# Patient Record
Sex: Female | Born: 1940 | Race: White | Hispanic: No | Smoking: Never smoker
Health system: Southern US, Community
[De-identification: ages and names within clinical notes are randomized; demographics above are authoritative.]

## PROBLEM LIST (undated history)

## (undated) DIAGNOSIS — I10 Essential (primary) hypertension: Secondary | ICD-10-CM

## (undated) DIAGNOSIS — G459 Transient cerebral ischemic attack, unspecified: Secondary | ICD-10-CM

## (undated) DIAGNOSIS — E785 Hyperlipidemia, unspecified: Secondary | ICD-10-CM

## (undated) DIAGNOSIS — F0390 Unspecified dementia without behavioral disturbance: Secondary | ICD-10-CM

## (undated) DIAGNOSIS — G43909 Migraine, unspecified, not intractable, without status migrainosus: Secondary | ICD-10-CM

## (undated) DIAGNOSIS — F419 Anxiety disorder, unspecified: Secondary | ICD-10-CM

## (undated) DIAGNOSIS — I639 Cerebral infarction, unspecified: Secondary | ICD-10-CM

## (undated) DIAGNOSIS — J189 Pneumonia, unspecified organism: Secondary | ICD-10-CM

## (undated) DIAGNOSIS — K219 Gastro-esophageal reflux disease without esophagitis: Secondary | ICD-10-CM

## (undated) DIAGNOSIS — G43109 Migraine with aura, not intractable, without status migrainosus: Secondary | ICD-10-CM

## (undated) HISTORY — DX: Transient cerebral ischemic attack, unspecified: G45.9

## (undated) HISTORY — PX: OVARIAN CYST REMOVAL: SHX89

## (undated) HISTORY — DX: Cerebral infarction, unspecified: I63.9

## (undated) HISTORY — DX: Gastro-esophageal reflux disease without esophagitis: K21.9

## (undated) HISTORY — DX: Hyperlipidemia, unspecified: E78.5

## (undated) HISTORY — DX: Anxiety disorder, unspecified: F41.9

## (undated) HISTORY — DX: Migraine, unspecified, not intractable, without status migrainosus: G43.909

## (undated) HISTORY — PX: CATARACT EXTRACTION: SUR2

## (undated) HISTORY — PX: ABDOMINAL HYSTERECTOMY: SHX81

## (undated) HISTORY — DX: Migraine with aura, not intractable, without status migrainosus: G43.109

## (undated) HISTORY — PX: NASAL SINUS SURGERY: SHX719

---
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
## 1983-08-05 HISTORY — PX: PARTIAL HYSTERECTOMY: SHX80

## 1998-11-27 ENCOUNTER — Ambulatory Visit (HOSPITAL_COMMUNITY): Admission: RE | Admit: 1998-11-27 | Discharge: 1998-11-27 | Payer: Self-pay | Admitting: Obstetrics & Gynecology

## 2003-08-05 HISTORY — PX: KNEE SURGERY: SHX244

## 2005-01-30 ENCOUNTER — Ambulatory Visit: Payer: Self-pay | Admitting: Internal Medicine

## 2005-08-04 HISTORY — PX: ROTATOR CUFF REPAIR: SHX139

## 2005-09-08 ENCOUNTER — Other Ambulatory Visit: Payer: Self-pay

## 2005-09-08 ENCOUNTER — Ambulatory Visit: Payer: Self-pay | Admitting: Orthopaedic Surgery

## 2005-09-15 ENCOUNTER — Ambulatory Visit: Payer: Self-pay | Admitting: Orthopaedic Surgery

## 2006-03-31 ENCOUNTER — Ambulatory Visit: Payer: Self-pay | Admitting: Internal Medicine

## 2006-07-17 ENCOUNTER — Ambulatory Visit: Payer: Self-pay | Admitting: Orthopaedic Surgery

## 2006-09-08 ENCOUNTER — Ambulatory Visit: Payer: Self-pay | Admitting: Orthopaedic Surgery

## 2006-09-08 ENCOUNTER — Other Ambulatory Visit: Payer: Self-pay

## 2006-09-14 ENCOUNTER — Ambulatory Visit: Payer: Self-pay | Admitting: Orthopaedic Surgery

## 2006-11-25 ENCOUNTER — Ambulatory Visit: Payer: Self-pay | Admitting: Orthopaedic Surgery

## 2006-12-18 ENCOUNTER — Ambulatory Visit: Payer: Self-pay | Admitting: Unknown Physician Specialty

## 2007-04-02 ENCOUNTER — Ambulatory Visit: Payer: Self-pay | Admitting: Internal Medicine

## 2007-05-07 ENCOUNTER — Ambulatory Visit: Payer: Self-pay | Admitting: Unknown Physician Specialty

## 2007-06-15 ENCOUNTER — Ambulatory Visit: Payer: Self-pay | Admitting: Unknown Physician Specialty

## 2007-06-29 ENCOUNTER — Ambulatory Visit: Payer: Self-pay | Admitting: Unknown Physician Specialty

## 2007-07-07 ENCOUNTER — Ambulatory Visit: Payer: Self-pay | Admitting: Urology

## 2007-09-05 ENCOUNTER — Inpatient Hospital Stay: Payer: Self-pay | Admitting: Internal Medicine

## 2007-09-05 ENCOUNTER — Other Ambulatory Visit: Payer: Self-pay

## 2007-09-06 ENCOUNTER — Other Ambulatory Visit: Payer: Self-pay

## 2007-09-07 ENCOUNTER — Other Ambulatory Visit: Payer: Self-pay

## 2007-10-11 ENCOUNTER — Ambulatory Visit: Payer: Self-pay | Admitting: Urology

## 2007-10-20 ENCOUNTER — Ambulatory Visit: Payer: Self-pay | Admitting: Urology

## 2008-04-03 ENCOUNTER — Ambulatory Visit: Payer: Self-pay | Admitting: Internal Medicine

## 2008-05-05 ENCOUNTER — Ambulatory Visit (HOSPITAL_COMMUNITY): Admission: RE | Admit: 2008-05-05 | Discharge: 2008-05-05 | Payer: Self-pay | Admitting: Urology

## 2009-04-05 ENCOUNTER — Ambulatory Visit: Payer: Self-pay | Admitting: Internal Medicine

## 2009-04-25 ENCOUNTER — Ambulatory Visit: Payer: Self-pay | Admitting: Ophthalmology

## 2010-03-22 ENCOUNTER — Ambulatory Visit: Payer: Self-pay | Admitting: Otolaryngology

## 2010-04-05 ENCOUNTER — Ambulatory Visit: Payer: Self-pay | Admitting: Internal Medicine

## 2010-04-11 ENCOUNTER — Ambulatory Visit: Payer: Self-pay | Admitting: Otolaryngology

## 2010-04-22 ENCOUNTER — Ambulatory Visit: Payer: Self-pay | Admitting: Unknown Physician Specialty

## 2010-04-23 ENCOUNTER — Ambulatory Visit: Payer: Self-pay | Admitting: Unknown Physician Specialty

## 2010-04-30 ENCOUNTER — Ambulatory Visit: Payer: Self-pay | Admitting: Internal Medicine

## 2010-11-25 ENCOUNTER — Ambulatory Visit: Payer: Self-pay | Admitting: Otolaryngology

## 2011-01-10 ENCOUNTER — Ambulatory Visit: Payer: Self-pay | Admitting: Internal Medicine

## 2011-01-16 ENCOUNTER — Ambulatory Visit: Payer: Self-pay | Admitting: Internal Medicine

## 2011-01-28 DIAGNOSIS — G454 Transient global amnesia: Secondary | ICD-10-CM | POA: Insufficient documentation

## 2011-01-28 DIAGNOSIS — R42 Dizziness and giddiness: Secondary | ICD-10-CM | POA: Insufficient documentation

## 2011-01-28 DIAGNOSIS — R209 Unspecified disturbances of skin sensation: Secondary | ICD-10-CM | POA: Insufficient documentation

## 2011-01-28 DIAGNOSIS — G43409 Hemiplegic migraine, not intractable, without status migrainosus: Secondary | ICD-10-CM | POA: Insufficient documentation

## 2011-01-28 DIAGNOSIS — H9319 Tinnitus, unspecified ear: Secondary | ICD-10-CM | POA: Insufficient documentation

## 2011-02-10 ENCOUNTER — Other Ambulatory Visit: Payer: Self-pay | Admitting: Neurology

## 2011-02-10 DIAGNOSIS — R42 Dizziness and giddiness: Secondary | ICD-10-CM

## 2011-02-10 DIAGNOSIS — G454 Transient global amnesia: Secondary | ICD-10-CM

## 2011-02-10 DIAGNOSIS — H9319 Tinnitus, unspecified ear: Secondary | ICD-10-CM

## 2011-02-10 DIAGNOSIS — G43409 Hemiplegic migraine, not intractable, without status migrainosus: Secondary | ICD-10-CM

## 2011-02-10 DIAGNOSIS — R209 Unspecified disturbances of skin sensation: Secondary | ICD-10-CM

## 2011-02-12 ENCOUNTER — Ambulatory Visit
Admission: RE | Admit: 2011-02-12 | Discharge: 2011-02-12 | Disposition: A | Discharge status: Home / Self Care | Payer: Medicare Other | Source: Ambulatory Visit | Attending: Neurology | Admitting: Neurology

## 2011-02-12 DIAGNOSIS — G43409 Hemiplegic migraine, not intractable, without status migrainosus: Secondary | ICD-10-CM

## 2011-02-12 DIAGNOSIS — R42 Dizziness and giddiness: Secondary | ICD-10-CM

## 2011-02-12 DIAGNOSIS — G454 Transient global amnesia: Secondary | ICD-10-CM

## 2011-02-12 DIAGNOSIS — H9319 Tinnitus, unspecified ear: Secondary | ICD-10-CM

## 2011-02-12 DIAGNOSIS — R209 Unspecified disturbances of skin sensation: Secondary | ICD-10-CM

## 2011-02-12 MED ORDER — GADOBENATE DIMEGLUMINE 529 MG/ML IV SOLN
17.0000 mL | Freq: Once | INTRAVENOUS | Status: AC | PRN
  Administered 2011-02-12: 17 mL via INTRAVENOUS

## 2011-06-30 ENCOUNTER — Ambulatory Visit: Payer: Self-pay | Admitting: Internal Medicine

## 2011-09-08 ENCOUNTER — Ambulatory Visit: Payer: Self-pay | Admitting: Podiatry

## 2011-12-24 DIAGNOSIS — D106 Benign neoplasm of nasopharynx: Secondary | ICD-10-CM | POA: Insufficient documentation

## 2011-12-24 DIAGNOSIS — J309 Allergic rhinitis, unspecified: Secondary | ICD-10-CM | POA: Insufficient documentation

## 2012-07-06 ENCOUNTER — Ambulatory Visit: Payer: Self-pay | Admitting: Internal Medicine

## 2012-12-17 ENCOUNTER — Emergency Department: Payer: Self-pay | Admitting: Emergency Medicine

## 2012-12-17 ENCOUNTER — Encounter (HOSPITAL_COMMUNITY): Payer: Self-pay

## 2012-12-17 ENCOUNTER — Observation Stay (HOSPITAL_COMMUNITY)
Admission: AD | Admit: 2012-12-17 | Discharge: 2012-12-19 | Disposition: A | Discharge status: Home / Self Care | Payer: Medicare Other | Source: Other Acute Inpatient Hospital | Attending: Internal Medicine | Admitting: Internal Medicine

## 2012-12-17 DIAGNOSIS — E785 Hyperlipidemia, unspecified: Secondary | ICD-10-CM | POA: Insufficient documentation

## 2012-12-17 DIAGNOSIS — I10 Essential (primary) hypertension: Secondary | ICD-10-CM | POA: Diagnosis present

## 2012-12-17 DIAGNOSIS — R29810 Facial weakness: Secondary | ICD-10-CM | POA: Insufficient documentation

## 2012-12-17 DIAGNOSIS — Z79899 Other long term (current) drug therapy: Secondary | ICD-10-CM | POA: Insufficient documentation

## 2012-12-17 DIAGNOSIS — R209 Unspecified disturbances of skin sensation: Secondary | ICD-10-CM | POA: Insufficient documentation

## 2012-12-17 DIAGNOSIS — R4789 Other speech disturbances: Secondary | ICD-10-CM | POA: Insufficient documentation

## 2012-12-17 DIAGNOSIS — R51 Headache: Secondary | ICD-10-CM

## 2012-12-17 DIAGNOSIS — I6350 Cerebral infarction due to unspecified occlusion or stenosis of unspecified cerebral artery: Principal | ICD-10-CM | POA: Insufficient documentation

## 2012-12-17 DIAGNOSIS — R29898 Other symptoms and signs involving the musculoskeletal system: Secondary | ICD-10-CM | POA: Insufficient documentation

## 2012-12-17 DIAGNOSIS — G459 Transient cerebral ischemic attack, unspecified: Secondary | ICD-10-CM | POA: Diagnosis present

## 2012-12-17 DIAGNOSIS — I639 Cerebral infarction, unspecified: Secondary | ICD-10-CM

## 2012-12-17 DIAGNOSIS — R519 Headache, unspecified: Secondary | ICD-10-CM | POA: Diagnosis present

## 2012-12-17 HISTORY — DX: Essential (primary) hypertension: I10

## 2012-12-17 HISTORY — DX: Pneumonia, unspecified organism: J18.9

## 2012-12-17 LAB — URINALYSIS, COMPLETE
Bilirubin,UR: NEGATIVE
Blood: NEGATIVE
Glucose,UR: NEGATIVE mg/dL (ref 0–75)
Leukocyte Esterase: NEGATIVE
Nitrite: NEGATIVE
Protein: NEGATIVE
RBC,UR: <1 /HPF (ref 0–5)
Specific Gravity: 1.006 (ref 1.003–1.030)
WBC UR: <1 /HPF (ref 0–5)

## 2012-12-17 LAB — CBC
HCT: 40.1 % (ref 35.0–47.0)
MCH: 27.5 pg (ref 26.0–34.0)
MCV: 83 fL (ref 80–100)
Platelet: 284 10*3/uL (ref 150–440)
RBC: 4.84 10*6/uL (ref 3.80–5.20)
RDW: 14.5 % (ref 11.5–14.5)
WBC: 7.1 10*3/uL (ref 3.6–11.0)

## 2012-12-17 LAB — COMPREHENSIVE METABOLIC PANEL
Albumin: 3.6 g/dL (ref 3.4–5.0)
Alkaline Phosphatase: 95 U/L (ref 50–136)
BUN: 16 mg/dL (ref 7–18)
Co2: 28 mmol/L (ref 21–32)
Osmolality: 283 (ref 275–301)
Potassium: 3.6 mmol/L (ref 3.5–5.1)
SGPT (ALT): 25 U/L (ref 12–78)
Sodium: 141 mmol/L (ref 136–145)
Total Protein: 6.5 g/dL (ref 6.4–8.2)

## 2012-12-17 LAB — TROPONIN I: Troponin-I: <0.02 ng/mL

## 2012-12-17 NOTE — Consult Note (Signed)
Referring Physician: Dr. Suanne Marker    Chief Complaint: Acute onset of left-sided weakness and numbness on slurred speech.  HPI: Jill Parrish is an 72 y.o. female with a history of hypertension who experienced acute onset of right-sided headache with left arm and leg weakness and numbness along with slurred speech at noon today. Has no previous history of stroke nor TIA. Patient's been taking aspirin daily. Deficits subsided for the most part after 30-60 minutes following onset. She still has slight numbness involving left arm. Speech has returned to baseline. CT scan of her head showed no acute intracranial abnormality. She was seen in the emergency room at [facility] and subsequently transferred here for further evaluation and management.  LSN: 12 noon on 12/17/2012 tPA Given: No: Rapid resolution of deficits MRankin: 0  No past medical history on file.  No family history on file.   Medications:  I have reviewed the patient's current medications. Prior to Admission:  Prescriptions prior to admission  Medication Sig Dispense Refill  . AMLODIPINE BESYLATE PO Take 1 tablet by mouth daily.      . Cyanocobalamin (VITAMIN B-12 PO) Take 1 tablet by mouth daily.      Marland Kitchen Dexlansoprazole (DEXILANT PO) Take 1 tablet by mouth daily.      . Melatonin-Pyridoxine (MELATIN PO) Take 1 tablet by mouth at bedtime.      . Nutritional Supplements (JUICE PLUS FIBRE PO) Take 1 packet by mouth daily.      Marland Kitchen omega-3 acid ethyl esters (LOVAZA) 1 G capsule Take 2 g by mouth 2 (two) times daily.      Marland Kitchen OVER THE COUNTER MEDICATION Apply 1 application topically daily. Name of med (Dicl/bac;/bupi/gaba/ibu/pent) 3% cream apply 1-2 pumps (1 to 2 gms) to affected areas 3 to 4 times daily.      . sodium chloride (OCEAN) 0.65 % SOLN nasal spray Place 1 spray into the nose daily as needed for congestion.      Marland Kitchen telmisartan-hydrochlorothiazide (MICARDIS HCT) 80-12.5 MG per tablet Take 1 tablet by mouth daily.          Physical Examination: Blood pressure 185/74, pulse 66, temperature 97.9 F (36.6 C), temperature source Oral, resp. rate 18, height 5' 7.5" (1.715 m), weight 87.771 kg (193 lb 8 oz), SpO2 98.00%.  Neurologic Examination: Mental Status: Alert, oriented, thought content appropriate.  Speech fluent without evidence of aphasia. Able to follow commands without difficulty. Cranial Nerves: II-Visual fields were normal. III/IV/VI-Pupils were equal and reacted. Extraocular movements were full and conjugate.    V/VII-no facial numbness and no facial weakness. VIII-normal. X-normal speech and symmetrical palatal movement. Motor: 5/5 bilaterally with normal tone and bulk Sensory: Normal throughout. Deep Tendon Reflexes: 2+ and symmetric. Plantars: Flexor bilaterally Cerebellar: Normal finger-to-nose testing. Carotid auscultation: Normal  No results found.  Assessment: 72 y.o. female of hypertension he probably experienced right subcortical TIA. Small vessel subcortical infarction cannot be ruled out however.  Stroke Risk Factors - hypertension  Plan: 1. HgbA1c, fasting lipid panel 2. MRI, MRA  of the brain without contrast 3. PT consult, OT consult, Speech consult 4. Echocardiogram 5. Carotid dopplers 6. Prophylactic therapy-Antiplatelet med: Plavix 75 mg/day 7. Risk factor modification 8. Telemetry monitoring   C.R. Roseanne Reno, MD [facility] [phone]  12/17/2012, 10:45 PM

## 2012-12-18 ENCOUNTER — Encounter (HOSPITAL_COMMUNITY): Payer: Self-pay | Admitting: Internal Medicine

## 2012-12-18 ENCOUNTER — Observation Stay (HOSPITAL_COMMUNITY): Payer: Medicare Other

## 2012-12-18 DIAGNOSIS — G459 Transient cerebral ischemic attack, unspecified: Secondary | ICD-10-CM

## 2012-12-18 DIAGNOSIS — R519 Headache, unspecified: Secondary | ICD-10-CM | POA: Diagnosis present

## 2012-12-18 DIAGNOSIS — I10 Essential (primary) hypertension: Secondary | ICD-10-CM | POA: Diagnosis present

## 2012-12-18 DIAGNOSIS — R51 Headache: Secondary | ICD-10-CM | POA: Diagnosis present

## 2012-12-18 HISTORY — DX: Transient cerebral ischemic attack, unspecified: G45.9

## 2012-12-18 LAB — URINALYSIS, ROUTINE W REFLEX MICROSCOPIC
Glucose, UA: NEGATIVE mg/dL
Hgb urine dipstick: NEGATIVE
Leukocytes, UA: NEGATIVE
Specific Gravity, Urine: 1.019 (ref 1.005–1.030)
Urobilinogen, UA: 0.2 mg/dL (ref 0.0–1.0)

## 2012-12-18 LAB — RAPID URINE DRUG SCREEN, HOSP PERFORMED: Benzodiazepines: NOT DETECTED

## 2012-12-18 LAB — HEMOGLOBIN A1C
Hgb A1c MFr Bld: 6.2 % — ABNORMAL HIGH (ref <5.7)
Mean Plasma Glucose: 131 mg/dL — ABNORMAL HIGH (ref <117)

## 2012-12-18 LAB — CBC
HCT: 39.2 % (ref 36.0–46.0)
Hemoglobin: 13.4 g/dL (ref 12.0–15.0)
MCV: 82.9 fL (ref 78.0–100.0)
RBC: 4.73 MIL/uL (ref 3.87–5.11)
WBC: 8.5 10*3/uL (ref 4.0–10.5)

## 2012-12-18 LAB — CREATININE, SERUM
GFR calc Af Amer: >90 mL/min (ref >90)
GFR calc non Af Amer: 85 mL/min — ABNORMAL LOW (ref >90)

## 2012-12-18 LAB — LIPID PANEL
Cholesterol: 240 mg/dL — ABNORMAL HIGH (ref 0–200)
HDL: 58 mg/dL (ref >39)
Total CHOL/HDL Ratio: 4.1 RATIO
Triglycerides: 224 mg/dL — ABNORMAL HIGH (ref <150)

## 2012-12-18 MED ORDER — ACETAMINOPHEN 325 MG PO TABS
650.0000 mg | ORAL_TABLET | ORAL | Status: DC | PRN
  Administered 2012-12-18 – 2012-12-19 (×2): 650 mg via ORAL
  Filled 2012-12-18 (×2): qty 2

## 2012-12-18 MED ORDER — CLOPIDOGREL BISULFATE 75 MG PO TABS
75.0000 mg | ORAL_TABLET | Freq: Every day | ORAL | Status: DC
  Administered 2012-12-18 – 2012-12-19 (×2): 75 mg via ORAL
  Filled 2012-12-18 (×4): qty 1

## 2012-12-18 MED ORDER — TELMISARTAN-HCTZ 80-12.5 MG PO TABS: 1.0000 | ORAL_TABLET | Freq: Every day | ORAL | Status: DC

## 2012-12-18 MED ORDER — ATORVASTATIN CALCIUM 20 MG PO TABS
20.0000 mg | ORAL_TABLET | Freq: Every day | ORAL | Status: DC
  Administered 2012-12-18: 20 mg via ORAL
  Filled 2012-12-18 (×2): qty 1

## 2012-12-18 MED ORDER — OMEGA-3-ACID ETHYL ESTERS 1 G PO CAPS
2.0000 g | ORAL_CAPSULE | Freq: Two times a day (BID) | ORAL | Status: DC
  Administered 2012-12-18 – 2012-12-19 (×3): 2 g via ORAL
  Filled 2012-12-18 (×4): qty 2

## 2012-12-18 MED ORDER — SALINE SPRAY 0.65 % NA SOLN: 1.0000 | Freq: Every day | NASAL | Status: DC | PRN

## 2012-12-18 MED ORDER — PANTOPRAZOLE SODIUM 40 MG PO TBEC
40.0000 mg | DELAYED_RELEASE_TABLET | Freq: Every day | ORAL | Status: DC
  Administered 2012-12-18 – 2012-12-19 (×2): 40 mg via ORAL
  Filled 2012-12-18 (×2): qty 1

## 2012-12-18 MED ORDER — IRBESARTAN 300 MG PO TABS
300.0000 mg | ORAL_TABLET | Freq: Every day | ORAL | Status: DC
  Administered 2012-12-18 – 2012-12-19 (×2): 300 mg via ORAL
  Filled 2012-12-18 (×2): qty 1

## 2012-12-18 MED ORDER — HYDROCHLOROTHIAZIDE 12.5 MG PO CAPS
12.5000 mg | ORAL_CAPSULE | Freq: Every day | ORAL | Status: DC
  Administered 2012-12-18 – 2012-12-19 (×2): 12.5 mg via ORAL
  Filled 2012-12-18 (×2): qty 1

## 2012-12-18 MED ORDER — HEPARIN SODIUM (PORCINE) 5000 UNIT/ML IJ SOLN
5000.0000 [IU] | Freq: Three times a day (TID) | INTRAMUSCULAR | Status: DC
  Administered 2012-12-18 – 2012-12-19 (×4): 5000 [IU] via SUBCUTANEOUS
  Filled 2012-12-18 (×8): qty 1

## 2012-12-18 NOTE — Progress Notes (Addendum)
[facility]  PROGRESS NOTE   I have seen and examined pt admitted by Dr Conley Rolls this am who is a 71yo with h/o HTN and hyperlipidemia that presented to [facility] ER with slurred speech, left sided weakness, and left facial droop. Head CT at [place] was neg, and pt requested transfer to [facility]. Neuro was consulted, await MRI, echo , carotid dopplers. Continue plavix and current management plan as per Dr Conley Rolls, and follow studies and further treat accordingly.  Kela Millin  [facility]  Pager [phone]. If 7PM-7AM, please contact night-coverage at [email or web address], password [facility]  12/18/2012, 12:52 PM LOS: 1 day

## 2012-12-18 NOTE — Progress Notes (Signed)
UR Completed Chia Rock Graves-Bigelow, RN,BSN [phone]  

## 2012-12-18 NOTE — Progress Notes (Signed)
Patient arrived on the floor via EMS from [facility]. Vital signs checked and recorded. Placed patient on telemetry, pt running NSR. Pt admitting doctor paged. Will await for pt admission orders. Will continue to monitor pt.

## 2012-12-18 NOTE — Progress Notes (Signed)
Physical Therapy Evaluation Patient Details Name: Jill Parrish MRN: [id number] DOB: May 16, 1941 Today's Date: 12/18/2012 Time: 2956-2130 PT Time Calculation (min): 32 min  PT Assessment / Plan / Recommendation Clinical Impression  Pt s/p TIA with decr mobility secondary to decr endurance and slight decr balance.  Overall pt functioning close to baseline per pt.  Pt scored 22/24 on DGI suggesting at low risk of falls.  Daughter came in prior to PT leaving and we discussed risk factors for TIA at length and pt and daughter are interested in getting a new BP monitor for home as well as a Life alert.  Pt and daughter seem to understand information given.  Instructed pt and daughter that pt may want to use cane initially upon d/c in the home and communty for safety until she returns fully to baseline.  Pt and daughter agree that no f/u is needed at this time.  Will f/u Monday if pt still in hospital to ensure pt is still progressing and that she is back to baseline.     PT Assessment  Patient needs continued PT services    Follow Up Recommendations  No PT follow up                Equipment Recommendations  None recommended by PT         Frequency Min 3X/week    Precautions / Restrictions Precautions Precautions: None Restrictions Weight Bearing Restrictions: No   Pertinent Vitals/Pain VSS, No pain      Mobility  Bed Mobility Bed Mobility: Rolling Right;Right Sidelying to Sit Rolling Right: 7: Independent Right Sidelying to Sit: 7: Independent Transfers Transfers: Sit to Stand;Stand to Sit Sit to Stand: 7: Independent;From bed Stand to Sit: 7: Independent;To bed Ambulation/Gait Ambulation/Gait Assistance: 5: Supervision Ambulation Distance (Feet): 360 Feet Assistive device: None Ambulation/Gait Assistance Details: Pt with incr weight shift to right LE with larger swing phase on left LE.  No LOB with challenges with pt self correcting with all challenges given.   Gait  Pattern: Step-through pattern;Decreased stride length;Decreased weight shift to right Stairs: No Wheelchair Mobility Wheelchair Mobility: No Modified Rankin (Stroke Patients Only) Pre-Morbid Rankin Score: No symptoms Modified Rankin: No significant disability         PT Diagnosis: Generalized weakness  PT Problem List: Decreased balance PT Treatment Interventions: Gait training;Balance training   PT Goals Acute Rehab PT Goals PT Goal Formulation: With patient Time For Goal Achievement: 12/25/12 Potential to Achieve Goals: Good Pt will Ambulate: >150 feet;with modified independence;with least restrictive assistive device PT Goal: Ambulate - Progress: Goal set today Additional Goals Additional Goal #1: Pt to score >52/56 on Berg. PT Goal: Additional Goal #1 - Progress: Goal set today  Visit Information  Last PT Received On: 12/18/12 Assistance Needed: +1    Subjective Data  Subjective: "I worked out at [employer] two days a week." Patient Stated Goal: to go home   Prior Functioning  Home Living Lives With: Alone Available Help at Discharge: Family;Available PRN/intermittently (daughter close by and neighbors) Type of Home: House Home Access: Stairs to enter [employer] of Steps: 5 Entrance Stairs-Rails: Left Home Layout: Multi-level;Able to live on main level with bedroom/bathroom Alternate Level Stairs-Number of Steps: 10- to get master bedroom Alternate Level Stairs-Rails: Left Bathroom Shower/Tub: Health visitor: Handicapped height Bathroom Accessibility: Yes How Accessible: Accessible via walker Home Adaptive Equipment: Shower chair with back;Straight cane;Grab bars in shower;Hand-held shower hose Prior Function Level of Independence: Independent Able to  Take Stairs?: Yes Driving: Yes Vocation: Retired Comments: Working out with Psychologist, educational 2x week at [employer]: No difficulties Dominant Hand: Right     Cognition  Cognition Arousal/Alertness: Awake/alert Behavior During Therapy: WFL for tasks assessed/performed Overall Cognitive Status: Within Functional Limits for tasks assessed    Extremity/Trunk Assessment Right Lower Extremity Assessment RLE ROM/Strength/Tone: [facility] for tasks assessed Left Lower Extremity Assessment LLE ROM/Strength/Tone: Deficits LLE ROM/Strength/Tone Deficits: Pt reports she wears an orthotic on the left.  Pt definitely swings left LE through with more force than right.   Trunk Assessment Trunk Assessment: Normal   Balance Dynamic Standing Balance Dynamic Standing - Balance Support: No upper extremity supported;During functional activity Dynamic Standing - Level of Assistance: 5: Stand by assistance Dynamic Standing - Balance Activities: Lateral lean/weight shifting;Forward lean/weight shifting;Reaching for objects Dynamic Standing - Comments: No difficulty with dynamic tasks.  Standardized Balance Assessment Standardized Balance Assessment: Dynamic Gait Index Dynamic Gait Index Level Surface: Normal Change in Gait Speed: Normal Gait with Horizontal Head Turns: Mild Impairment Gait with Vertical Head Turns: Normal Gait and Pivot Turn: Normal Step Over Obstacle: Normal Step Around Obstacles: Normal Steps: Mild Impairment Total Score: 22  End of Session PT - End of Session Equipment Utilized During Treatment: Gait belt Activity Tolerance: Patient limited by fatigue Patient left: in bed;with call bell/phone within reach;with family/visitor present Nurse Communication: Mobility status  GP Functional Assessment Tool Used: Clinical judgment Functional Limitation: Mobility: Walking and moving around Mobility: Walking and Moving Around Current Status (Z6109): At least 1 percent but less than 20 percent impaired, limited or restricted Mobility: Walking and Moving Around Goal Status [phone]): 0 percent impaired, limited or restricted   INGOLD,Verdis Koval 12/18/2012,  4:11 PM [facility] [phone] (pager)

## 2012-12-18 NOTE — Progress Notes (Signed)
Stroke Team Progress Note  HISTORY Jill Parrish is an 72 y.o. female with a history of hypertension who experienced acute onset of right-sided headache with left arm and leg weakness and numbness along with slurred speech at noon today. Has no previous history of stroke nor TIA. Patient's been taking aspirin daily. Deficits subsided for the most part after 30-60 minutes following onset. She still has slight numbness involving left arm. Speech has returned to baseline. CT scan of her head showed no acute intracranial abnormality. She was seen in the emergency room at [facility] and subsequently transferred here for further evaluation and management.   Patient was not a TPA candidate secondary to minimal deficit.   SUBJECTIVE The patient feels better, but she has not returned to her baseline. The patient still has some posterior headache on the right. The patient feels that it is difficult getting the words out, and she still has some residual numbness of the left arm.  OBJECTIVE Most recent Vital Signs: Filed Vitals:   12/17/12 2132 12/17/12 2342 12/18/12 0149 12/18/12 0546  BP: 185/74 156/64 151/65 145/62  Pulse: 66 63 61 70  Temp:  97.5 F (36.4 C) 97.8 F (36.6 C) 97.8 F (36.6 C)  TempSrc: Oral Oral Oral Oral  Resp: 18 18 20 20   Height: 5' 7.5" (1.715 m)     Weight: 193 lb 8 oz (87.771 kg)     SpO2: 98% 97% 97% 99%   CBG (last 3)   Recent Labs  12/18/12 0645  GLUCAP 119*    IV Fluid Intake:     MEDICATIONS  . clopidogrel  75 mg Oral Q breakfast  . heparin  5,000 Units Subcutaneous Q8H  . hydrochlorothiazide  12.5 mg Oral Daily  . irbesartan  300 mg Oral Daily  . omega-3 acid ethyl esters  2 g Oral BID  . pantoprazole  40 mg Oral Daily   PRN:  acetaminophen, sodium chloride  Diet:  Cardiac thin liquids Activity:  Bathroom privileges with assistance DVT Prophylaxis:  Heparin SQ  CLINICALLY SIGNIFICANT STUDIES Basic Metabolic Panel:   Recent  Labs Lab 12/18/12 0234  CREATININE 0.69   Liver Function Tests: No results found for this basename: AST, ALT, ALKPHOS, BILITOT, PROT, ALBUMIN,  in the last 168 hours CBC:   Recent Labs Lab 12/18/12 0234  WBC 8.5  HGB 13.4  HCT 39.2  MCV 82.9  PLT 279   Coagulation: No results found for this basename: LABPROT, INR,  in the last 168 hours Cardiac Enzymes: No results found for this basename: CKTOTAL, CKMB, CKMBINDEX, TROPONINI,  in the last 168 hours Urinalysis: No results found for this basename: COLORURINE, APPERANCEUR, LABSPEC, PHURINE, GLUCOSEU, HGBUR, BILIRUBINUR, KETONESUR, PROTEINUR, UROBILINOGEN, NITRITE, LEUKOCYTESUR,  in the last 168 hours Lipid Panel    Component Value Date/Time   CHOL 240* 12/18/2012 0234   TRIG 224* 12/18/2012 0234   HDL 58 12/18/2012 0234   CHOLHDL 4.1 12/18/2012 0234   VLDL 45* 12/18/2012 0234   LDLCALC 137* 12/18/2012 0234   HgbA1C  No results found for this basename: HGBA1C    Urine Drug Screen:     Component Value Date/Time   LABOPIA NONE DETECTED 12/18/2012 0406   COCAINSCRNUR NONE DETECTED 12/18/2012 0406   LABBENZ NONE DETECTED 12/18/2012 0406   AMPHETMU NONE DETECTED 12/18/2012 0406   THCU NONE DETECTED 12/18/2012 0406   LABBARB NONE DETECTED 12/18/2012 0406    Alcohol Level: No results found for this basename: ETH,  in  the last 168 hours  No results found.  CT of the brain   No acute changes, done at [facility]  MRI of the brain    MRA of the brain    2D Echocardiogram    Carotid Doppler    CXR    EKG    Therapy Recommendations Pending   Physical Exam  General: The patient is alert and cooperative at the time of the examination.  Skin: No significant peripheral edema is noted.   Neurologic Exam  Cranial nerves: Facial symmetry is present. Speech is normal, no aphasia or dysarthria is noted.speech may be slightly slow and deliberate. Extraocular movements are full. Visual fields are full.  Motor: The patient has good  strength in all 4 extremities. No drift is seen.  Coordination: The patient has good finger-nose-finger and heel-to-shin bilaterally.  Gait and station: The gait was not tested. No drift is seen.  Reflexes: Deep tendon reflexes are symmetric.    ASSESSMENT Jill Parrish is a 72 y.o. female presenting with left-sided weakness and numbness, slurred speech. On aspirin 81 mg orally every day prior to admission. Now on clopidogrel 75 mg orally every day for secondary stroke prevention. Patient with resultant mild speech changes, left-sided numbness. Work up underway.   Hypertension  Episode of left-sided numbness, weakness, speech changes, possible right subcortical stroke event  LDL 137  Urine drug screen negative  Hospital day # 1  TREATMENT/PLAN  ContinuePlavix  MRI brain  MRA head  2-D echocardiogram  Carotid Doppler study  Physical and occupational therapy evaluation. The patient will likely not need inpatient or outpatient rehabilitation.  Cholesterol lowering agents   12/18/2012 10:12 AM  Lesly Dukes

## 2012-12-18 NOTE — H&P (Signed)
[facility] History and Physical  Jill Parrish ZOX:[id number] DOB: [birth year]    PCP:   None  Chief Complaint: facial droop, slurred speech, and left sided weakness.  HPI: Jill Parrish is an 72 y.o. female with hx HTN, dyslipidemia on [place], presents to [facility] ER with slurred speech, left sided weakness, and left facial droop.  She had a negative head CT and unremarkable CBC with normal Cr, and was accepted in transferance to [facility] for further stroke work up.  She was seen by Dr Roseanne Reno, who felt that this could represent subcortical TIA, and recommended stroke work up.  During my interview with her, she does have trouble with short term memory, but otherwise having fluent speech and was able to converse meaningfully.  She has no residual weakness.  She had HA with these events as well, as she has hx of migraine HA.  Rewiew of Systems:  Constitutional: Negative for malaise, fever and chills. No significant weight loss or weight gain Eyes: Negative for eye pain, redness and discharge, diplopia, visual changes, or flashes of light. ENMT: Negative for ear pain, hoarseness, nasal congestion, sinus pressure and sore throat. No headaches; tinnitus, drooling, or problem swallowing. Cardiovascular: Negative for chest pain, palpitations, diaphoresis, dyspnea and peripheral edema. ; No orthopnea, PND Respiratory: Negative for cough, hemoptysis, wheezing and stridor. No pleuritic chestpain. Gastrointestinal: Negative for nausea, vomiting, diarrhea, constipation, abdominal pain, melena, blood in stool, hematemesis, jaundice and rectal bleeding.    Genitourinary: Negative for frequency, dysuria, incontinence,flank pain and hematuria; Musculoskeletal: Negative for back pain and neck pain. Negative for swelling and trauma.;  Skin: . Negative for pruritus, rash, abrasions, bruising and skin lesion.; ulcerations Neuro: Negative lightheadedness and neck stiffness. Negative for weakness,  altered level of consciousness , altered mental status, burning feet, involuntary movement, seizure and syncope.  Psych: negative for anxiety, depression, insomnia, tearfulness, panic attacks, hallucinations, paranoia, suicidal or homicidal ideation    Past Medical History  Diagnosis Date  . Hypertension   . Pneumonia     Past Surgical History  Procedure Laterality Date  . Partial hysterectomy    . Ovarian cyst removal    . Tonsillectomy      Medications:  HOME MEDS: Prior to Admission medications   Medication Sig Start Date End Date Taking? Authorizing Provider  AMLODIPINE BESYLATE PO Take 1 tablet by mouth daily.   Yes Historical Provider, MD  Cyanocobalamin (VITAMIN B-12 PO) Take 1 tablet by mouth daily.   Yes Historical Provider, MD  Dexlansoprazole (DEXILANT PO) Take 1 tablet by mouth daily.   Yes Historical Provider, MD  Melatonin-Pyridoxine (MELATIN PO) Take 1 tablet by mouth at bedtime.   Yes Historical Provider, MD  Nutritional Supplements (JUICE PLUS FIBRE PO) Take 1 packet by mouth daily.   Yes Historical Provider, MD  omega-3 acid ethyl esters (LOVAZA) 1 G capsule Take 2 g by mouth 2 (two) times daily.   Yes Historical Provider, MD  OVER THE COUNTER MEDICATION Apply 1 application topically daily. Name of med (Dicl/bac;/bupi/gaba/ibu/pent) 3% cream apply 1-2 pumps (1 to 2 gms) to affected areas 3 to 4 times daily.   Yes Historical Provider, MD  sodium chloride (OCEAN) 0.65 % SOLN nasal spray Place 1 spray into the nose daily as needed for congestion.   Yes Historical Provider, MD  telmisartan-hydrochlorothiazide (MICARDIS HCT) 80-12.5 MG per tablet Take 1 tablet by mouth daily.   Yes Historical Provider, MD     Allergies:  Allergies  Allergen Reactions  .  Sulfa Antibiotics     Swelling,itching     Social History:   reports that she has never smoked. She does not have any smokeless tobacco history on file. She reports that  drinks alcohol. She reports that she does  not use illicit drugs.  Family History: History reviewed. No pertinent family history.   Physical Exam: Filed Vitals:   12/17/12 2132 12/17/12 2342 12/18/12 0149  BP: 185/74 156/64 151/65  Pulse: 66 63 61  Temp:  97.5 F (36.4 C) 97.8 F (36.6 C)  TempSrc: Oral Oral Oral  Resp: 18 18 20   Height: 5' 7.5" (1.715 m)    Weight: 87.771 kg (193 lb 8 oz)    SpO2: 98% 97% 97%   Blood pressure 151/65, pulse 61, temperature 97.8 F (36.6 C), temperature source Oral, resp. rate 20, height 5' 7.5" (1.715 m), weight 87.771 kg (193 lb 8 oz), SpO2 97.00%.  GEN:  Pleasant  patient lying in the stretcher in no acute distress; cooperative with exam. PSYCH:  alert and oriented x4; does not appear anxious or depressed; affect is appropriate. HEENT: Mucous membranes pink and anicteric; PERRLA; EOM intact; no cervical lymphadenopathy nor thyromegaly or carotid bruit; no JVD; There were no stridor. Neck is very supple.  She has a slight left facial droop. Breasts:: Not examined CHEST WALL: No tenderness CHEST: Normal respiration, clear to auscultation bilaterally.  HEART: Regular rate and rhythm.  There are no murmur, rub, or gallops.   BACK: No kyphosis or scoliosis; no CVA tenderness ABDOMEN: soft and non-tender; no masses, no organomegaly, normal abdominal bowel sounds; no pannus; no intertriginous candida. There is no rebound and no distention. Rectal Exam: Not done EXTREMITIES: No bone or joint deformity; age-appropriate arthropathy of the hands and knees; no edema; no ulcerations.  There is no calf tenderness. Genitalia: not examined PULSES: 2+ and symmetric SKIN: Normal hydration no rash or ulceration CNS: Cranial nerves 2-12 grossly intact no focal lateralizing neurologic deficit.  Speech is fluent; uvula elevated with phonation, facial symmetry and tongue midline. DTR are normal bilaterally, cerebella exam is intact, barbinski is negative and strengths are equaled bilaterally.  No sensory  loss.   Labs on Admission:  Basic Metabolic Panel:  Recent Labs Lab 12/18/12 0234  CREATININE 0.69   Liver Function Tests: No results found for this basename: AST, ALT, ALKPHOS, BILITOT, PROT, ALBUMIN,  in the last 168 hours No results found for this basename: LIPASE, AMYLASE,  in the last 168 hours No results found for this basename: AMMONIA,  in the last 168 hours CBC:  Recent Labs Lab 12/18/12 0234  WBC 8.5  HGB 13.4  HCT 39.2  MCV 82.9  PLT 279   Cardiac Enzymes: No results found for this basename: CKTOTAL, CKMB, CKMBINDEX, TROPONINI,  in the last 168 hours  CBG: No results found for this basename: GLUCAP,  in the last 168 hours   Radiological Exams on Admission: No results found.  Assessment/Plan Present on Admission:  . TIA (transient ischemic attack) . HTN (hypertension) . Headache  PLAN:  She likely has had a TIA and less likely a CVA.  Will admit her for work up to include MRI/MRA of the brain along with coratid doppler and ECHO.  She has been on ASA, therefore, will change it over to plavix.  She has HTN, and has been on meds.  These were continued.  Her HA is likely a migraine. She is stable, full code, and will be admitted to [facility] service.  [facility] will follow up as well.  Thank you for allowing me to partake in the care of your nice patient.  Other plans as per orders.  Code Status: FULL Unk Lightning, MD. [facility] Pager [phone] 7pm to 7am.  12/18/2012, 3:55 AM

## 2012-12-19 ENCOUNTER — Other Ambulatory Visit: Payer: Self-pay | Admitting: Physician Assistant

## 2012-12-19 DIAGNOSIS — I6350 Cerebral infarction due to unspecified occlusion or stenosis of unspecified cerebral artery: Secondary | ICD-10-CM

## 2012-12-19 DIAGNOSIS — G459 Transient cerebral ischemic attack, unspecified: Secondary | ICD-10-CM

## 2012-12-19 DIAGNOSIS — R51 Headache: Secondary | ICD-10-CM

## 2012-12-19 DIAGNOSIS — I10 Essential (primary) hypertension: Secondary | ICD-10-CM

## 2012-12-19 LAB — URINE CULTURE: Colony Count: 4000

## 2012-12-19 MED ORDER — CLOPIDOGREL BISULFATE 75 MG PO TABS: 75.0000 mg | ORAL_TABLET | Freq: Every day | ORAL | Status: DC

## 2012-12-19 MED ORDER — ATORVASTATIN CALCIUM 20 MG PO TABS: 20.0000 mg | ORAL_TABLET | Freq: Every day | ORAL | Status: DC

## 2012-12-19 NOTE — Evaluation (Signed)
Occupational Therapy Evaluation Patient Details Name: Jill Parrish MRN: [id number] DOB: September 08, 1940 Today's Date: 12/19/2012 Time: 7846-9629 OT Time Calculation (min): 21 min  OT Assessment / Plan / Recommendation Clinical Impression  Pt admitted with acute onset of right-sided headache with left arm and leg weakness and numbness along with slurred speech. CT scan of her head showed no acute intracranial abnormality. She still has slight numbness involving left arm. Speech has returned to baseline.  Educated pt on stroke signs and symptoms.  Pt has necessary level of assist at home and no further DME needs.  Pt does not need any further acute OT services. Will sign off.    OT Assessment  Patient does not need any further OT services    Follow Up Recommendations  No OT follow up    Barriers to Discharge      Equipment Recommendations  None recommended by OT    Recommendations for Other Services    Frequency       Precautions / Restrictions     Pertinent Vitals/Pain See vitals    ADL  Eating/Feeding: Performed;Independent Where Assessed - Eating/Feeding: Edge of bed Grooming: Performed;Brushing hair;Applying makeup;Wash/dry hands;Modified independent Where Assessed - Grooming: Unsupported standing Upper Body Bathing: Simulated;Independent Where Assessed - Upper Body Bathing: Unsupported sitting Lower Body Bathing: Simulated;Modified independent Where Assessed - Lower Body Bathing: Unsupported sit to stand Upper Body Dressing: Performed;Independent Where Assessed - Upper Body Dressing: Unsupported sitting Lower Body Dressing: Performed;Independent Where Assessed - Lower Body Dressing: Unsupported sit to stand Toilet Transfer: Simulated;Modified independent Toilet Transfer Method:  (ambulating) Acupuncturist:  (bed) Toileting - Clothing Manipulation and Hygiene: Simulated;Independent Where Assessed - Toileting Clothing Manipulation and Hygiene: Sit to stand from  3-in-1 or toilet Equipment Used:  (none) Transfers/Ambulation Related to ADLs: mod I level ambulating in room.  No LOB. ADL Comments: Pt just finishing up with sponge bath on OT arrival.   Pt able to don undergarment independently.  Mod I level for fine motor tasks with LUE due to slightly increased time.  Pt reports she can feel that her LUE is not quite to baseline, but it does not prevent her from completing tasks.  Pt does report she feels very tired since she has been in hospital. Recommended use of shower chair for increased safety when showering at home.     OT Diagnosis:    OT Problem List:   OT Treatment Interventions:     OT Goals    Visit Information  Last OT Received On: 12/19/12    Subjective Data      Prior Functioning     Home Living Lives With: Alone Available Help at Discharge: Family;Available PRN/intermittently Type of Home: House Home Access: Stairs to enter [employer] of Steps: 5 Entrance Stairs-Rails: Left Home Layout: Multi-level;Able to live on main level with bedroom/bathroom Alternate Level Stairs-Number of Steps: 10- to get master bedroom Alternate Level Stairs-Rails: Left Bathroom Shower/Tub: Health visitor: Handicapped height Bathroom Accessibility: Yes How Accessible: Accessible via walker Home Adaptive Equipment: Shower chair with back;Straight cane;Grab bars in shower;Hand-held shower hose Prior Function Level of Independence: Independent Able to Take Stairs?: Yes Driving: Yes Vocation: Retired Comments: working out with Psychologist, educational 2x week at [employer] Communication: No difficulties Dominant Hand: Right         Vision/Perception Vision - History Patient Visual Report: No change from baseline   Cognition  Cognition Arousal/Alertness: Awake/alert Behavior During Therapy: WFL for tasks assessed/performed Overall Cognitive Status: Within  Functional Limits for tasks assessed     Extremity/Trunk Assessment Right Upper Extremity Assessment RUE ROM/Strength/Tone: WFL for tasks assessed RUE Sensation: WFL - Light Touch;WFL - Proprioception RUE Coordination: WFL - gross/fine motor Left Upper Extremity Assessment LUE ROM/Strength/Tone: WFL for tasks assessed LUE Sensation: WFL - Proprioception;Deficits LUE Sensation Deficits: reports LUE is "slightly numb" LUE Coordination: Deficits LUE Coordination Deficits: slightly increased time for fine motor tasks.     Mobility Bed Mobility Bed Mobility: Sit to Supine Sit to Supine: 7: Independent Transfers Transfers: Sit to Stand;Stand to Sit Sit to Stand: 7: Independent;From bed Stand to Sit: 7: Independent;To bed     Exercise     Balance     End of Session OT - End of Session Equipment Utilized During Treatment:  (none) Activity Tolerance: Patient tolerated treatment well Patient left: in bed;with call bell/phone within reach Nurse Communication: Mobility status  GO Functional Assessment Tool Used: clinical judgement Functional Limitation: Self care Self Care Current Status (I6962): 0 percent impaired, limited or restricted Self Care Goal Status (X5284): 0 percent impaired, limited or restricted Self Care Discharge Status [phone]): 0 percent impaired, limited or restricted  12/19/2012 Cipriano Mile OTR/L Pager [phone] Office [phone]  Cipriano Mile 12/19/2012, 10:50 AM

## 2012-12-19 NOTE — Progress Notes (Signed)
  Echocardiogram 2D Echocardiogram has been performed.  Jill Parrish 12/19/2012, 9:27 AM

## 2012-12-19 NOTE — Progress Notes (Signed)
Received request through Dr. Myrtis Ser that neurologist Dr. Pearlean Brownie is requesting 21 day cardiac monitor for patient due to TIA. I have left a message on our office's scheduling voicemail requesting this, and our office will call the patient with arrangements. Per Dr. Graciela Husbands, results of monitor will go to him. Dayna Dunn PA-C

## 2012-12-19 NOTE — Discharge Summary (Signed)
Physician Discharge Summary  Jill Parrish NFA:[id number] DOB: [birth year] DOA: 12/17/2012  PCP: No primary provider on file.  Admit date: 12/17/2012 Discharge date: 12/19/2012  Time spent: >61minutes  Recommendations for Outpatient Follow-up:      Follow-up Information   Please follow up. (PCP/Dr Lamb in 1-2weeks, call for appt upon discharge)       Please follow up. 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 female with hx HTN, dyslipidemia on [place], presents to [facility] ER with slurred speech, left sided weakness, and left facial droop. She had a negative head CT and unremarkable CBC with normal Cr, and was accepted in transferance to [facility] for further stroke work up. She was seen by Dr Roseanne Reno, who felt that this could represent subcortical TIA, and recommended stroke work up. During my interview with her, she does have trouble with short term memory, but otherwise having fluent speech and was able to converse meaningfully. She has no residual weakness. She had HA with these events as well, as she has hx of migraine HA.      Hospital Course:  .  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 Neuro was consulted on admission and impression was that she had Probable Right subcortical TIA (transient ischemic attack), and she was changed to plavix from ASA. The pt's symptoms  improved. Carotid doppler did not show significant ICA stenosis, ECHO was done and results pending at this time. Neuro followed up with pt and recommend D/C  With cardionet  Monitor and to follow up with PCP for echo results. I called  Cards- Dr Myrtis Ser re: cardionet monitoring for 3wks and he states that the office will call pt to set up. She is to follow up with Dr Anne Hahn in 4-6wks.   Marland Kitchen HTN (hypertension) -she is to continue her oupt medications upon discharge . Headache  Relieved by tylenol prn, she is to follow up outpt    Procedures:  Echo pending, follow up with PCP for results  Consultations:  Neurology  Discharge Exam: Filed Vitals:   12/18/12 2213 12/19/12 0221 12/19/12 0540 12/19/12 1100  BP: 170/65 144/80 143/64 135/69  Pulse: 67 74 56 65  Temp: 97.7 F (36.5 C) 97.4 F (36.3 C) 97.7 F (36.5 C) 97.8 F (36.6 C)  TempSrc: Oral Oral Oral Oral  Resp: 18 19 20 20   Height:      Weight:      SpO2: 97% 98% 98% 98%    General: alert and oriented x3, in NAD Cardiovascular: RRRR Respiratory: CTAB Extremities: no edema, no cyanosis Neuro- alert and oriented x3, strength nl, sensory grossly intact  Discharge Instructions  Discharge Orders   Future Orders Complete By Expires     Diet - low sodium heart healthy  As directed     Increase activity slowly  As directed  Medication List    TAKE these medications       AMLODIPINE BESYLATE PO  Take 1 tablet by mouth daily.     atorvastatin 20 MG tablet  Commonly known as:  LIPITOR  Take 1 tablet (20 mg total) by mouth daily at 6 PM.     clopidogrel 75 MG tablet  Commonly known as:  PLAVIX  Take 1 tablet (75 mg total) by mouth daily with breakfast.     DEXILANT PO  Take 1 tablet by mouth daily.     JUICE PLUS FIBRE PO  Take 1 packet by mouth daily.     MELATIN PO  Take 1 tablet by mouth at bedtime.     omega-3 acid ethyl esters 1 G capsule  Commonly known as:  LOVAZA  Take 2 g by mouth 2  (two) times daily.     OVER THE COUNTER MEDICATION  Apply 1 application topically daily. Name of med (Dicl/bac;/bupi/gaba/ibu/pent) 3% cream apply 1-2 pumps (1 to 2 gms) to affected areas 3 to 4 times daily.     sodium chloride 0.65 % Soln nasal spray  Commonly known as:  OCEAN  Place 1 spray into the nose daily as needed for congestion.     telmisartan-hydrochlorothiazide 80-12.5 MG per tablet  Commonly known as:  MICARDIS HCT  Take 1 tablet by mouth daily.     VITAMIN B-12 PO  Take 1 tablet by mouth daily.       Allergies  Allergen Reactions  . Sulfa Antibiotics     Swelling,itching        Follow-up Information   Please follow up. (PCP/Dr Lamb in 1-2weeks, call for appt upon discharge)       Please follow up. ([facility]([phone]) to call to set up for Cardionet monitoring )        The results of significant diagnostics from this hospitalization (including imaging, microbiology, ancillary and laboratory) are listed below for reference.    Significant Diagnostic Studies: Mri Brain Without Contrast  12/18/2012   *RADIOLOGY REPORT*  Clinical Data:  Slurred speech with left-sided weakness and left facial droop. Last seen normal 12:00 noon 12/17/2012.  Headache.  MRI HEAD WITHOUT CONTRAST MRA HEAD WITHOUT CONTRAST  Technique:  Multiplanar, multiecho pulse sequences of the brain and surrounding structures were obtained without intravenous contrast. Angiographic images of the head were obtained using MRA technique without contrast.  Comparison:  MRA of the intracranial circulation 02/12/2011.  MRI HEAD  Findings:  There is an acute subcentimeter cortical infarct affecting the left parietal postcentral cortex (image 20 series 4). There is no associated hemorrhage.  No other acute areas of infarction are seen.  There is no acute or chronic hemorrhage, mass lesion, or hydrocephalus.  No extra-axial fluid is present.  Moderate atrophy is advanced for age 35.  There is moderately  extensive chronic microvascular ischemic change affecting the periventricular and subcortical white matter.  Prominent perivascular spaces suggest longstanding hypertension.  Unremarkable pituitary.  No tonsillar herniation.  Upper cervical region unremarkable.  Bilateral cataract extraction.  Extensive sinus exenteration reflecting  treatment for previous fungal sinus disease.  There is moderate fluid accumulation in the left frontal and ethmoid air cells of uncertain significance.  Slight fluid accumulation right division sphenoid noted as well.  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  Atrophy and small vessel disease.  Left frontoethmoid and right sphenoid sinus fluid.  History of sinus exoneration for fungal sinus disease.  MRA HEAD  Findings: Internal carotid arteries are widely patent.  Basilar artery widely patent with vertebrals codominant.  No intracranial stenosis or occlusion.  No intracranial aneurysm.  No evidence for pseudoaneurysm or vessel irregularity.  Similar appearance to priors.  IMPRESSION: Unremarkable MRA intracranial circulation.   Original Report Authenticated By: Davonna Belling, M.D.   Mr Mra Head/brain Wo Cm  12/18/2012   *RADIOLOGY REPORT*  Clinical Data:  Slurred speech with left-sided weakness and left facial droop. Last seen normal 12:00 noon 12/17/2012.  Headache.  MRI HEAD WITHOUT CONTRAST MRA HEAD WITHOUT CONTRAST  Technique:  Multiplanar, multiecho pulse sequences of the brain and surrounding structures were obtained without intravenous contrast. Angiographic images of the head were obtained using MRA technique without contrast.  Comparison:  MRA of the intracranial circulation 02/12/2011.  MRI HEAD  Findings:  There is an acute subcentimeter cortical infarct affecting the left parietal postcentral cortex (image 20 series 4). There is no associated hemorrhage.  No other acute areas of infarction are seen.  There is no acute or chronic hemorrhage, mass  lesion, or hydrocephalus.  No extra-axial fluid is present.  Moderate atrophy is advanced for age 34.  There is moderately extensive chronic microvascular ischemic change affecting the periventricular and subcortical white matter.  Prominent perivascular spaces suggest longstanding hypertension.  Unremarkable pituitary.  No tonsillar herniation.  Upper cervical region unremarkable.  Bilateral cataract extraction.  Extensive sinus exenteration reflecting  treatment for previous fungal sinus disease.  There is moderate fluid accumulation in the left frontal and ethmoid air cells of uncertain significance.  Slight fluid accumulation right division sphenoid noted as well.  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  Atrophy and small vessel disease.  Left frontoethmoid and right sphenoid sinus fluid.  History of sinus exoneration for fungal sinus disease.  MRA HEAD  Findings: Internal carotid arteries are widely patent.  Basilar artery widely patent with vertebrals codominant.  No intracranial stenosis or occlusion.  No intracranial aneurysm.  No evidence for pseudoaneurysm or vessel irregularity.  Similar appearance to priors.  IMPRESSION: Unremarkable MRA intracranial circulation.   Original Report Authenticated By: Davonna Belling, M.D.    Microbiology: No results found for this or any previous visit (from the past 240 hour(s)).   Labs: Basic Metabolic Panel:  Recent Labs Lab 12/18/12 0234  CREATININE 0.69   Liver Function Tests: No results found for this basename: AST, ALT, ALKPHOS, BILITOT, PROT, ALBUMIN,  in the last 168 hours No results found for this basename: LIPASE, AMYLASE,  in the last 168 hours No results found for this basename: AMMONIA,  in the last 168 hours CBC:  Recent Labs Lab 12/18/12 0234  WBC 8.5  HGB 13.4  HCT 39.2  MCV 82.9  PLT 279   Cardiac Enzymes: No results found for this basename: CKTOTAL, CKMB, CKMBINDEX, TROPONINI,  in the last 168  hours BNP: BNP (last 3 results) No results found for this basename: PROBNP,  in the last 8760 hours CBG:  Recent Labs Lab 12/18/12 0645  GLUCAP 119*       Signed:  Maurita Havener C  [facility] 12/19/2012, 12:36 PM

## 2012-12-19 NOTE — Progress Notes (Signed)
Stroke Team Progress Note  HISTORY Jill Parrish is an 72 y.o. female with a history of hypertension who experienced acute onset of right-sided headache with left arm and leg weakness and numbness along with slurred speech at noon today. Has no previous history of stroke nor TIA. Patient's been taking aspirin daily. Deficits subsided for the most part after 30-60 minutes following onset. She still has slight numbness involving left arm. Speech has returned to baseline. CT scan of her head showed no acute intracranial abnormality. She was seen in the emergency room at [facility] and subsequently transferred here for further evaluation and management.   Patient was not a TPA candidate secondary to minimal deficit.   SUBJECTIVE The patient feels better, she feels close to her baseline.  OBJECTIVE Most recent Vital Signs: Filed Vitals:   12/18/12 1849 12/18/12 2213 12/19/12 0221 12/19/12 0540  BP: 140/54 170/65 144/80 143/64  Pulse: 69 67 74 56  Temp: 97.6 F (36.4 C) 97.7 F (36.5 C) 97.4 F (36.3 C) 97.7 F (36.5 C)  TempSrc: Oral Oral Oral Oral  Resp: 17 18 19 20   Height:      Weight:      SpO2: 96% 97% 98% 98%   CBG (last 3)   Recent Labs  12/18/12 0645  GLUCAP 119*    IV Fluid Intake:     MEDICATIONS  . atorvastatin  20 mg Oral q1800  . clopidogrel  75 mg Oral Q breakfast  . heparin  5,000 Units Subcutaneous Q8H  . hydrochlorothiazide  12.5 mg Oral Daily  . irbesartan  300 mg Oral Daily  . omega-3 acid ethyl esters  2 g Oral BID  . pantoprazole  40 mg Oral Daily   PRN:  acetaminophen, sodium chloride  Diet:  Cardiac thin liquids Activity:  Bathroom privileges with assistance DVT Prophylaxis:  Heparin SQ  CLINICALLY SIGNIFICANT STUDIES Basic Metabolic Panel:   Recent Labs Lab 12/18/12 0234  CREATININE 0.69   Liver Function Tests: No results found for this basename: AST, ALT, ALKPHOS, BILITOT, PROT, ALBUMIN,  in the last 168 hours CBC:    Recent Labs Lab 12/18/12 0234  WBC 8.5  HGB 13.4  HCT 39.2  MCV 82.9  PLT 279   Coagulation: No results found for this basename: LABPROT, INR,  in the last 168 hours Cardiac Enzymes: No results found for this basename: CKTOTAL, CKMB, CKMBINDEX, TROPONINI,  in the last 168 hours Urinalysis:   Recent Labs Lab 12/18/12 1409  COLORURINE YELLOW  LABSPEC 1.019  PHURINE 5.0  GLUCOSEU NEGATIVE  HGBUR NEGATIVE  BILIRUBINUR NEGATIVE  KETONESUR NEGATIVE  PROTEINUR NEGATIVE  UROBILINOGEN 0.2  NITRITE NEGATIVE  LEUKOCYTESUR NEGATIVE   Lipid Panel    Component Value Date/Time   CHOL 240* 12/18/2012 0234   TRIG 224* 12/18/2012 0234   HDL 58 12/18/2012 0234   CHOLHDL 4.1 12/18/2012 0234   VLDL 45* 12/18/2012 0234   LDLCALC 137* 12/18/2012 0234   HgbA1C  Lab Results  Component Value Date   HGBA1C 6.2* 12/18/2012    Urine Drug Screen:     Component Value Date/Time   LABOPIA NONE DETECTED 12/18/2012 0406   COCAINSCRNUR NONE DETECTED 12/18/2012 0406   LABBENZ NONE DETECTED 12/18/2012 0406   AMPHETMU NONE DETECTED 12/18/2012 0406   THCU NONE DETECTED 12/18/2012 0406   LABBARB NONE DETECTED 12/18/2012 0406    Alcohol Level: No results found for this basename: ETH,  in the last 168 hours  Mri Brain Without Contrast  12/18/2012   *RADIOLOGY REPORT*  Clinical Data:  Slurred speech with left-sided weakness and left facial droop. Last seen normal 12:00 noon 12/17/2012.  Headache.  MRI HEAD WITHOUT CONTRAST MRA HEAD WITHOUT CONTRAST  Technique:  Multiplanar, multiecho pulse sequences of the brain and surrounding structures were obtained without intravenous contrast. Angiographic images of the head were obtained using MRA technique without contrast.  Comparison:  MRA of the intracranial circulation 02/12/2011.  MRI HEAD  Findings:  There is an acute subcentimeter cortical infarct affecting the left parietal postcentral cortex (image 20 series 4). There is no associated hemorrhage.  No other acute  areas of infarction are seen.  There is no acute or chronic hemorrhage, mass lesion, or hydrocephalus.  No extra-axial fluid is present.  Moderate atrophy is advanced for age 33.  There is moderately extensive chronic microvascular ischemic change affecting the periventricular and subcortical white matter.  Prominent perivascular spaces suggest longstanding hypertension.  Unremarkable pituitary.  No tonsillar herniation.  Upper cervical region unremarkable.  Bilateral cataract extraction.  Extensive sinus exenteration reflecting  treatment for previous fungal sinus disease.  There is moderate fluid accumulation in the left frontal and ethmoid air cells of uncertain significance.  Slight fluid accumulation right division sphenoid noted as well.  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  Atrophy and small vessel disease.  Left frontoethmoid and right sphenoid sinus fluid.  History of sinus exoneration for fungal sinus disease.  MRA HEAD  Findings: Internal carotid arteries are widely patent.  Basilar artery widely patent with vertebrals codominant.  No intracranial stenosis or occlusion.  No intracranial aneurysm.  No evidence for pseudoaneurysm or vessel irregularity.  Similar appearance to priors.  IMPRESSION: Unremarkable MRA intracranial circulation.   Original Report Authenticated By: Davonna Belling, M.D.   Mr Mra Head/brain Wo Cm  12/18/2012   *RADIOLOGY REPORT*  Clinical Data:  Slurred speech with left-sided weakness and left facial droop. Last seen normal 12:00 noon 12/17/2012.  Headache.  MRI HEAD WITHOUT CONTRAST MRA HEAD WITHOUT CONTRAST  Technique:  Multiplanar, multiecho pulse sequences of the brain and surrounding structures were obtained without intravenous contrast. Angiographic images of the head were obtained using MRA technique without contrast.  Comparison:  MRA of the intracranial circulation 02/12/2011.  MRI HEAD  Findings:  There is an acute subcentimeter cortical  infarct affecting the left parietal postcentral cortex (image 20 series 4). There is no associated hemorrhage.  No other acute areas of infarction are seen.  There is no acute or chronic hemorrhage, mass lesion, or hydrocephalus.  No extra-axial fluid is present.  Moderate atrophy is advanced for age 89.  There is moderately extensive chronic microvascular ischemic change affecting the periventricular and subcortical white matter.  Prominent perivascular spaces suggest longstanding hypertension.  Unremarkable pituitary.  No tonsillar herniation.  Upper cervical region unremarkable.  Bilateral cataract extraction.  Extensive sinus exenteration reflecting  treatment for previous fungal sinus disease.  There is moderate fluid accumulation in the left frontal and ethmoid air cells of uncertain significance.  Slight fluid accumulation right division sphenoid noted as well.  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  Atrophy and small vessel disease.  Left frontoethmoid and right sphenoid sinus fluid.  History of sinus exoneration for fungal sinus disease.  MRA HEAD  Findings: Internal carotid arteries are widely patent.  Basilar artery widely patent with vertebrals codominant.  No intracranial stenosis or occlusion.  No intracranial aneurysm.  No evidence for pseudoaneurysm or vessel irregularity.  Similar appearance to priors.  IMPRESSION: Unremarkable MRA intracranial circulation.   Original Report Authenticated By: Davonna Belling, M.D.    CT of the brain   No acute changes, done at [facility]  MRI of the brain   IMPRESSION: Subcentimeter acute infarct in the left parietal cortex. Atrophy and small vessel disease. Left frontoethmoid and right sphenoid sinus fluid. History of sinus exoneration for fungal sinus disease.   MRA of the brain   IMPRESSION: Unremarkable MRA intracranial circulation.   2D Echocardiogram  done, results pending  Carotid Doppler   Carotid Dopplers  completed.  Preliminary report: There is no significant right ICA stenosis. There is <40% left ICA stenosis. Vertebral artery flow is antegrade, bilaterally.   CXR    EKG    Therapy Recommendations Pending   Physical Exam  General: The patient is alert and cooperative at the time of the examination.  Skin: No significant peripheral edema is noted.   Neurologic Exam  Cranial nerves: Facial symmetry is present. Speech is normal, no aphasia or dysarthria is noted.speech may be slightly slow and deliberate. Extraocular movements are full. Visual fields are full.  Motor: The patient has good strength in all 4 extremities. No drift is seen.  Coordination: The patient has good finger-nose-finger and heel-to-shin bilaterally.  Gait and station: The gait was not tested. No drift is seen.  Reflexes: Deep tendon reflexes are symmetric.    ASSESSMENT Jill Parrish is a 72 y.o. female presenting with left-sided weakness and numbness, slurred speech. On aspirin 81 mg orally every day prior to admission. Now on clopidogrel 75 mg orally every day for secondary stroke prevention. Patient with resultant mild speech changes, left-sided numbness. Work up underway.   Hypertension  Episode of left-sided numbness, weakness, speech changes, possible right subcortical stroke event  LDL 137  Urine drug screen negative  Hospital day # 2   The patient is at or near her baseline. Her stroke workup has been completed, the 2-D echocardiogram results are still pending. The patient could be discharged at this point, but I would recommend a CardioNet monitor study to ensure that she is not in atrial fibrillation, as this would alter medical therapy. Otherwise, the patient would remain on antiplatelet agents.  TREATMENT/PLAN  Continue Plavix  2-D echocardiogram, done, results pending  Physical and occupational therapy evaluation done, no outpatient needs  Cholesterol lowering agents  Okay  discharge, would recommend CardioNet monitor as outpatient for 3 weeks.   12/19/2012 10:21 AM  Lesly Dukes

## 2012-12-19 NOTE — Progress Notes (Signed)
VASCULAR LAB PRELIMINARY  PRELIMINARY  PRELIMINARY  PRELIMINARY  Carotid Dopplers completed.    Preliminary report:  There is no significant right ICA stenosis.  There is <40% left ICA stenosis.  Vertebral artery flow is antegrade, bilaterally.  Jess Sulak, RVT 12/19/2012, 9:24 AM

## 2012-12-19 NOTE — Progress Notes (Signed)
Pt. Educated  and questions answered during d/c.  Provided Rx. IV d/c pt. Tolerated well.   Thane Edu, RN

## 2012-12-23 ENCOUNTER — Other Ambulatory Visit: Payer: Self-pay | Admitting: Nurse Practitioner

## 2012-12-23 DIAGNOSIS — R42 Dizziness and giddiness: Secondary | ICD-10-CM

## 2012-12-24 ENCOUNTER — Encounter (INDEPENDENT_AMBULATORY_CARE_PROVIDER_SITE_OTHER): Payer: Medicare Other

## 2012-12-24 ENCOUNTER — Encounter: Payer: Self-pay | Admitting: *Deleted

## 2012-12-24 DIAGNOSIS — G459 Transient cerebral ischemic attack, unspecified: Secondary | ICD-10-CM

## 2012-12-24 DIAGNOSIS — R42 Dizziness and giddiness: Secondary | ICD-10-CM

## 2012-12-24 NOTE — Progress Notes (Signed)
Patient ID: Jill Parrish, female   DOB: 1940/09/16, 72 y.o.   MRN: [id number] 21 Day e-cardio monitor applied to patient for diagnosis of dizziness, (in the setting of a TIA to rule out atrial fibrillation).  Requested by Dr. Pearlean Brownie for Dr. Graciela Husbands to review.

## 2013-02-21 ENCOUNTER — Encounter: Payer: Self-pay | Admitting: Neurology

## 2013-02-21 ENCOUNTER — Ambulatory Visit (INDEPENDENT_AMBULATORY_CARE_PROVIDER_SITE_OTHER): Payer: Medicare Other | Admitting: Neurology

## 2013-02-21 VITALS — BP 143/79 | HR 75 | Ht 75.0 in | Wt 196.0 lb

## 2013-02-21 DIAGNOSIS — H9319 Tinnitus, unspecified ear: Secondary | ICD-10-CM

## 2013-02-21 DIAGNOSIS — I6340 Cerebral infarction due to embolism of unspecified cerebral artery: Secondary | ICD-10-CM

## 2013-02-21 DIAGNOSIS — G454 Transient global amnesia: Secondary | ICD-10-CM

## 2013-02-21 DIAGNOSIS — G43409 Hemiplegic migraine, not intractable, without status migrainosus: Secondary | ICD-10-CM

## 2013-02-21 DIAGNOSIS — R42 Dizziness and giddiness: Secondary | ICD-10-CM

## 2013-02-21 DIAGNOSIS — G459 Transient cerebral ischemic attack, unspecified: Secondary | ICD-10-CM

## 2013-02-21 DIAGNOSIS — R209 Unspecified disturbances of skin sensation: Secondary | ICD-10-CM

## 2013-02-21 NOTE — Progress Notes (Signed)
Reason for visit: Stroke  Jill Parrish is an 72 y.o. female  History of present illness:  Jill Parrish is a 72 year old right-handed white female with a history of migraine headaches and dizziness. The patient was seen through the emergency room on 12/17/2012 with left-sided weakness and numbness, and slurred speech, and a right-sided headache. MRI evaluation showed a punctate left posterior infarct that was cortical in nature. MRA of the head was unremarkable. The patient underwent a carotid Doppler study showing no stenosis on the right, on the left the patient had 40-59% stenosis. A 2-D echocardiogram was unremarkable. The patient reports episodes of occasional palpitations, and she has had 4 or 5 episodes of brief syncope only while reading in bed. The patient denies any chest pain or palpitations around this time, and the syncope is only a fraction of a second. The patient has undergone a CardioNet monitor study for 3 weeks that was unremarkable, showing only occasional PVCs. The patient returns to this office for further evaluation. The patient is now on Plavix.  Past Medical History  Diagnosis Date  . Hypertension   . Pneumonia   . Migraine headache     with neurologic features  . Dyslipidemia   . Anxiety disorder   . GERD (gastroesophageal reflux disease)   . TIA (transient ischemic attack) 12/18/12  . Migraine headache with aura   . Stroke     Left brain, posterior    Past Surgical History  Procedure Laterality Date  . Partial hysterectomy  1985  . Ovarian cyst removal    . Tonsillectomy  1947  . Nasal sinus surgery    . Rotator cuff repair Right 2007    arthroscopy  . Cataract extraction Bilateral   . Knee surgery Right 2005    Family History  Problem Relation Age of Onset  . Stroke Mother   . Heart attack Father   . Breast cancer Sister   . Aneurysm Sister     Abdominal aortic aneurysm  . Stroke Maternal Aunt   . Stroke Maternal Grandfather   . Heart disease  Paternal Grandmother   . Heart disease Paternal Grandfather     Social history:  reports that she has never smoked. She does not have any smokeless tobacco history on file. She reports that  drinks alcohol. She reports that she does not use illicit drugs.  Allergies:  Allergies  Allergen Reactions  . Minocin (Minocycline Hcl)   . Sulfa Antibiotics     Swelling,itching   . Keflex (Cephalexin)     Medications:  Current Outpatient Prescriptions on File Prior to Visit  Medication Sig Dispense Refill  . amLODipine (NORVASC) 5 MG tablet Take 5 mg by mouth daily.      . clopidogrel (PLAVIX) 75 MG tablet Take 1 tablet (75 mg total) by mouth daily with breakfast.  30 tablet  0  . Cyanocobalamin (VITAMIN B-12 PO) Take 1 tablet by mouth daily.      Marland Kitchen dexlansoprazole (DEXILANT) 60 MG capsule Take 60 mg by mouth daily.      . Melatonin-Pyridoxine (MELATIN PO) Take 1 tablet by mouth at bedtime.      . Nutritional Supplements (JUICE PLUS FIBRE PO) Take 1 packet by mouth daily.      Marland Kitchen omega-3 acid ethyl esters (LOVAZA) 1 G capsule Take 2 g by mouth 2 (two) times daily.      Marland Kitchen OVER THE COUNTER MEDICATION Apply 1 application topically daily. Name of med (Dicl/bac;/bupi/gaba/ibu/pent) 3% cream  apply 1-2 pumps (1 to 2 gms) to affected areas 3 to 4 times daily.      . sodium chloride (OCEAN) 0.65 % SOLN nasal spray Place 1 spray into the nose daily as needed for congestion.      Marland Kitchen telmisartan-hydrochlorothiazide (MICARDIS HCT) 80-12.5 MG per tablet Take 1 tablet by mouth daily.       No current facility-administered medications on file prior to visit.    ROS:  Out of a complete 14 system review of symptoms, the patient complains only of the following symptoms, and all other reviewed systems are negative.  Fatigue Palpitations of the heart Ringing in the ear on the right Moles Easy bruising, easy bleeding Increased thirst Achy muscles Allergies Headache Decreased energy Insomnia  Blood  pressure 143/79, pulse 75, height 6\' 3"  (1.905 m), weight 196 lb (88.905 kg).  Physical Exam  General: The patient is alert and cooperative at the time of the examination.  Skin: No significant peripheral edema is noted.   Neurologic Exam  Cranial nerves: Facial symmetry is present. Speech is normal, no aphasia or dysarthria is noted. Extraocular movements are full. Visual fields are full.  Motor: The patient has good strength in all 4 extremities.  Coordination: The patient has good finger-nose-finger and heel-to-shin bilaterally.  Gait and station: The patient has a normal gait. Tandem gait is normal. Romberg is negative. No drift is seen.  Reflexes: Deep tendon reflexes are symmetric.   Assessment/Plan:  1. Cerebrovascular disease, recent punctate left brain stroke  2. Migraine headache  3. Brief syncope  The patient has had several brief episodes of loss of consciousness only while reading in bed. No etiology for this has been found. It is possible that the patient may be having brief rapid onset sleep episodes instead. The patient has had a cerebrovascular workup, and we will check a transcranial Doppler bubble study as well to rule out a PFO. The patient is on Plavix. The patient will followup if needed.  Jill Palau MD 02/21/2013 7:12 PM  [facility] [address] [place], [place] [address]  Phone [phone] Fax [phone]

## 2013-02-22 ENCOUNTER — Other Ambulatory Visit: Payer: Self-pay | Admitting: Neurology

## 2013-02-22 DIAGNOSIS — I6340 Cerebral infarction due to embolism of unspecified cerebral artery: Secondary | ICD-10-CM

## 2013-03-25 ENCOUNTER — Other Ambulatory Visit: Payer: Medicare Other

## 2013-03-25 ENCOUNTER — Ambulatory Visit: Payer: Medicare Other | Admitting: Neurology

## 2013-04-06 ENCOUNTER — Ambulatory Visit (INDEPENDENT_AMBULATORY_CARE_PROVIDER_SITE_OTHER): Payer: Self-pay

## 2013-04-06 ENCOUNTER — Ambulatory Visit (INDEPENDENT_AMBULATORY_CARE_PROVIDER_SITE_OTHER): Payer: Medicare Other | Admitting: Neurology

## 2013-04-06 ENCOUNTER — Encounter: Payer: Self-pay | Admitting: Neurology

## 2013-04-06 VITALS — BP 144/84 | HR 84 | Ht 75.0 in | Wt 196.0 lb

## 2013-04-06 DIAGNOSIS — I6350 Cerebral infarction due to unspecified occlusion or stenosis of unspecified cerebral artery: Secondary | ICD-10-CM

## 2013-04-06 DIAGNOSIS — Z0289 Encounter for other administrative examinations: Secondary | ICD-10-CM

## 2013-04-06 DIAGNOSIS — I6340 Cerebral infarction due to embolism of unspecified cerebral artery: Secondary | ICD-10-CM

## 2013-04-06 MED ORDER — ASPIRIN-DIPYRIDAMOLE ER 25-200 MG PO CP12: 1.0000 | ORAL_CAPSULE | Freq: Two times a day (BID) | ORAL | Status: DC

## 2013-04-06 NOTE — Progress Notes (Signed)
TCD Bubble study was negative. Kindly see separate procedure report.

## 2013-04-06 NOTE — Addendum Note (Signed)
Addended by: Salome Spotted on: 04/06/2013 04:20 PM   Modules accepted: Medications

## 2013-06-09 ENCOUNTER — Other Ambulatory Visit: Payer: Self-pay

## 2013-07-06 ENCOUNTER — Telehealth: Payer: Self-pay | Admitting: *Deleted

## 2013-07-06 NOTE — Telephone Encounter (Signed)
Jamie with [facility] calling about medication review form faxed, asking if do not have then to refax.  Pt of Dr. Pearlean Brownie.  (looking at notes, it is a pt of Dr. Anne Hahn).  I LMVM for pharmacist about this and need form and gave fax # [phone].

## 2013-07-13 NOTE — Telephone Encounter (Signed)
I called and spoke to [facility], they will fax to [place] about the reccomendations, have Dr. Pearlean Brownie to review, deny or accept then fax back.  Gave her [phone].

## 2013-07-15 ENCOUNTER — Telehealth: Payer: Self-pay | Admitting: Neurology

## 2013-07-15 NOTE — Telephone Encounter (Signed)
I called patient. I got a notification from [place] [facility] that the patient was having trouble with Aggrenox. The patient indicates that she had a fall about a month ago, and she had some bleeding complications. The patient could not take Plavix because she had side effects on her, and she could not take Aggrenox in full dose because of side effects. The patient was taking one Aggrenox daily. The patient has gone off of all antiplatelet agents after the fall as she was bleeding following a fall. At this point, the patient is healed up, and I have indicated that she could go back on a baby aspirin daily.

## 2013-07-22 ENCOUNTER — Emergency Department: Payer: Self-pay | Admitting: Emergency Medicine

## 2013-07-22 LAB — COMPREHENSIVE METABOLIC PANEL
Anion Gap: 4 — ABNORMAL LOW (ref 7–16)
Bilirubin,Total: 0.5 mg/dL (ref 0.2–1.0)
Chloride: 99 mmol/L (ref 98–107)
EGFR (Non-African Amer.): 58 — ABNORMAL LOW
Osmolality: 271 (ref 275–301)
Potassium: 4.5 mmol/L (ref 3.5–5.1)
SGOT(AST): 67 U/L — ABNORMAL HIGH (ref 15–37)
SGPT (ALT): 42 U/L (ref 12–78)
Sodium: 131 mmol/L — ABNORMAL LOW (ref 136–145)
Total Protein: 6.7 g/dL (ref 6.4–8.2)

## 2013-07-22 LAB — CBC
HCT: 43 % (ref 35.0–47.0)
HGB: 14.4 g/dL (ref 12.0–16.0)
MCH: 28.2 pg (ref 26.0–34.0)
MCHC: 33.4 g/dL (ref 32.0–36.0)
MCV: 84 fL (ref 80–100)
Platelet: 229 10*3/uL (ref 150–440)
RDW: 13.9 % (ref 11.5–14.5)
WBC: 5 10*3/uL (ref 3.6–11.0)

## 2013-07-22 LAB — URINALYSIS, COMPLETE
Bilirubin,UR: NEGATIVE
Blood: NEGATIVE
Hyaline Cast: 16
Leukocyte Esterase: NEGATIVE
Nitrite: NEGATIVE
Ph: 5 (ref 4.5–8.0)

## 2013-07-22 LAB — RAPID INFLUENZA A&B ANTIGENS

## 2013-07-22 LAB — TROPONIN I: Troponin-I: <0.02 ng/mL

## 2013-08-16 ENCOUNTER — Ambulatory Visit: Payer: Self-pay | Admitting: Family Medicine

## 2013-11-14 ENCOUNTER — Ambulatory Visit: Payer: Self-pay | Admitting: Vascular Surgery

## 2014-02-11 DIAGNOSIS — J3489 Other specified disorders of nose and nasal sinuses: Secondary | ICD-10-CM | POA: Insufficient documentation

## 2014-03-20 DIAGNOSIS — I10 Essential (primary) hypertension: Secondary | ICD-10-CM | POA: Insufficient documentation

## 2014-03-20 DIAGNOSIS — R7301 Impaired fasting glucose: Secondary | ICD-10-CM | POA: Insufficient documentation

## 2014-04-14 ENCOUNTER — Other Ambulatory Visit: Payer: Self-pay | Admitting: Otolaryngology

## 2014-04-14 DIAGNOSIS — H903 Sensorineural hearing loss, bilateral: Secondary | ICD-10-CM

## 2014-04-14 DIAGNOSIS — H905 Unspecified sensorineural hearing loss: Secondary | ICD-10-CM

## 2014-04-21 ENCOUNTER — Ambulatory Visit
Admission: RE | Admit: 2014-04-21 | Discharge: 2014-04-21 | Disposition: A | Discharge status: Home / Self Care | Payer: Medicare Other | Source: Ambulatory Visit | Attending: Otolaryngology | Admitting: Otolaryngology

## 2014-04-21 DIAGNOSIS — H905 Unspecified sensorineural hearing loss: Secondary | ICD-10-CM

## 2014-04-21 DIAGNOSIS — H903 Sensorineural hearing loss, bilateral: Secondary | ICD-10-CM

## 2014-04-21 MED ORDER — GADOBENATE DIMEGLUMINE 529 MG/ML IV SOLN: 17.0000 mL | Freq: Once | INTRAVENOUS | Status: AC | PRN

## 2014-08-17 ENCOUNTER — Ambulatory Visit: Payer: Self-pay | Admitting: Family Medicine

## 2014-09-21 DIAGNOSIS — E7800 Pure hypercholesterolemia, unspecified: Secondary | ICD-10-CM | POA: Insufficient documentation

## 2014-09-21 DIAGNOSIS — R7301 Impaired fasting glucose: Secondary | ICD-10-CM | POA: Insufficient documentation

## 2014-12-26 ENCOUNTER — Encounter: Payer: Self-pay | Admitting: Radiology

## 2014-12-26 ENCOUNTER — Emergency Department: Payer: Medicare Other

## 2014-12-26 ENCOUNTER — Inpatient Hospital Stay
Admission: EM | Admit: 2014-12-26 | Discharge: 2014-12-30 | DRG: 872 | Disposition: A | Discharge status: Home / Self Care | Payer: Medicare Other | Attending: Specialist | Admitting: Specialist

## 2014-12-26 DIAGNOSIS — K5732 Diverticulitis of large intestine without perforation or abscess without bleeding: Secondary | ICD-10-CM | POA: Diagnosis present

## 2014-12-26 DIAGNOSIS — Z881 Allergy status to other antibiotic agents status: Secondary | ICD-10-CM

## 2014-12-26 DIAGNOSIS — A419 Sepsis, unspecified organism: Secondary | ICD-10-CM | POA: Diagnosis present

## 2014-12-26 DIAGNOSIS — I10 Essential (primary) hypertension: Secondary | ICD-10-CM | POA: Diagnosis present

## 2014-12-26 DIAGNOSIS — F419 Anxiety disorder, unspecified: Secondary | ICD-10-CM | POA: Diagnosis present

## 2014-12-26 DIAGNOSIS — Z882 Allergy status to sulfonamides status: Secondary | ICD-10-CM | POA: Diagnosis not present

## 2014-12-26 DIAGNOSIS — E785 Hyperlipidemia, unspecified: Secondary | ICD-10-CM | POA: Diagnosis present

## 2014-12-26 DIAGNOSIS — Z7982 Long term (current) use of aspirin: Secondary | ICD-10-CM

## 2014-12-26 DIAGNOSIS — Z8673 Personal history of transient ischemic attack (TIA), and cerebral infarction without residual deficits: Secondary | ICD-10-CM | POA: Diagnosis not present

## 2014-12-26 DIAGNOSIS — Z79899 Other long term (current) drug therapy: Secondary | ICD-10-CM | POA: Diagnosis not present

## 2014-12-26 DIAGNOSIS — K5792 Diverticulitis of intestine, part unspecified, without perforation or abscess without bleeding: Secondary | ICD-10-CM

## 2014-12-26 DIAGNOSIS — K219 Gastro-esophageal reflux disease without esophagitis: Secondary | ICD-10-CM | POA: Diagnosis present

## 2014-12-26 LAB — CBC WITH DIFFERENTIAL/PLATELET
Basophils Absolute: 0 10*3/uL (ref 0–0.1)
Basophils Relative: 0 %
EOS PCT: 0 %
Eosinophils Absolute: 0 10*3/uL (ref 0–0.7)
HCT: 41.6 % (ref 35.0–47.0)
Hemoglobin: 14.1 g/dL (ref 12.0–16.0)
Lymphocytes Relative: 6 %
Lymphs Abs: 1 10*3/uL (ref 1.0–3.6)
MCH: 30.1 pg (ref 26.0–34.0)
MCHC: 34 g/dL (ref 32.0–36.0)
MCV: 88.7 fL (ref 80.0–100.0)
Monocytes Absolute: 1.1 10*3/uL — ABNORMAL HIGH (ref 0.2–0.9)
Monocytes Relative: 7 %
NEUTROS PCT: 87 %
Neutro Abs: 14 10*3/uL — ABNORMAL HIGH (ref 1.4–6.5)
Platelets: 260 10*3/uL (ref 150–440)
RBC: 4.69 MIL/uL (ref 3.80–5.20)
RDW: 13.3 % (ref 11.5–14.5)
WBC: 16.2 10*3/uL — AB (ref 3.6–11.0)

## 2014-12-26 LAB — URINALYSIS COMPLETE WITH MICROSCOPIC (ARMC ONLY)
Bacteria, UA: NONE SEEN
Bilirubin Urine: NEGATIVE
Glucose, UA: NEGATIVE mg/dL
Hgb urine dipstick: NEGATIVE
LEUKOCYTES UA: NEGATIVE
Nitrite: NEGATIVE
Protein, ur: 100 mg/dL — AB
RBC / HPF: NONE SEEN RBC/hpf (ref 0–5)
SPECIFIC GRAVITY, URINE: 1.024 (ref 1.005–1.030)
pH: 5 (ref 5.0–8.0)

## 2014-12-26 LAB — COMPREHENSIVE METABOLIC PANEL
ALK PHOS: 83 U/L (ref 38–126)
ALT: 21 U/L (ref 14–54)
ANION GAP: 10 (ref 5–15)
AST: 22 U/L (ref 15–41)
Albumin: 3.6 g/dL (ref 3.5–5.0)
BILIRUBIN TOTAL: 1 mg/dL (ref 0.3–1.2)
BUN: 18 mg/dL (ref 6–20)
CALCIUM: 8.7 mg/dL — AB (ref 8.9–10.3)
CHLORIDE: 99 mmol/L — AB (ref 101–111)
CO2: 28 mmol/L (ref 22–32)
Creatinine, Ser: 0.83 mg/dL (ref 0.44–1.00)
GFR calc Af Amer: >60 mL/min (ref >60)
GFR calc non Af Amer: >60 mL/min (ref >60)
Glucose, Bld: 175 mg/dL — ABNORMAL HIGH (ref 65–99)
Potassium: 3.3 mmol/L — ABNORMAL LOW (ref 3.5–5.1)
Sodium: 137 mmol/L (ref 135–145)
TOTAL PROTEIN: 6.7 g/dL (ref 6.5–8.1)

## 2014-12-26 LAB — LACTIC ACID, PLASMA
LACTIC ACID, VENOUS: 1.4 mmol/L (ref 0.5–2.0)
Lactic Acid, Venous: 1.4 mmol/L (ref 0.5–2.0)

## 2014-12-26 LAB — TROPONIN I

## 2014-12-26 LAB — LIPASE, BLOOD: Lipase: 23 U/L (ref 22–51)

## 2014-12-26 MED ORDER — ACETAMINOPHEN 325 MG PO TABS
ORAL_TABLET | ORAL | Status: AC
  Administered 2014-12-26: 1000 mg via ORAL
  Filled 2014-12-26: qty 3

## 2014-12-26 MED ORDER — ACETAMINOPHEN 500 MG PO TABS
1000.0000 mg | ORAL_TABLET | Freq: Once | ORAL | Status: AC
  Administered 2014-12-26: 1000 mg via ORAL

## 2014-12-26 MED ORDER — PIPERACILLIN-TAZOBACTAM 3.375 G IVPB: 3.3750 g | Freq: Four times a day (QID) | INTRAVENOUS | Status: DC

## 2014-12-26 MED ORDER — SODIUM CHLORIDE 0.9 % IV BOLUS (SEPSIS)
1000.0000 mL | Freq: Once | INTRAVENOUS | Status: AC
  Administered 2014-12-26: 1000 mL via INTRAVENOUS

## 2014-12-26 MED ORDER — ACETAMINOPHEN 650 MG RE SUPP: 650.0000 mg | Freq: Four times a day (QID) | RECTAL | Status: DC | PRN

## 2014-12-26 MED ORDER — VANCOMYCIN HCL IN DEXTROSE 1-5 GM/200ML-% IV SOLN
1000.0000 mg | Freq: Once | INTRAVENOUS | Status: AC
  Administered 2014-12-26: 1000 mg via INTRAVENOUS

## 2014-12-26 MED ORDER — MORPHINE SULFATE 4 MG/ML IJ SOLN
4.0000 mg | Freq: Once | INTRAMUSCULAR | Status: AC
  Administered 2014-12-26: 4 mg via INTRAVENOUS

## 2014-12-26 MED ORDER — HEPARIN SODIUM (PORCINE) 5000 UNIT/ML IJ SOLN
5000.0000 [IU] | Freq: Three times a day (TID) | INTRAMUSCULAR | Status: DC
  Administered 2014-12-26 – 2014-12-29 (×7): 5000 [IU] via SUBCUTANEOUS
  Filled 2014-12-26 (×9): qty 1

## 2014-12-26 MED ORDER — MORPHINE SULFATE 2 MG/ML IJ SOLN
2.0000 mg | INTRAMUSCULAR | Status: DC | PRN
  Administered 2014-12-26 – 2014-12-29 (×6): 2 mg via INTRAVENOUS
  Filled 2014-12-26 (×5): qty 1

## 2014-12-26 MED ORDER — MORPHINE SULFATE 4 MG/ML IJ SOLN
INTRAMUSCULAR | Status: AC
  Administered 2014-12-26: 4 mg via INTRAVENOUS
  Filled 2014-12-26: qty 1

## 2014-12-26 MED ORDER — FENTANYL CITRATE (PF) 100 MCG/2ML IJ SOLN
50.0000 ug | Freq: Once | INTRAMUSCULAR | Status: AC
  Administered 2014-12-26: 50 ug via INTRAVENOUS

## 2014-12-26 MED ORDER — PIPERACILLIN-TAZOBACTAM 3.375 G IVPB
3.3750 g | Freq: Three times a day (TID) | INTRAVENOUS | Status: DC
  Administered 2014-12-26 – 2014-12-30 (×11): 3.375 g via INTRAVENOUS
  Filled 2014-12-26 (×15): qty 50

## 2014-12-26 MED ORDER — PANTOPRAZOLE SODIUM 40 MG PO TBEC
40.0000 mg | DELAYED_RELEASE_TABLET | Freq: Every day | ORAL | Status: DC
  Administered 2014-12-27 – 2014-12-30 (×4): 40 mg via ORAL
  Filled 2014-12-26 (×4): qty 1

## 2014-12-26 MED ORDER — ONDANSETRON HCL 4 MG/2ML IJ SOLN
4.0000 mg | Freq: Once | INTRAMUSCULAR | Status: AC
  Administered 2014-12-26: 4 mg via INTRAVENOUS

## 2014-12-26 MED ORDER — IOHEXOL 300 MG/ML  SOLN
100.0000 mL | Freq: Once | INTRAMUSCULAR | Status: AC | PRN
  Administered 2014-12-26: 100 mL via INTRAVENOUS

## 2014-12-26 MED ORDER — MORPHINE SULFATE 2 MG/ML IJ SOLN
INTRAMUSCULAR | Status: AC
  Administered 2014-12-26: 2 mg via INTRAVENOUS
  Filled 2014-12-26: qty 1

## 2014-12-26 MED ORDER — VANCOMYCIN HCL IN DEXTROSE 1-5 GM/200ML-% IV SOLN
INTRAVENOUS | Status: AC
  Filled 2014-12-26: qty 200

## 2014-12-26 MED ORDER — PIPERACILLIN-TAZOBACTAM 3.375 G IVPB: 3.3750 g | Freq: Once | INTRAVENOUS | Status: DC

## 2014-12-26 MED ORDER — ROSUVASTATIN CALCIUM 5 MG PO TABS
5.0000 mg | ORAL_TABLET | ORAL | Status: DC
  Administered 2014-12-29: 10:00:00 5 mg via ORAL
  Filled 2014-12-26 (×2): qty 1

## 2014-12-26 MED ORDER — ACETAMINOPHEN 325 MG PO TABS: 650.0000 mg | ORAL_TABLET | Freq: Four times a day (QID) | ORAL | Status: DC | PRN

## 2014-12-26 MED ORDER — IOHEXOL 240 MG/ML SOLN: 25.0000 mL | Freq: Once | INTRAMUSCULAR | Status: AC | PRN

## 2014-12-26 MED ORDER — SODIUM CHLORIDE 0.9 % IV SOLN
INTRAVENOUS | Status: DC
  Administered 2014-12-26 – 2014-12-29 (×6): via INTRAVENOUS

## 2014-12-26 MED ORDER — PIPERACILLIN-TAZOBACTAM 3.375 G IVPB
3.3750 g | Freq: Once | INTRAVENOUS | Status: AC
  Administered 2014-12-26: 3.375 g via INTRAVENOUS

## 2014-12-26 MED ORDER — ONDANSETRON HCL 4 MG/2ML IJ SOLN: 4.0000 mg | Freq: Four times a day (QID) | INTRAMUSCULAR | Status: DC | PRN

## 2014-12-26 MED ORDER — MORPHINE SULFATE 4 MG/ML IJ SOLN
4.0000 mg | Freq: Once | INTRAMUSCULAR | Status: AC
Start: 2014-12-26 — End: 2014-12-26
  Administered 2014-12-26: 22:00:00 4 mg via INTRAVENOUS
  Filled 2014-12-26: qty 1

## 2014-12-26 MED ORDER — SODIUM CHLORIDE 0.9 % IV BOLUS (SEPSIS)
1559.0000 mL | Freq: Once | INTRAVENOUS | Status: AC
  Administered 2014-12-26: 1000 mL via INTRAVENOUS

## 2014-12-26 MED ORDER — AMLODIPINE BESYLATE 5 MG PO TABS
5.0000 mg | ORAL_TABLET | Freq: Every day | ORAL | Status: DC
  Administered 2014-12-27 – 2014-12-30 (×4): 5 mg via ORAL
  Filled 2014-12-26 (×4): qty 1

## 2014-12-26 MED ORDER — ASPIRIN 81 MG PO TABS
81.0000 mg | ORAL_TABLET | Freq: Every day | ORAL | Status: DC
  Administered 2014-12-27: 10:00:00 81 mg via ORAL
  Filled 2014-12-26 (×5): qty 1

## 2014-12-26 MED ORDER — ONDANSETRON HCL 4 MG PO TABS: 4.0000 mg | ORAL_TABLET | ORAL | Status: DC | PRN

## 2014-12-26 MED ORDER — MELATONIN-PYRIDOXINE 3-1 MG PO TABS: 3.0000 mg | ORAL_TABLET | Freq: Every day | ORAL | Status: DC

## 2014-12-26 MED ORDER — FENTANYL CITRATE (PF) 100 MCG/2ML IJ SOLN
INTRAMUSCULAR | Status: AC
  Administered 2014-12-26: 50 ug via INTRAVENOUS
  Filled 2014-12-26: qty 2

## 2014-12-26 MED ORDER — PIPERACILLIN-TAZOBACTAM 3.375 G IVPB
INTRAVENOUS | Status: AC
  Administered 2014-12-26: 3.375 g via INTRAVENOUS
  Filled 2014-12-26: qty 50

## 2014-12-26 MED ORDER — ONDANSETRON HCL 4 MG/2ML IJ SOLN
INTRAMUSCULAR | Status: AC
  Administered 2014-12-26: 4 mg via INTRAVENOUS
  Filled 2014-12-26: qty 2

## 2014-12-26 NOTE — ED Provider Notes (Signed)
[facility] Emergency Department Provider Note  ____________________________________________  Time seen: Approximately 4:07 PM  I have reviewed the triage vital signs and the nursing notes.   HISTORY  Chief Complaint Abdominal Pain    HPI Jill Parrish is a 74 y.o. female with history of hypertension, hyperlipidemia, migraines presents for evaluation of gradual onset constant aching diffuse abdominal pain that began approximately one week ago. It has worsened in severity. Currently the pain is severe. She has also had several episodes of nonbloody nonbilious emesis since yesterday. No diarrhea. She reports she has not a bowel movement in 2 days. No modifying factors.   Past Medical History  Diagnosis Date  . Hypertension   . Pneumonia   . Migraine headache     with neurologic features  . Dyslipidemia   . Anxiety disorder   . GERD (gastroesophageal reflux disease)   . TIA (transient ischemic attack) 12/18/12  . Migraine headache with aura   . Stroke     Left brain, posterior    Patient Active Problem List   Diagnosis Date Noted  . TIA (transient ischemic attack) 12/18/2012  . HTN (hypertension) 12/18/2012  . Headache(784.0) 12/18/2012  . Unspecified tinnitus 01/28/2011  . Disturbance of skin sensation 01/28/2011  . Dizziness and giddiness 01/28/2011  . 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 Partial hysterectomy  1985  . Ovarian cyst removal    . Tonsillectomy  1947  . Nasal sinus surgery    . Rotator cuff repair Right 2007    arthroscopy  . Cataract extraction Bilateral   . Knee surgery Right 2005    Current Outpatient Rx  Name  Route  Sig  Dispense  Refill  . Acetaminophen (TYLENOL EXTRA STRENGTH PO)   Oral   Take by mouth. As needed         . amLODipine (NORVASC) 5  MG tablet   Oral   Take 5 mg by mouth daily.         Marland Kitchen aspirin 81 MG tablet   Oral   Take 81 mg by mouth daily.         . Cyanocobalamin (VITAMIN B-12 PO)   Oral   Take 1 tablet by mouth daily.         Marland Kitchen dexlansoprazole (DEXILANT) 60 MG capsule   Oral   Take 60 mg by mouth daily.         . Diclofenac Sodium (VOLTAREN EX)   Apply externally   Apply topically. As needed         . Melatonin-Pyridoxine (MELATIN PO)   Oral   Take 1 tablet by mouth at bedtime.         . Misc Natural Products (OSTEO BI-FLEX JOINT SHIELD PO)   Oral   Take by mouth. As needed         . Nutritional Supplements (JUICE PLUS FIBRE PO)   Oral   Take 1 packet by mouth daily.         Marland Kitchen omega-3 acid ethyl esters (LOVAZA) 1 G capsule   Oral   Take 2 g by mouth 2 (two) times daily.         Marland Kitchen OVER THE COUNTER MEDICATION   Topical   Apply 1 application topically daily. Name of med (Dicl/bac;/bupi/gaba/ibu/pent) 3% cream apply 1-2 pumps (1 to 2 gms)  to affected areas 3 to 4 times daily.         . pravastatin (PRAVACHOL) 20 MG tablet               . PREVIDENT 5000 SENSITIVE 1.1-5 % PSTE               . rosuvastatin (CRESTOR) 5 MG tablet   Oral   Take 5 mg by mouth 3 (three) times a week.         . sodium chloride (OCEAN) 0.65 % SOLN nasal spray   Nasal   Place 1 spray into the nose daily as needed for congestion.         . SUMAtriptan (IMITREX) 100 MG tablet   Oral   Take 100 mg by mouth daily at 2 PM daily at 2 PM. 1-2 times daily as needed         . telmisartan-hydrochlorothiazide (MICARDIS HCT) 80-12.5 MG per tablet   Oral   Take 1 tablet by mouth daily.         . Vitamin Mixture (VITAMIN E COMPLETE PO)   Oral   Take by mouth. 1 time daily           Allergies Minocin; Sulfa antibiotics; and Keflex  Family History  Problem Relation Age of Onset  . Stroke Mother   . Heart attack Father   . Breast cancer Sister   . Aneurysm Sister     Abdominal  aortic aneurysm  . Stroke Maternal Aunt   . Stroke Maternal Grandfather   . Heart disease Paternal Grandmother   . Heart disease Paternal Grandfather     Social History History  Substance Use Topics  . Smoking status: Never Smoker   . Smokeless tobacco: Not on file  . Alcohol Use: Yes     Comment: glass of red wine ocassionally    Review of Systems Constitutional: No fever/chills Eyes: No visual changes. ENT: No sore throat. Cardiovascular: Denies chest pain. Respiratory: Denies shortness of breath. Gastrointestinal: + abdominal pain.  + nausea, + vomiting.  No diarrhea.  No constipation. Genitourinary: Negative for dysuria. Musculoskeletal: Negative for back pain. Skin: Negative for rash. Neurological: Negative for headaches, focal weakness or numbness.  10-point ROS otherwise negative.  ____________________________________________   PHYSICAL EXAM:   Filed Vitals:   12/26/14 1611 12/26/14 1730 12/26/14 2008  BP: 135/89 141/64 121/49  Pulse: 94 79 77  Temp: 102.2 F (39 C) 99.9 F (37.7 C) 98.3 F (36.8 C)  TempSrc: Oral Oral   Resp: 21 20 16   Height: 5\' 7"  (1.702 m)    Weight: 188 lb (85.276 kg)    SpO2: 96% 94% 95%      Constitutional: Alert and oriented. Well appearing and in no acute distress. Eyes: Conjunctivae are normal. PERRL. EOMI. Head: Atraumatic. Nose: No congestion/rhinnorhea. Mouth/Throat: Mucous membranes are slightly dry.  Oropharynx non-erythematous. Neck: No stridor.   Cardiovascular: Normal rate, regular rhythm. Grossly normal heart sounds.  Good peripheral circulation. Respiratory: Normal respiratory effort.  No retractions. Lungs CTAB. Gastrointestinal: + bowel sounds moderate diffuse tenderness, worse throughout the lower abdomen. No abdominal bruits. No CVA tenderness. Genitourinary: deferred Musculoskeletal: No lower extremity tenderness nor edema.  No joint effusions. Neurologic:  Normal speech and language. No gross focal  neurologic deficits are appreciated. Speech is normal. No gait instability. Skin:  Skin is warm, dry and intact. No rash noted. Psychiatric: Mood and affect are normal. Speech and behavior are normal.  ____________________________________________  LABS (all labs ordered are listed, but only abnormal results are displayed)  Labs Reviewed  CBC WITH DIFFERENTIAL/PLATELET - Abnormal; Notable for the following:    WBC 16.2 (*)    Neutro Abs 14.0 (*)    Monocytes Absolute 1.1 (*)    All other components within normal limits  COMPREHENSIVE METABOLIC PANEL - Abnormal; Notable for the following:    Potassium 3.3 (*)    Chloride 99 (*)    Glucose, Bld 175 (*)    Calcium 8.7 (*)    All other components within normal limits  URINALYSIS COMPLETEWITH MICROSCOPIC ([facility])  - Abnormal; Notable for the following:    Color, Urine AMBER (*)    APPearance HAZY (*)    Ketones, ur 1+ (*)    Protein, ur 100 (*)    Squamous Epithelial / LPF 0-5 (*)    All other components within normal limits  CULTURE, BLOOD (ROUTINE X 2)  CULTURE, BLOOD (ROUTINE X 2)  LIPASE, BLOOD  TROPONIN I  LACTIC ACID, PLASMA  LACTIC ACID, PLASMA   ____________________________________________  EKG  ED ECG REPORT I, Joanne Gavel, the attending physician, personally viewed and interpreted this ECG.   Date: 12/26/2014  EKG Time: 16:14  Rate: 90  Rhythm: normal sinus rhythm  Axis: Normal  Intervals: Normal intervals  ST&T Change: No acute ST segment elevation, left ventricular hypertrophy with repolarization abnormality  ____________________________________________  RADIOLOGY  CT Abdomen and Pelvis  IMPRESSION: 1. Findings compatible with acute severe though uncomplicated diverticulitis involving the sigmoid colon. Several prominent gas-filled diverticuli are noted arising from the sigmoid colon, though there is no definitive definable/drainable fluid collection. A loop of mid small bowel is secondarily  involved with the inflammatory process within the midline of the lower pelvis though does not definitely result in enteric obstruction. 2. Interval increase in size of indeterminate exophytic nodules arising from the caudal aspect of the right lobe of the liver with dominant nodule now measuring approximately 4.1 cm, previously poorly visualized though measuring approximately 3 cm in hind site. Further evaluation with nonemergent abdominal MRI is recommended for lesion characterization. 3. Unchanged approximately 3.6 cm left-sided complex renal cyst (Bosniak class 2), unchanged since the 04/2010 examination. 4. Unchanged approximately 2.3 cm adenoma within medial limb of the left adrenal gland, unchanged since the 04/2010 examination. ____________________________________________   PROCEDURES  Procedure(s) performed: None  Critical Care performed: Yes, see critical care note(s). Total critical care time spent 40 minutes.  ____________________________________________   INITIAL IMPRESSION / ASSESSMENT AND PLAN / ED COURSE  Pertinent labs & imaging results that were available during my care of the patient were reviewed by me and considered in my medical decision making (see chart for details).   Jill Parrish is a 74 y.o. female with history of hypertension, lipidemia, migraines presents for evaluation of gradual onset constant aching diffuse abdominal pain that began approximately one week ago. On arrival, she is febrile, maintaining adequate blood pressure with diffuse tenderness. Plan for basic labs, urinalysis, CT of the abdomen and pelvis, screening EKG. Concern for possible intra-abdominal infection, Vanc and Zosyn given.  IV fluids ordered given +2/4 SIRS criteria.  ----------------------------------------- 8:58 PM on 12/26/2014 -----------------------------------------  CT of the abdomen and pelvis shows findings consistent with diverticulitis without abscess. Blood  pressure stable. Pain improved. Discussed with hospitalist for admission.  ____________________________________________   FINAL CLINICAL IMPRESSION(S) / ED DIAGNOSES  Final diagnoses:  Sepsis, due to unspecified organism  Diverticulitis of large intestine without perforation or  abscess without bleeding      Joanne Gavel, MD 12/26/14 [phone]

## 2014-12-26 NOTE — H&P (Signed)
[facility] [place] NAME: Jill Parrish    MR#:  [id number]  DATE OF BIRTH:  06/22/1941   DATE OF ADMISSION:  12/26/2014  PRIMARY CARE PHYSICIAN: BABAOFF, Caryl Bis, MD   REQUESTING/REFERRING PHYSICIAN: Edd Fabian  CHIEF COMPLAINT:   Chief Complaint  Patient presents with  . Abdominal Pain    HISTORY OF PRESENT ILLNESS:  Jill Parrish  is a 74 y.o. female with a known history of hypertension essential presenting with abdominal pain. She describes one week total duration of abdominal pain lower abdomen in location, radiating throughout the abdomen, sharp in quality, 7-8 out of 10 intensity, no worsening or relieving factors. Her symptoms of an progressively worsening over the week thus prompted her presentation to the hospital. She describes having associated fevers, chills, nausea, vomiting.  PAST MEDICAL HISTORY:   Past Medical History  Diagnosis Date  . Hypertension   . Pneumonia   . Migraine headache     with neurologic features  . Dyslipidemia   . Anxiety disorder   . GERD (gastroesophageal reflux disease)   . TIA (transient ischemic attack) 12/18/12  . Migraine headache with aura   . Stroke     Left brain, posterior    PAST SURGICAL HISTORY:   Past Surgical History  Procedure Laterality Date  . Partial hysterectomy  1985  . Ovarian cyst removal    . Tonsillectomy  1947  . Nasal sinus surgery    . Rotator cuff repair Right 2007    arthroscopy  . Cataract extraction Bilateral   . Knee surgery Right 2005    SOCIAL HISTORY:   History  Substance Use Topics  . Smoking status: Never Smoker   . Smokeless tobacco: Not on file  . Alcohol Use: Yes     Comment: glass of red wine ocassionally    FAMILY HISTORY:   Family History  Problem Relation Age of Onset  . Stroke Mother   . Heart attack Father   . Breast cancer Sister   . Aneurysm Sister     Abdominal aortic aneurysm  . Stroke Maternal Aunt   . Stroke  Maternal Grandfather   . Heart disease Paternal Grandmother   . Heart disease Paternal Grandfather     DRUG ALLERGIES:   Allergies  Allergen Reactions  . Minocin [Minocycline Hcl] Itching and Rash  . Sulfa Antibiotics Itching and Swelling  . Keflex [Cephalexin] Itching    REVIEW OF SYSTEMS:  REVIEW OF SYSTEMS:  CONSTITUTIONAL: Positive fevers, chills, fatigue, weakness.  EYES: Denies blurred vision, double vision, or eye pain.  EARS, NOSE, THROAT: Denies tinnitus, ear pain, hearing loss.  RESPIRATORY: denies cough, shortness of breath, wheezing  CARDIOVASCULAR: Denies chest pain, palpitations, edema.  GASTROINTESTINAL: Positive nausea, vomiting, , abdominal pain.  GENITOURINARY: Denies dysuria, hematuria.  ENDOCRINE: Denies nocturia or thyroid problems. HEMATOLOGIC AND LYMPHATIC: Denies easy bruising or bleeding.  SKIN: Denies rash or lesions.  MUSCULOSKELETAL: Denies pain in neck, back, shoulder, knees, hips, or further arthritic symptoms.  NEUROLOGIC: Denies paralysis, paresthesias.  PSYCHIATRIC: Denies anxiety or depressive symptoms. Otherwise full review of systems performed by me is negative.   MEDICATIONS AT HOME:   Prior to Admission medications   Medication Sig Start Date End Date Taking? Authorizing Provider  acetaminophen (TYLENOL) 500 MG tablet Take 500 mg by mouth every 6 (six) hours as needed for mild pain or headache.   Yes Historical Provider, MD  amLODipine (NORVASC) 5 MG tablet Take 5  mg by mouth daily.   Yes Historical Provider, MD  aspirin EC 81 MG tablet Take 81 mg by mouth daily.   Yes Historical Provider, MD  dexlansoprazole (DEXILANT) 60 MG capsule Take 60 mg by mouth daily.   Yes Historical Provider, MD  diclofenac sodium (VOLTAREN) 1 % GEL Apply 2 g topically as needed (for pain).   Yes Historical Provider, MD  MELATONIN PO Take 1 tablet by mouth at bedtime as needed (for sleep).   Yes Historical Provider, MD  Nutritional Supplements (JUICE PLUS  FIBRE PO) Take 1 capsule by mouth daily.    Yes Historical Provider, MD  OVER THE COUNTER MEDICATION Apply 1-2 application topically as needed (for pain). Pt uses a 3% cream that contains diclofenac, baclofen, bupivacaine, gabapentin, ibuprofen, and pentazocine.   Yes Historical Provider, MD  PREVIDENT 5000 SENSITIVE 1.1-5 % PSTE Take 1 application by mouth 2 (two) times daily.  03/30/13  Yes Historical Provider, MD  ranitidine (ZANTAC) 150 MG tablet Take 150 mg by mouth 2 (two) times daily as needed for heartburn.   Yes Historical Provider, MD  sodium chloride (OCEAN) 0.65 % SOLN nasal spray Place 1 spray into the nose daily as needed for congestion.   Yes Historical Provider, MD  telmisartan-hydrochlorothiazide (MICARDIS HCT) 80-12.5 MG per tablet Take 1 tablet by mouth daily.   Yes Historical Provider, MD      VITAL SIGNS:  Blood pressure 122/50, pulse 70, temperature 98.3 F (36.8 C), temperature source Oral, resp. rate 11, height _0  (1.702 m), weight 188 lb (85.276 kg), SpO2 95 %.  PHYSICAL EXAMINATION:  VITAL SIGNS: Filed Vitals:   12/26/14 2045  BP:   Pulse: 70  Temp:   Resp: 11   GENERAL:73 y.o.female currently in no acute distress.  HEAD: Normocephalic, atraumatic.  EYES: Pupils equal, round, reactive to light. Extraocular muscles intact. No scleral icterus.  MOUTH: Moist mucosal membrane. Dentition intact. No abscess noted.  EAR, NOSE, THROAT: Clear without exudates. No external lesions.  NECK: Supple. No thyromegaly. No nodules. No JVD.  PULMONARY: Clear to ascultation, without wheeze rails or rhonci. No use of accessory muscles, Good respiratory effort. good air entry bilaterally CHEST: Nontender to palpation.  CARDIOVASCULAR: S1 and S2. Regular rate and rhythm. No murmurs, rubs, or gallops. No edema. Pedal pulses 2+ bilaterally.  GASTROINTESTINAL: Soft, minimal tenderness to palpation left lower quadrant without rebound or guarding, nondistended. No masses. Positive  bowel sounds. No hepatosplenomegaly.  MUSCULOSKELETAL: No swelling, clubbing, or edema. Range of motion full in all extremities.  NEUROLOGIC: Cranial nerves II through XII are intact. No gross focal neurological deficits. Sensation intact. Reflexes intact.  SKIN: No ulceration, lesions, rashes, or cyanosis. Skin warm and dry. Turgor intact.  PSYCHIATRIC: Mood, affect within normal limits. The patient is awake, alert and oriented x 3. Insight, judgment intact.    LABORATORY PANEL:   CBC  Recent Labs Lab 12/26/14 1630  WBC 16.2*  HGB 14.1  HCT 41.6  PLT 260   ------------------------------------------------------------------------------------------------------------------  Chemistries   Recent Labs Lab 12/26/14 1630  NA 137  K 3.3*  CL 99*  CO2 28  GLUCOSE 175*  BUN 18  CREATININE 0.83  CALCIUM 8.7*  AST 22  ALT 21  ALKPHOS 83  BILITOT 1.0   ------------------------------------------------------------------------------------------------------------------  Cardiac Enzymes  Recent Labs Lab 12/26/14 1630  TROPONINI <0.03   ------------------------------------------------------------------------------------------------------------------  RADIOLOGY:  Ct Abdomen Pelvis W Contrast  12/26/2014   CLINICAL DATA:  Lower abdominal pain for 1 week, now  with bilateral lower quadrant abdominal pain.  EXAM: CT ABDOMEN AND PELVIS WITH CONTRAST  TECHNIQUE: Multidetector CT imaging of the abdomen and pelvis was performed using the standard protocol following bolus administration of intravenous contrast.  CONTRAST:  166mL OMNIPAQUE IOHEXOL 300 MG/ML  SOLN  COMPARISON:  07/22/2013  FINDINGS: Colonic diverticulosis with extensive ill-defined mesenteric stranding about the distal sigmoid colon worrisome for acute diverticulitis. There is a large (at least 2.2 cm) fluid containing diverticulum arising from the anterior cranial aspect of the sigmoid colon (representative images 72 through  75, series 2). There is no definitive definable/drainable fluid collection. A loop of mid small bowel appears to be secondarily involved within the lower pelvis (representative images 69 and 70, series 2). No evidence of enteric obstruction.  The cecum is noted to be ectopically located within the right mid hemi abdomen. The bowel is otherwise normal in course and caliber without wall thickening. Normal appearance of the appendix and terminal ileum. No pneumoperitoneum, pneumatosis or portal venous gas.  Normal hepatic contour. Interval increase in size of exophytic nodules arising from the caudal aspect of the right lobe of the liver with dominant nodule now measuring approximately 4.1 x 2.8 cm, previously, 3.0 x 1.8 cm an additional satellite nodule measuring approximately 2.4 cm in diameter, previously 1 cm. Otherwise, normal hepatic contour. No additional hepatic lesions identified. Normal appearance of the gallbladder. No radiopaque gallstones. No intra extrahepatic biliary duct dilatation. No ascites.  There is symmetric enhancement and excretion of the bilateral kidneys. Unchanged approximately 3.6 cm partially exophytic cyst arising from the anterior interpolar aspect of the left kidney with associated mural calcifications (image 36, series 2), grossly unchanged since the 04/2010 examination. No discrete right-sided renal lesions. No renal stones urinary obstruction. No perinephric stranding. Unchanged approximately 2.3 x 1.6 cm adenoma within in the medial limb of the left adrenal gland (image 21, series 2) unchanged since the 04/2010 examination. Normal appearance of the right adrenal gland, pancreas and spleen.  Extensive calcified atherosclerotic plaque within a normal caliber abdominal aorta. The major branch vessels of the abdominal aorta appear widely patent on this non CTA examination. Scattered shotty retroperitoneal lymph nodes individually not enlarged by size criteria. No retroperitoneal,  mesenteric, pelvic or inguinal lymphadenopathy.  Post hysterectomy. No discrete adnexal lesion. Normal appearance of the urinary bladder given degree distention.  Limited visualization of lower thorax demonstrates minimal dependent subpleural ground-glass atelectasis, right greater than left. No focal airspace opacities in.  Normal heart size.  No pericardial effusion.  No acute or aggressive osseous abnormalities. Moderate severe multilevel lumbar spine DDD, worse at L3-L4 and L5-S1 with disc space height loss, endplate irregularity and sclerosis. Mild presumably degenerative rotatory scoliotic curvature of the lumbar spine.  Small mesenteric fat containing periumbilical hernia.  IMPRESSION: 1. Findings compatible with acute severe though uncomplicated diverticulitis involving the sigmoid colon. Several prominent gas-filled diverticuli are noted arising from the sigmoid colon, though there is no definitive definable/drainable fluid collection. A loop of mid small bowel is secondarily involved with the inflammatory process within the midline of the lower pelvis though does not definitely result in enteric obstruction. 2. Interval increase in size of indeterminate exophytic nodules arising from the caudal aspect of the right lobe of the liver with dominant nodule now measuring approximately 4.1 cm, previously poorly visualized though measuring approximately 3 cm in hind site. Further evaluation with nonemergent abdominal MRI is recommended for lesion characterization. 3. Unchanged approximately 3.6 cm left-sided complex renal cyst (Bosniak class 2),  unchanged since the 04/2010 examination. 4. Unchanged approximately 2.3 cm adenoma within medial limb of the left adrenal gland, unchanged since the 04/2010 examination.   Electronically Signed   By: Sandi Mariscal M.D.   On: 12/26/2014 20:08    EKG:   Orders placed or performed during the hospital encounter of 12/26/14  . ED EKG  . ED EKG    IMPRESSION AND PLAN:    74 year old Caucasian female history of essential hypertension presenting with abdominal pain  1. Sepsis, meeting septic criteria by heart rate, leukocytosis present on arrival. Source acute diverticulitis Code sepsis not met. Panculture. Broad-spectrum antibiotics including Zosyn and taper antibiotics when culture data returns. HContinue IV fluid hydration to keep mean arterial pressure greater than 65.We will repeat lactic acid given the initial is greater than 2.2.  2. Acute diverticulitis, uncomplicated: IV fluid hydration, minimize by mouth intake, antibiotics as above, provide pain medication 3. Essential hypertension: Hold diuretics, continue Norvasc 4. Gastroesophageal reflux disease without esophagitis: PPI therapy 5.Venous thromboembolism prophylactic: Heparin subcutaneous     All the records are reviewed and case discussed with ED provider. Management plans discussed with the patient, family and they are in agreement.  CODE STATUS: Full  TOTAL TIME TAKING CARE OF THIS PATIENT: 35 minutes.    Lovinia Snare,  Karenann Cai.D on 12/26/2014 at 9:38 PM  Between 7am to 6pm - Pager - [phone]  After 6pm: House Pager: - [phone]  [facility]  Office  [phone]  CC: Primary care physician; Marcello Fennel, MD

## 2014-12-26 NOTE — Progress Notes (Signed)
ANTIBIOTIC CONSULT NOTE - INITIAL 3 Pharmacy Consult for Zosyn Indication: Diverticulitis  Allergies  Allergen Reactions  . Minocin [Minocycline Hcl] Itching and Rash  . Sulfa Antibiotics Itching and Swelling  . Keflex [Cephalexin] Itching    Patient Measurements: Height: 5\' 7"  (170.2 cm) Weight: 188 lb (85.276 kg) IBW/kg (Calculated) : 61.6   Vital Signs: Temp: 98.3 F (36.8 C) (05/24 2008) Temp Source: Oral (05/24 1730) BP: 122/50 mmHg (05/24 2030) Pulse Rate: 70 (05/24 2045) Intake/Output from previous day:   Intake/Output from this shift:    Labs:  Recent Labs  12/26/14 1630  WBC 16.2*  HGB 14.1  PLT 260  CREATININE 0.83   Estimated Creatinine Clearance: 66.7 mL/min (by C-G formula based on Cr of 0.83). No results for input(s): VANCOTROUGH, VANCOPEAK, VANCORANDOM, GENTTROUGH, GENTPEAK, GENTRANDOM, TOBRATROUGH, TOBRAPEAK, TOBRARND, AMIKACINPEAK, AMIKACINTROU, AMIKACIN in the last 72 hours.   Microbiology: No results found for this or any previous visit (from the past 720 hour(s)).  Medical History: Past Medical History  Diagnosis Date  . Hypertension   . Pneumonia   . Migraine headache     with neurologic features  . Dyslipidemia   . Anxiety disorder   . GERD (gastroesophageal reflux disease)   . TIA (transient ischemic attack) 12/18/12  . Migraine headache with aura   . Stroke     Left brain, posterior    Medications:  Anti-infectives    Start     Dose/Rate Route Frequency Ordered Stop   12/27/14 0000  piperacillin-tazobactam (ZOSYN) IVPB 3.375 g  Status:  Discontinued     3.375 g 12.5 mL/hr over 240 Minutes Intravenous 4 times per day 12/26/14 2059 12/26/14 2112   12/26/14 2330  piperacillin-tazobactam (ZOSYN) IVPB 3.375 g     3.375 g 12.5 mL/hr over 240 Minutes Intravenous 3 times per day 12/26/14 2114     12/26/14 1730  piperacillin-tazobactam (ZOSYN) IVPB 3.375 g     3.375 g 12.5 mL/hr over 240 Minutes Intravenous  Once 12/26/14 1726  12/26/14 1750   12/26/14 1700  piperacillin-tazobactam (ZOSYN) IVPB 3.375 g  Status:  Discontinued     3.375 g 12.5 mL/hr over 240 Minutes Intravenous  Once 12/26/14 1649 12/26/14 1726   12/26/14 1700  vancomycin (VANCOCIN) IVPB 1000 mg/200 mL premix     1,000 mg 200 mL/hr over 60 Minutes Intravenous  Once 12/26/14 1650 12/26/14 1850     Assessment: Empiric abx for diverticulitis.   Plan:  Zosyn 3.375 g iv once then 3.375 g EI q 8 h.   Ulice Dash D 12/26/2014,9:15 PM

## 2014-12-26 NOTE — ED Notes (Signed)
Pt via ems from home with c/o lower abdominal pain x 1 week but worse in last 2 days; pt states no BM x 2 days and that she has had some nausea and 1 episode of vomiting. Pt has LLQ and RLQ tenderness. Pt alert & oriented

## 2014-12-26 NOTE — Progress Notes (Signed)
Dr. Lavetta Nielsen notified patient still in pain after ED dose of 2 mg Morphine IV push. MD to put in orders

## 2014-12-27 LAB — BASIC METABOLIC PANEL
Anion gap: 7 (ref 5–15)
BUN: 13 mg/dL (ref 6–20)
CHLORIDE: 104 mmol/L (ref 101–111)
CO2: 25 mmol/L (ref 22–32)
CREATININE: 0.81 mg/dL (ref 0.44–1.00)
Calcium: 7.7 mg/dL — ABNORMAL LOW (ref 8.9–10.3)
GFR calc non Af Amer: >60 mL/min (ref >60)
GLUCOSE: 102 mg/dL — AB (ref 65–99)
POTASSIUM: 3.1 mmol/L — AB (ref 3.5–5.1)
SODIUM: 136 mmol/L (ref 135–145)

## 2014-12-27 LAB — CBC
HCT: 38.1 % (ref 35.0–47.0)
Hemoglobin: 12.5 g/dL (ref 12.0–16.0)
MCH: 29.6 pg (ref 26.0–34.0)
MCHC: 32.7 g/dL (ref 32.0–36.0)
MCV: 90.7 fL (ref 80.0–100.0)
Platelets: 246 10*3/uL (ref 150–440)
RBC: 4.21 MIL/uL (ref 3.80–5.20)
RDW: 13.2 % (ref 11.5–14.5)
WBC: 9.4 10*3/uL (ref 3.6–11.0)

## 2014-12-27 LAB — MAGNESIUM: Magnesium: 1.8 mg/dL (ref 1.7–2.4)

## 2014-12-27 MED ORDER — POLYETHYLENE GLYCOL 3350 17 G PO PACK
17.0000 g | PACK | Freq: Two times a day (BID) | ORAL | Status: DC
  Administered 2014-12-27 – 2014-12-28 (×4): 17 g via ORAL
  Filled 2014-12-27 (×4): qty 1

## 2014-12-27 MED ORDER — POTASSIUM CHLORIDE CRYS ER 20 MEQ PO TBCR
40.0000 meq | EXTENDED_RELEASE_TABLET | Freq: Once | ORAL | Status: AC
Start: 2014-12-27 — End: 2014-12-27
  Administered 2014-12-27: 16:00:00 40 meq via ORAL
  Filled 2014-12-27: qty 2

## 2014-12-27 MED ORDER — ALUM & MAG HYDROXIDE-SIMETH 200-200-20 MG/5ML PO SUSP
15.0000 mL | ORAL | Status: DC | PRN
  Administered 2014-12-27: 15 mL via ORAL
  Filled 2014-12-27: qty 30

## 2014-12-27 NOTE — Plan of Care (Signed)
Problem: Discharge Progression Outcomes Goal: Discharge plan in place and appropriate Patient is from home. High Fall Risk, fell at home morning of 5/24 because she passed out d/t the pain. Hx of HTN, dyslipidemia, anxiety, GERD, TIA, continue with home medication    Goal: Other Discharge Outcomes/Goals Outcome: Progressing Patient is alert and oriented, c/o abdominal pain. Morphine given with relief. Standby assist to the bathroom. NS at 100 ml/hr. PO intake encouraged. No nausea, vomitting at this time.

## 2014-12-27 NOTE — Progress Notes (Signed)
ANTIBIOTIC CONSULT NOTE - INITIAL 3 Pharmacy Consult for Zosyn Indication: Diverticulitis  Allergies  Allergen Reactions  . Minocin [Minocycline Hcl] Itching and Rash  . Sulfa Antibiotics Itching and Swelling  . Keflex [Cephalexin] Itching    Patient Measurements: Height: 5\' 7"  (170.2 cm) Weight: 189 lb 1.6 oz (85.775 kg) IBW/kg (Calculated) : 61.6   Vital Signs: Temp: 98.6 F (37 C) (05/25 0455) Temp Source: Oral (05/25 0455) BP: 138/54 mmHg (05/25 0455) Pulse Rate: 94 (05/25 0455)  Labs:  Recent Labs  12/26/14 1630  WBC 16.2*  HGB 14.1  PLT 260  CREATININE 0.83   Estimated Creatinine Clearance: 66.9 mL/min (by C-G formula based on Cr of 0.83).  Microbiology: No results found for this or any previous visit (from the past 720 hour(s)).   Medications:  Anti-infectives    Start     Dose/Rate Route Frequency Ordered Stop   12/27/14 0000  piperacillin-tazobactam (ZOSYN) IVPB 3.375 g  Status:  Discontinued     3.375 g 12.5 mL/hr over 240 Minutes Intravenous 4 times per day 12/26/14 2059 12/26/14 2112   12/26/14 2330  piperacillin-tazobactam (ZOSYN) IVPB 3.375 g     3.375 g 12.5 mL/hr over 240 Minutes Intravenous 3 times per day 12/26/14 2114     12/26/14 1730  piperacillin-tazobactam (ZOSYN) IVPB 3.375 g     3.375 g 12.5 mL/hr over 240 Minutes Intravenous  Once 12/26/14 1726 12/26/14 1750   12/26/14 1700  piperacillin-tazobactam (ZOSYN) IVPB 3.375 g  Status:  Discontinued     3.375 g 12.5 mL/hr over 240 Minutes Intravenous  Once 12/26/14 1649 12/26/14 1726   12/26/14 1700  vancomycin (VANCOCIN) IVPB 1000 mg/200 mL premix     1,000 mg 200 mL/hr over 60 Minutes Intravenous  Once 12/26/14 1650 12/26/14 1850     Assessment: Empiric abx for diverticulitis.   Plan:  Continue zosyn 3.375gm IV Q8H EI  Rexene Edison, PharmD Clinical Pharmacist 12/27/2014,10:37 AM

## 2014-12-27 NOTE — Progress Notes (Signed)
ANTIBIOTIC CONSULT NOTE - INITIAL 3 Pharmacy Consult for Zosyn Indication: Diverticulitis  Allergies  Allergen Reactions  . Minocin [Minocycline Hcl] Itching and Rash  . Sulfa Antibiotics Itching and Swelling  . Keflex [Cephalexin] Itching    Patient Measurements: Height: 5\' 7"  (170.2 cm) Weight: 189 lb 1.6 oz (85.775 kg) IBW/kg (Calculated) : 61.6   Vital Signs: Temp: 98.6 F (37 C) (05/25 0455) Temp Source: Oral (05/25 0455) BP: 138/54 mmHg (05/25 0455) Pulse Rate: 94 (05/25 0455)  Labs:  Recent Labs  12/26/14 1630  WBC 16.2*  HGB 14.1  PLT 260  CREATININE 0.83   Estimated Creatinine Clearance: 66.9 mL/min (by C-G formula based on Cr of 0.83).  Microbiology: No results found for this or any previous visit (from the past 720 hour(s)).  Medical History: Past Medical History  Diagnosis Date  . Hypertension   . Pneumonia   . Migraine headache     with neurologic features  . Dyslipidemia   . Anxiety disorder   . GERD (gastroesophageal reflux disease)   . TIA (transient ischemic attack) 12/18/12  . Migraine headache with aura   . Stroke     Left brain, posterior    Medications:  Anti-infectives    Start     Dose/Rate Route Frequency Ordered Stop   12/27/14 0000  piperacillin-tazobactam (ZOSYN) IVPB 3.375 g  Status:  Discontinued     3.375 g 12.5 mL/hr over 240 Minutes Intravenous 4 times per day 12/26/14 2059 12/26/14 2112   12/26/14 2330  piperacillin-tazobactam (ZOSYN) IVPB 3.375 g     3.375 g 12.5 mL/hr over 240 Minutes Intravenous 3 times per day 12/26/14 2114     12/26/14 1730  piperacillin-tazobactam (ZOSYN) IVPB 3.375 g     3.375 g 12.5 mL/hr over 240 Minutes Intravenous  Once 12/26/14 1726 12/26/14 1750   12/26/14 1700  piperacillin-tazobactam (ZOSYN) IVPB 3.375 g  Status:  Discontinued     3.375 g 12.5 mL/hr over 240 Minutes Intravenous  Once 12/26/14 1649 12/26/14 1726   12/26/14 1700  vancomycin (VANCOCIN) IVPB 1000 mg/200 mL premix     1,000 mg 200 mL/hr over 60 Minutes Intravenous  Once 12/26/14 1650 12/26/14 1850     Assessment: Empiric abx for diverticulitis.   Plan:  Continue zosyn 3.375gm IV Q8H EI  Rexene Edison, PharmD Clinical Pharmacist 12/27/2014,9:06 AM

## 2014-12-27 NOTE — Plan of Care (Signed)
Problem: Discharge Progression Outcomes Goal: Other Discharge Outcomes/Goals Outcome: Progressing Pt cont to have  abd pain. Med given for this with relief for a while. asssit to go to br. tol small amt clear liquids

## 2014-12-27 NOTE — Progress Notes (Signed)
[facility] NAME: Jill Parrish    MR#:  [id number]  DATE OF BIRTH:  [birth year]  SUBJECTIVE:   Admitted May 24 with diverticulitis. He continues to have significant abdominal pain. Has not had a bowel movement yet. No nausea or vomiting is taking by mouth. States it has been 2 weeks since her last bowel movement  REVIEW OF SYSTEMS:   Review of Systems  Constitutional: Negative for fever.  Respiratory: Negative for shortness of breath.   Cardiovascular: Negative for chest pain and palpitations.  Gastrointestinal: Positive for abdominal pain and constipation. Negative for nausea, vomiting and diarrhea.  Genitourinary: Negative for dysuria.    DRUG ALLERGIES:   Allergies  Allergen Reactions  . Minocin [Minocycline Hcl] Itching and Rash  . Sulfa Antibiotics Itching and Swelling  . Keflex [Cephalexin] Itching    VITALS:  Blood pressure 130/45, pulse 81, temperature 98.4 F (36.9 C), temperature source Oral, resp. rate 20, height 5\' 7"  (1.702 m), weight 85.775 kg (189 lb 1.6 oz), SpO2 95 %.  PHYSICAL EXAMINATION:  GENERAL:  74 y.o.-year-old patient lying in the bed with some discomfort EYES: Pupils equal, round, reactive to light and accommodation. No scleral icterus. Extraocular muscles intact.  HEENT: Head atraumatic, normocephalic. Oropharynx and nasopharynx clear.  NECK:  Supple, no jugular venous distention. No thyroid enlargement, no tenderness.  LUNGS: Normal breath sounds bilaterally, no wheezing, rales,rhonchi or crepitation. No use of accessory muscles of respiration.  CARDIOVASCULAR: S1, S2 normal. No murmurs, rubs, or gallops.  ABDOMEN: Soft, tender to palpation throughout worse in the lower abdominal quadrants, nondistended. Bowel sounds diminished. No organomegaly or mass. No guarding or rebound EXTREMITIES: No pedal edema, cyanosis, or clubbing.  NEUROLOGIC: Cranial nerves II through XII are intact. Muscle  strength 5/5 in all extremities. Sensation intact. Gait not checked.  PSYCHIATRIC: The patient is alert and oriented x 3.  SKIN: No obvious rash, lesion, or ulcer.    LABORATORY PANEL:   CBC  Recent Labs Lab 12/26/14 1630  WBC 16.2*  HGB 14.1  HCT 41.6  PLT 260   ------------------------------------------------------------------------------------------------------------------  Chemistries   Recent Labs Lab 12/26/14 1630  NA 137  K 3.3*  CL 99*  CO2 28  GLUCOSE 175*  BUN 18  CREATININE 0.83  CALCIUM 8.7*  AST 22  ALT 21  ALKPHOS 83  BILITOT 1.0   ------------------------------------------------------------------------------------------------------------------  Cardiac Enzymes  Recent Labs Lab 12/26/14 1630  TROPONINI <0.03   ------------------------------------------------------------------------------------------------------------------  RADIOLOGY:  Ct Abdomen Pelvis W Contrast  12/26/2014   CLINICAL DATA:  Lower abdominal pain for 1 week, now with bilateral lower quadrant abdominal pain.  EXAM: CT ABDOMEN AND PELVIS WITH CONTRAST  TECHNIQUE: Multidetector CT imaging of the abdomen and pelvis was performed using the standard protocol following bolus administration of intravenous contrast.  CONTRAST:  138mL OMNIPAQUE IOHEXOL 300 MG/ML  SOLN  COMPARISON:  07/22/2013  FINDINGS: Colonic diverticulosis with extensive ill-defined mesenteric stranding about the distal sigmoid colon worrisome for acute diverticulitis. There is a large (at least 2.2 cm) fluid containing diverticulum arising from the anterior cranial aspect of the sigmoid colon (representative images 72 through 75, series 2). There is no definitive definable/drainable fluid collection. A loop of mid small bowel appears to be secondarily involved within the lower pelvis (representative images 69 and 70, series 2). No evidence of enteric obstruction.  The cecum is noted to be ectopically located within the right  mid hemi abdomen. The bowel is  otherwise normal in course and caliber without wall thickening. Normal appearance of the appendix and terminal ileum. No pneumoperitoneum, pneumatosis or portal venous gas.  Normal hepatic contour. Interval increase in size of exophytic nodules arising from the caudal aspect of the right lobe of the liver with dominant nodule now measuring approximately 4.1 x 2.8 cm, previously, 3.0 x 1.8 cm an additional satellite nodule measuring approximately 2.4 cm in diameter, previously 1 cm. Otherwise, normal hepatic contour. No additional hepatic lesions identified. Normal appearance of the gallbladder. No radiopaque gallstones. No intra extrahepatic biliary duct dilatation. No ascites.  There is symmetric enhancement and excretion of the bilateral kidneys. Unchanged approximately 3.6 cm partially exophytic cyst arising from the anterior interpolar aspect of the left kidney with associated mural calcifications (image 36, series 2), grossly unchanged since the 04/2010 examination. No discrete right-sided renal lesions. No renal stones urinary obstruction. No perinephric stranding. Unchanged approximately 2.3 x 1.6 cm adenoma within in the medial limb of the left adrenal gland (image 21, series 2) unchanged since the 04/2010 examination. Normal appearance of the right adrenal gland, pancreas and spleen.  Extensive calcified atherosclerotic plaque within a normal caliber abdominal aorta. The major branch vessels of the abdominal aorta appear widely patent on this non CTA examination. Scattered shotty retroperitoneal lymph nodes individually not enlarged by size criteria. No retroperitoneal, mesenteric, pelvic or inguinal lymphadenopathy.  Post hysterectomy. No discrete adnexal lesion. Normal appearance of the urinary bladder given degree distention.  Limited visualization of lower thorax demonstrates minimal dependent subpleural ground-glass atelectasis, right greater than left. No focal airspace  opacities in.  Normal heart size.  No pericardial effusion.  No acute or aggressive osseous abnormalities. Moderate severe multilevel lumbar spine DDD, worse at L3-L4 and L5-S1 with disc space height loss, endplate irregularity and sclerosis. Mild presumably degenerative rotatory scoliotic curvature of the lumbar spine.  Small mesenteric fat containing periumbilical hernia.  IMPRESSION: 1. Findings compatible with acute severe though uncomplicated diverticulitis involving the sigmoid colon. Several prominent gas-filled diverticuli are noted arising from the sigmoid colon, though there is no definitive definable/drainable fluid collection. A loop of mid small bowel is secondarily involved with the inflammatory process within the midline of the lower pelvis though does not definitely result in enteric obstruction. 2. Interval increase in size of indeterminate exophytic nodules arising from the caudal aspect of the right lobe of the liver with dominant nodule now measuring approximately 4.1 cm, previously poorly visualized though measuring approximately 3 cm in hind site. Further evaluation with nonemergent abdominal MRI is recommended for lesion characterization. 3. Unchanged approximately 3.6 cm left-sided complex renal cyst (Bosniak class 2), unchanged since the 04/2010 examination. 4. Unchanged approximately 2.3 cm adenoma within medial limb of the left adrenal gland, unchanged since the 04/2010 examination.   Electronically Signed   By: Sandi Mariscal M.D.   On: 12/26/2014 20:08    EKG:   Orders placed or performed during the hospital encounter of 12/26/14  . ED EKG  . ED EKG  . EKG 12-Lead  . EKG 12-Lead    ASSESSMENT AND PLAN:   Principal Problem:   Sepsis Active Problems:   Acute diverticulitis  74 year old Caucasian female history of essential hypertension presenting with abdominal pain  1. Sepsis: - Initially meeting septic criteria by heart rate, leukocytosis present on arrival. Source  acute diverticulitis  - Blood cultures negative so far, UA negative, - Lactic acid is 1.4  #2 acute diverticulitis - CT with significant inflammation and inflamed diverticula, no  obstruction, no signs of perforation, continue Zosyn - Continue pain control  3. Essential hypertension: Hold diuretics, continue Norvasc  4. Gastroesophageal reflux disease without esophagitis: PPI therapy  5.Venous thromboembolism prophylactic: Heparin subcutaneous  6 constipation: Likely contributing to her diverticulitis. We'll start MiraLAX. continue IV fluids   All the records are reviewed and case discussed with Care Management/Social Workerr. Management plans discussed with the patient, family and they are in agreement.  CODE STATUS: Full  TOTAL TIME TAKING CARE OF THIS PATIENT: 35 minutes.   POSSIBLE D/C IN 1-2 DAYS, DEPENDING ON CLINICAL CONDITION.   Myrtis Ser M.D on 12/27/2014 at 3:08 PM  Between 7am to 6pm - Pager - [phone]  After 6pm go to [email or web address] - password [email or web address] [facility]  Office  [phone]  CC: Primary care physician; BABAOFF, Caryl Bis, MD

## 2014-12-27 NOTE — Care Management (Signed)
Met with patient who still complains of abdominal pain. She is from home alone; a supportive neighbor was leaving patient's room when this RNCM walked in. Patient lives alone and complains of hip pain after fall. 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 She agrees to HHPT if needed. She has no DME at home. Her PCP is Dr. Loney Hering. RNCM will continue to follow.

## 2014-12-28 LAB — CBC
HCT: 34.6 % — ABNORMAL LOW (ref 35.0–47.0)
Hemoglobin: 11.9 g/dL — ABNORMAL LOW (ref 12.0–16.0)
MCH: 31 pg (ref 26.0–34.0)
MCHC: 34.4 g/dL (ref 32.0–36.0)
MCV: 89.9 fL (ref 80.0–100.0)
Platelets: 235 10*3/uL (ref 150–440)
RBC: 3.85 MIL/uL (ref 3.80–5.20)
RDW: 13.1 % (ref 11.5–14.5)
WBC: 9.9 10*3/uL (ref 3.6–11.0)

## 2014-12-28 LAB — BASIC METABOLIC PANEL
Anion gap: 10 (ref 5–15)
BUN: 10 mg/dL (ref 6–20)
CALCIUM: 7.6 mg/dL — AB (ref 8.9–10.3)
CO2: 22 mmol/L (ref 22–32)
Chloride: 107 mmol/L (ref 101–111)
Creatinine, Ser: 0.66 mg/dL (ref 0.44–1.00)
GFR calc non Af Amer: >60 mL/min (ref >60)
GLUCOSE: 110 mg/dL — AB (ref 65–99)
POTASSIUM: 3.5 mmol/L (ref 3.5–5.1)
Sodium: 139 mmol/L (ref 135–145)

## 2014-12-28 MED ORDER — ASPIRIN EC 81 MG PO TBEC
81.0000 mg | DELAYED_RELEASE_TABLET | Freq: Every day | ORAL | Status: DC
  Administered 2014-12-28 – 2014-12-30 (×3): 81 mg via ORAL
  Filled 2014-12-28 (×2): qty 1

## 2014-12-28 MED ORDER — DIPHENHYDRAMINE HCL 25 MG PO CAPS
25.0000 mg | ORAL_CAPSULE | Freq: Four times a day (QID) | ORAL | Status: DC | PRN
  Administered 2014-12-28 – 2014-12-30 (×2): 25 mg via ORAL
  Filled 2014-12-28 (×2): qty 1

## 2014-12-28 NOTE — Progress Notes (Signed)
Pt c/o itching to upper back, noted with small red rash across shoulders, Dr. Volanda Napoleon notified, Benadryl ordered, will give and continue to assess. Donnita Falls, RN 12/28/2014 9:39 PM

## 2014-12-28 NOTE — Plan of Care (Signed)
Problem: Discharge Progression Outcomes Goal: Other Discharge Outcomes/Goals Plan of Care Progress To Goal: Pt noted with abdominal pain, improved with use of morphine.  Pt given miralax per orders with positive results of BM, oob with 1 assist to the bathroom, tolerating well

## 2014-12-28 NOTE — Plan of Care (Signed)
Problem: Discharge Progression Outcomes Goal: Other Discharge Outcomes/Goals Plan of Care Progress to Goal:  Pt noted with abdominal pain, improved with use of morphine.  Pt given miralax per orders with positive results of BM tonight, oob with 1 assist to the bathroom, tolerating well.

## 2014-12-28 NOTE — Progress Notes (Signed)
[facility] [place] NAME: Jill Parrish    MR#:  [id number]  DATE OF BIRTH:  November 04, 1940  SUBJECTIVE:   Still having some abdominal pain, but no N/V.  Afebrile, tolerating clear liquids well.   REVIEW OF SYSTEMS:   Review of Systems  Constitutional: Negative for fever and chills.  HENT: Negative for congestion and tinnitus.   Eyes: Negative for blurred vision and double vision.  Respiratory: Negative for cough, shortness of breath and wheezing.   Cardiovascular: Negative for chest pain, orthopnea and PND.  Gastrointestinal: Positive for abdominal pain. Negative for nausea, vomiting and diarrhea.  Genitourinary: Negative for dysuria and hematuria.  Neurological: Negative for dizziness, sensory change, focal weakness and headaches.  All other systems reviewed and are negative.   DRUG ALLERGIES:   Allergies  Allergen Reactions  . Minocin [Minocycline Hcl] Itching and Rash  . Sulfa Antibiotics Itching and Swelling  . Keflex [Cephalexin] Itching    VITALS:  Blood pressure 145/55, pulse 91, temperature 99.4 F (37.4 C), temperature source Oral, resp. rate 18, height 5\' 7"  (1.702 m), weight 85.775 kg (189 lb 1.6 oz), SpO2 94 %.  PHYSICAL EXAMINATION:  GENERAL:  74 y.o.-year-old patient lying in the bed in mild abdominal pain.  EYES: Pupils equal, round, reactive to light and accommodation. No scleral icterus. Extraocular muscles intact.  HEENT: Head atraumatic, normocephalic. Oropharynx and nasopharynx clear.  Moist oral mucosa  NECK:  Supple, no jugular venous distention. No thyroid enlargement, no tenderness.  LUNGS: Normal breath sounds bilaterally, no wheezing, rales,rhonchi or crepitation. No use of accessory muscles of respiration.  CARDIOVASCULAR: S1, S2 normal. No murmurs, rubs, or gallops.  ABDOMEN: Soft, tender diffusely.  No rebound, rigidity.  + BS, No organomegaly.  EXTREMITIES: No pedal edema, cyanosis, or clubbing.   NEUROLOGIC: Cranial nerves II through XII are intact. Muscle strength 5/5 in all extremities. Sensation intact. Gait not checked.  PSYCHIATRIC: The patient is alert and oriented x 3.  SKIN: No obvious rash, lesion, or ulcer.    LABORATORY PANEL:   CBC  Recent Labs Lab 12/28/14 0425  WBC 9.9  HGB 11.9*  HCT 34.6*  PLT 235   ------------------------------------------------------------------------------------------------------------------  Chemistries   Recent Labs Lab 12/26/14 1630 12/27/14 1511 12/28/14 0425  NA 137 136 139  K 3.3* 3.1* 3.5  CL 99* 104 107  CO2 28 25 22   GLUCOSE 175* 102* 110*  BUN 18 13 10   CREATININE 0.83 0.81 0.66  CALCIUM 8.7* 7.7* 7.6*  MG  --  1.8  --   AST 22  --   --   ALT 21  --   --   ALKPHOS 83  --   --   BILITOT 1.0  --   --    ------------------------------------------------------------------------------------------------------------------  Cardiac Enzymes  Recent Labs Lab 12/26/14 1630  TROPONINI <0.03   ------------------------------------------------------------------------------------------------------------------  RADIOLOGY:  Ct Abdomen Pelvis W Contrast  12/26/2014   CLINICAL DATA:  Lower abdominal pain for 1 week, now with bilateral lower quadrant abdominal pain.  EXAM: CT ABDOMEN AND PELVIS WITH CONTRAST  TECHNIQUE: Multidetector CT imaging of the abdomen and pelvis was performed using the standard protocol following bolus administration of intravenous contrast.  CONTRAST:  161mL OMNIPAQUE IOHEXOL 300 MG/ML  SOLN  COMPARISON:  07/22/2013  FINDINGS: Colonic diverticulosis with extensive ill-defined mesenteric stranding about the distal sigmoid colon worrisome for acute diverticulitis. There is a large (at least 2.2 cm) fluid containing diverticulum arising from the  anterior cranial aspect of the sigmoid colon (representative images 72 through 75, series 2). There is no definitive definable/drainable fluid collection. A loop of  mid small bowel appears to be secondarily involved within the lower pelvis (representative images 69 and 70, series 2). No evidence of enteric obstruction.  The cecum is noted to be ectopically located within the right mid hemi abdomen. The bowel is otherwise normal in course and caliber without wall thickening. Normal appearance of the appendix and terminal ileum. No pneumoperitoneum, pneumatosis or portal venous gas.  Normal hepatic contour. Interval increase in size of exophytic nodules arising from the caudal aspect of the right lobe of the liver with dominant nodule now measuring approximately 4.1 x 2.8 cm, previously, 3.0 x 1.8 cm an additional satellite nodule measuring approximately 2.4 cm in diameter, previously 1 cm. Otherwise, normal hepatic contour. No additional hepatic lesions identified. Normal appearance of the gallbladder. No radiopaque gallstones. No intra extrahepatic biliary duct dilatation. No ascites.  There is symmetric enhancement and excretion of the bilateral kidneys. Unchanged approximately 3.6 cm partially exophytic cyst arising from the anterior interpolar aspect of the left kidney with associated mural calcifications (image 36, series 2), grossly unchanged since the 04/2010 examination. No discrete right-sided renal lesions. No renal stones urinary obstruction. No perinephric stranding. Unchanged approximately 2.3 x 1.6 cm adenoma within in the medial limb of the left adrenal gland (image 21, series 2) unchanged since the 04/2010 examination. Normal appearance of the right adrenal gland, pancreas and spleen.  Extensive calcified atherosclerotic plaque within a normal caliber abdominal aorta. The major branch vessels of the abdominal aorta appear widely patent on this non CTA examination. Scattered shotty retroperitoneal lymph nodes individually not enlarged by size criteria. No retroperitoneal, mesenteric, pelvic or inguinal lymphadenopathy.  Post hysterectomy. No discrete adnexal  lesion. Normal appearance of the urinary bladder given degree distention.  Limited visualization of lower thorax demonstrates minimal dependent subpleural ground-glass atelectasis, right greater than left. No focal airspace opacities in.  Normal heart size.  No pericardial effusion.  No acute or aggressive osseous abnormalities. Moderate severe multilevel lumbar spine DDD, worse at L3-L4 and L5-S1 with disc space height loss, endplate irregularity and sclerosis. Mild presumably degenerative rotatory scoliotic curvature of the lumbar spine.  Small mesenteric fat containing periumbilical hernia.  IMPRESSION: 1. Findings compatible with acute severe though uncomplicated diverticulitis involving the sigmoid colon. Several prominent gas-filled diverticuli are noted arising from the sigmoid colon, though there is no definitive definable/drainable fluid collection. A loop of mid small bowel is secondarily involved with the inflammatory process within the midline of the lower pelvis though does not definitely result in enteric obstruction. 2. Interval increase in size of indeterminate exophytic nodules arising from the caudal aspect of the right lobe of the liver with dominant nodule now measuring approximately 4.1 cm, previously poorly visualized though measuring approximately 3 cm in hind site. Further evaluation with nonemergent abdominal MRI is recommended for lesion characterization. 3. Unchanged approximately 3.6 cm left-sided complex renal cyst (Bosniak class 2), unchanged since the 04/2010 examination. 4. Unchanged approximately 2.3 cm adenoma within medial limb of the left adrenal gland, unchanged since the 04/2010 examination.   Electronically Signed   By: Sandi Mariscal M.D.   On: 12/26/2014 20:08    EKG:   Orders placed or performed during the hospital encounter of 12/26/14  . ED EKG  . ED EKG  . EKG 12-Lead  . EKG 12-Lead    ASSESSMENT AND PLAN:   Principal Problem:  Sepsis Active Problems:    Acute diverticulitis  74 year old Caucasian female history of essential hypertension presenting with abdominal pain  1. Sepsis: initially meeting criteria but has improved now.  - afebrile, hemodynamically stable.  Likely source is Acute diverticulitis as seen on CT.   2. acute diverticulitis - likely cause of pt's abdominal pain.  - CT with significant inflammation and inflamed diverticula, no obstruction, no signs of perforation or abscess. Slow to improved.  - cont. Supportive care w/ IV fluid, anti-emetics, pain control.  - continue Zosyn - cont. Clear liquid diet for now as still having some abdominal pain.   3. Essential hypertension: Hold diuretics, continue Norvasc  4. Gastroesophageal reflux disease without esophagitis: cont. Protonix.   5. constipation: Likely contributing to her diverticulitis.  - cont. Miralax and monitor.     All the records are reviewed and case discussed with Care Management/Social Workerr. Management plans discussed with the patient, family and they are in agreement.  CODE STATUS: Full  TOTAL TIME TAKING CARE OF THIS PATIENT: 30 minutes.   POSSIBLE D/C IN 1-2 DAYS, DEPENDING ON CLINICAL CONDITION.   Henreitta Leber M.D on 12/28/2014 at 9:28 AM  Between 7am to 6pm - Pager - [phone]  After 6pm go to [email or web address] - password [email or web address] [facility]  Office  [phone]  CC: Primary care physician; BABAOFF, Caryl Bis, MD

## 2014-12-29 NOTE — Progress Notes (Signed)
ANTIBIOTIC CONSULT NOTE  3 Pharmacy Consult for Zosyn Indication: Diverticulitis  Allergies  Allergen Reactions  . Minocin [Minocycline Hcl] Itching and Rash  . Sulfa Antibiotics Itching and Swelling  . Keflex [Cephalexin] Itching    Patient Measurements: Height: 5\' 7"  (170.2 cm) Weight: 189 lb 1.6 oz (85.775 kg) IBW/kg (Calculated) : 61.6   Vital Signs: Temp: 99.6 F (37.6 C) (05/27 0522) Temp Source: Oral (05/27 0522) BP: 134/60 mmHg (05/27 0522) Pulse Rate: 83 (05/27 0522)  Labs:  Recent Labs  12/26/14 1630 12/27/14 1511 12/28/14 0425  WBC 16.2* 9.4 9.9  HGB 14.1 12.5 11.9*  PLT 260 246 235  CREATININE 0.83 0.81 0.66   Estimated Creatinine Clearance: 69.4 mL/min (by C-G formula based on Cr of 0.66).  Microbiology: Recent Results (from the past 720 hour(s))  Blood culture (routine x 2)     Status: None (Preliminary result)   Collection Time: 12/26/14  4:30 PM  Result Value Ref Range Status   Specimen Description BLOOD  Final   Special Requests Normal  Final   Culture NO GROWTH 3 DAYS  Final   Report Status PENDING  Incomplete  Blood culture (routine x 2)     Status: None (Preliminary result)   Collection Time: 12/26/14  4:31 PM  Result Value Ref Range Status   Specimen Description BLOOD  Final   Special Requests Normal  Final   Culture NO GROWTH 3 DAYS  Final   Report Status PENDING  Incomplete    Medical History: Past Medical History  Diagnosis Date  . Hypertension   . Pneumonia   . Migraine headache     with neurologic features  . Dyslipidemia   . Anxiety disorder   . GERD (gastroesophageal reflux disease)   . TIA (transient ischemic attack) 12/18/12  . Migraine headache with aura   . Stroke     Left brain, posterior    Medications:  Anti-infectives    Start     Dose/Rate Route Frequency Ordered Stop   12/27/14 0000  piperacillin-tazobactam (ZOSYN) IVPB 3.375 g  Status:  Discontinued     3.375 g 12.5 mL/hr over 240 Minutes Intravenous 4  times per day 12/26/14 2059 12/26/14 2112   12/26/14 2330  piperacillin-tazobactam (ZOSYN) IVPB 3.375 g     3.375 g 12.5 mL/hr over 240 Minutes Intravenous 3 times per day 12/26/14 2114     12/26/14 1730  piperacillin-tazobactam (ZOSYN) IVPB 3.375 g     3.375 g 12.5 mL/hr over 240 Minutes Intravenous  Once 12/26/14 1726 12/26/14 1750   12/26/14 1700  piperacillin-tazobactam (ZOSYN) IVPB 3.375 g  Status:  Discontinued     3.375 g 12.5 mL/hr over 240 Minutes Intravenous  Once 12/26/14 1649 12/26/14 1726   12/26/14 1700  vancomycin (VANCOCIN) IVPB 1000 mg/200 mL premix     1,000 mg 200 mL/hr over 60 Minutes Intravenous  Once 12/26/14 1650 12/26/14 1850     Assessment: Empiric abx for diverticulitis.   Plan:  Continue zosyn 3.375gm IV Q8H EI  Larene Beach, PharmD Clinical Pharmacist 12/29/2014,10:02 AM

## 2014-12-29 NOTE — Plan of Care (Signed)
Problem: Discharge Progression Outcomes Goal: Other Discharge Outcomes/Goals Plan of Care Progress to Goal:  Pt with pain tonight x 1, morphine given per orders will monitor for relief.  Pt given benadryl x 1 for itching on rash at top of shoulders and back, itch relieved, rash did not spread or worsen, pt confused at intervals tonight not understanding why she is here, but oriented to self, place and time.  Pt on HIGH fall precautions, bed alarm in use for safety, no injury noted.

## 2014-12-29 NOTE — Plan of Care (Signed)
Problem: Discharge Progression Outcomes Goal: Other Discharge Outcomes/Goals Plan of Care Progress To Goal: No c/o pain.  rash at top of shoulders and back,  rash did not spread or worsen,   Pt on HIGH fall precautions, bed alarm in use for safety, no injury noted

## 2014-12-29 NOTE — Progress Notes (Signed)
[facility] [place] NAME: Jill Parrish    MR#:  [id number]  DATE OF BIRTH:  23-May-1941  SUBJECTIVE:   Abdominal pain improved.  No N/V and afebrile and hemodynamically stable.  Daughter at bedside.   REVIEW OF SYSTEMS:   Review of Systems  Constitutional: Negative for fever and chills.  HENT: Negative for congestion and tinnitus.   Eyes: Negative for blurred vision and double vision.  Respiratory: Negative for cough, shortness of breath and wheezing.   Cardiovascular: Negative for chest pain, orthopnea and PND.  Gastrointestinal: Positive for abdominal pain (improved). Negative for nausea, vomiting, diarrhea and melena.  Genitourinary: Negative for dysuria and hematuria.  Neurological: Negative for dizziness, sensory change and focal weakness.  All other systems reviewed and are negative.   DRUG ALLERGIES:   Allergies  Allergen Reactions  . Minocin [Minocycline Hcl] Itching and Rash  . Sulfa Antibiotics Itching and Swelling  . Keflex [Cephalexin] Itching    VITALS:  Blood pressure 134/60, pulse 83, temperature 99.6 F (37.6 C), temperature source Oral, resp. rate 18, height 5\' 7"  (1.702 m), weight 85.775 kg (189 lb 1.6 oz), SpO2 95 %.  PHYSICAL EXAMINATION:  GENERAL:  74 y.o.-year-old patient lying in the bed in NAD.  EYES: Pupils equal, round, reactive to light and accommodation. No scleral icterus. Extraocular muscles intact.  HEENT: Head atraumatic, normocephalic. Oropharynx and nasopharynx clear.  Moist oral mucosa  NECK:  Supple, no jugular venous distention. No thyroid enlargement, no tenderness.  LUNGS: Normal breath sounds bilaterally, no wheezing, rales,rhonchi or crepitation. No use of accessory muscles of respiration.  CARDIOVASCULAR: S1, S2 normal. No murmurs, rubs, or gallops.  ABDOMEN: Soft, LUQ/LLQ tenderness.  No rebound, rigidity.  + BS, No organomegaly.  EXTREMITIES: No pedal edema, cyanosis, or  clubbing.  NEUROLOGIC: Cranial nerves II through XII are intact. No focal motor or sensory defecits b/l.   PSYCHIATRIC: The patient is alert and oriented x 3. Good affect.  SKIN: No obvious rash, lesion, or ulcer.    LABORATORY PANEL:   CBC  Recent Labs Lab 12/28/14 0425  WBC 9.9  HGB 11.9*  HCT 34.6*  PLT 235   ------------------------------------------------------------------------------------------------------------------  Chemistries   Recent Labs Lab 12/26/14 1630 12/27/14 1511 12/28/14 0425  NA 137 136 139  K 3.3* 3.1* 3.5  CL 99* 104 107  CO2 28 25 22   GLUCOSE 175* 102* 110*  BUN 18 13 10   CREATININE 0.83 0.81 0.66  CALCIUM 8.7* 7.7* 7.6*  MG  --  1.8  --   AST 22  --   --   ALT 21  --   --   ALKPHOS 83  --   --   BILITOT 1.0  --   --    ------------------------------------------------------------------------------------------------------------------  Cardiac Enzymes  Recent Labs Lab 12/26/14 1630  TROPONINI <0.03   ------------------------------------------------------------------------------------------------------------------  RADIOLOGY:  No results found.   ASSESSMENT AND PLAN:   Principal Problem:   Sepsis Active Problems:   Acute diverticulitis  74 year old Caucasian female history of essential hypertension presenting with abdominal pain  1. Sepsis: initially meeting criteria but has improved now.  - afebrile, hemodynamically stable.  Likely source is Acute diverticulitis as seen on CT. Clinically doing well.    2. acute diverticulitis - likely cause of pt's abdominal pain.  - CT with significant inflammation and inflamed diverticula, no obstruction, no signs of perforation or abscess.  Feels better today.  - cont. Supportive care w/ IV  fluid, anti-emetics, pain control.  - continue Zosyn - will advanced to full liquid diet today and if doing well tomorrow a.m. Then d/c home on oral abx in a.m. Tomorrow.   3. Essential  hypertension: Hold diuretics, continue Norvasc  4. Gastroesophageal reflux disease without esophagitis: cont. Protonix.   5. constipation: Likely contributing to her diverticulitis.  - improved w/ miralax and now resolved.     All the records are reviewed and case discussed with Care Management/Social Workerr. Management plans discussed with the patient, family and they are in agreement.  CODE STATUS: Full  TOTAL TIME TAKING CARE OF THIS PATIENT: 30 minutes.   POSSIBLE D/C tomorrow a.m. Discussed plan w/ daughter.     Henreitta Leber M.D on 12/29/2014 at 1:14 PM  Between 7am to 6pm - Pager - [phone]  After 6pm go to [email or web address] - password [email or web address] [facility]  Office  [phone]  CC: Primary care physician; BABAOFF, Caryl Bis, MD

## 2014-12-30 MED ORDER — CIPROFLOXACIN HCL 500 MG PO TABS: 500.0000 mg | ORAL_TABLET | Freq: Two times a day (BID) | ORAL | Status: DC

## 2014-12-30 MED ORDER — METRONIDAZOLE 500 MG PO TABS: 500.0000 mg | ORAL_TABLET | Freq: Three times a day (TID) | ORAL | Status: DC

## 2014-12-30 NOTE — Discharge Instructions (Signed)

## 2014-12-30 NOTE — Plan of Care (Signed)
Problem: Discharge Progression Outcomes Goal: Other Discharge Outcomes/Goals Plan of Care Progress to Goal:  Pt without c/o pain tonight, tolerating full liquid diet, benadryl x 1 for c/o itching to back.

## 2014-12-30 NOTE — Discharge Summary (Signed)
[facility] NAME: Jill Parrish    MR#:  [id number]  DATE OF BIRTH:  Jul 04, 1941  DATE OF ADMISSION:  12/26/2014 ADMITTING PHYSICIAN: Lytle Butte, MD  DATE OF DISCHARGE: 12/30/2014 11:46 AM  PRIMARY CARE PHYSICIAN: BABAOFF, MARC E, MD    ADMISSION DIAGNOSIS:  Diverticulitis of large intestine without perforation or abscess without bleeding [K57.32] Sepsis, due to unspecified organism [A41.9]  DISCHARGE DIAGNOSIS:  Principal Problem:   Sepsis Active Problems:   Acute diverticulitis   SECONDARY DIAGNOSIS:   Past Medical History  Diagnosis Date  . Hypertension   . Pneumonia   . Migraine headache     with neurologic features  . Dyslipidemia   . Anxiety disorder   . GERD (gastroesophageal reflux disease)   . TIA (transient ischemic attack) 12/18/12  . Migraine headache with aura   . Stroke     Left brain, posterior    HOSPITAL COURSE:   74 year old female with past medical history HYPERTENSION, migraines, anxiety, GERD, history of previous TIAs, history of previous CVA, who presented to the hospital due to abdominal pain and noted to have CT scan findings suggestive of acute diverticulitis.  #1 sepsis-this was likely secondary to acute diverticulitis. Patient met criteria given her tachycardia and leukocytosis on admission. Patient was started on IV antibiotics with the Zosyn and given IV fluids. -Patient's blood cultures have remained negative and after treatment with IV antibiotics and IV fluids  Sepsis has now resolved  #2 acute diverticulitis-this is likely the cause of patient's abdominal pain. Patient was treated with broad-spectrum IV antibiotics with IV Zosyn. Initially patient was kept nothing by mouth and then her diet was slowly advanced as her pain and clinical symptoms improved. She is currently pain-free and tolerating a regular diet and therefore being discharged on oral ciprofloxacin and Flagyl for  an additional 10 days.  #3 hypertension-patient remained hemodynamically stable - Continue Norvasc, telmisartan/HCTZ  #4 GERD patient was maintained on Protonix but she will resume her omeprazole.  #5 constipation-this was likely secondary to diverticulitis. Patient was given some MiraLAX and this has now resolved    DISCHARGE CONDITIONS:   Stable  CONSULTS OBTAINED:  Treatment Team:  Lytle Butte, MD Aldean Jewett, MD  DRUG ALLERGIES:   Allergies  Allergen Reactions  . Minocin [Minocycline Hcl] Itching and Rash  . Sulfa Antibiotics Itching and Swelling  . Keflex [Cephalexin] Itching    DISCHARGE MEDICATIONS:   Discharge Medication List as of 12/30/2014 10:01 AM    START taking these medications   Details  ciprofloxacin (CIPRO) 500 MG tablet Take 1 tablet (500 mg total) by mouth 2 (two) times daily., Starting 12/30/2014, Until Discontinued, Print    metroNIDAZOLE (FLAGYL) 500 MG tablet Take 1 tablet (500 mg total) by mouth 3 (three) times daily., Starting 12/30/2014, Until Discontinued, Print      CONTINUE these medications which have NOT CHANGED   Details  acetaminophen (TYLENOL) 500 MG tablet Take 500 mg by mouth every 6 (six) hours as needed for mild pain or headache., Until Discontinued, Historical Med    amLODipine (NORVASC) 5 MG tablet Take 5 mg by mouth daily., Until Discontinued, Historical Med    aspirin EC 81 MG tablet Take 81 mg by mouth daily., Until Discontinued, Historical Med    dexlansoprazole (DEXILANT) 60 MG capsule Take 60 mg by mouth daily., Until Discontinued, Historical Med    diclofenac sodium (VOLTAREN) 1 % GEL Apply 2  g topically as needed (for pain)., Until Discontinued, Historical Med    MELATONIN PO Take 1 tablet by mouth at bedtime as needed (for sleep)., Until Discontinued, Historical Med    Nutritional Supplements (JUICE PLUS FIBRE PO) Take 1 capsule by mouth daily. , Until Discontinued, Historical Med    OVER THE COUNTER  MEDICATION Apply 1-2 application topically as needed (for pain). Pt uses a 3% cream that contains diclofenac, baclofen, bupivacaine, gabapentin, ibuprofen, and pentazocine., Until Discontinued, Historical Med    PREVIDENT 5000 SENSITIVE 1.1-5 % PSTE Take 1 application by mouth 2 (two) times daily. , Starting 03/30/2013, Until Discontinued, Historical Med    ranitidine (ZANTAC) 150 MG tablet Take 150 mg by mouth 2 (two) times daily as needed for heartburn., Until Discontinued, Historical Med    sodium chloride (OCEAN) 0.65 % SOLN nasal spray Place 1 spray into the nose daily as needed for congestion., Until Discontinued, Historical Med    telmisartan-hydrochlorothiazide (MICARDIS HCT) 80-12.5 MG per tablet Take 1 tablet by mouth daily., Until Discontinued, Historical Med      STOP taking these medications     rosuvastatin (CRESTOR) 5 MG tablet          DISCHARGE INSTRUCTIONS:   DIET:  Cardiac diet  DISCHARGE CONDITION:  Stable  ACTIVITY:  Activity as tolerated  OXYGEN:  Home Oxygen: No.   Oxygen Delivery: room air  DISCHARGE LOCATION:  home   If you experience worsening of your admission symptoms, develop shortness of breath, life threatening emergency, suicidal or homicidal thoughts you must seek medical attention immediately by calling 911 or calling your MD immediately  if symptoms less severe.  You Must read complete instructions/literature along with all the possible adverse reactions/side effects for all the Medicines you take and that have been prescribed to you. Take any new Medicines after you have completely understood and accpet all the possible adverse reactions/side effects.   Please note  You were cared for by a hospitalist during your hospital stay. If you have any questions about your discharge medications or the care you received while you were in the hospital after you are discharged, you can call the unit and asked to speak with the hospitalist on call if  the hospitalist that took care of you is not available. Once you are discharged, your primary care physician will handle any further medical issues. Please note that NO REFILLS for any discharge medications will be authorized once you are discharged, as it is imperative that you return to your primary care physician (or establish a relationship with a primary care physician if you do not have one) for your aftercare needs so that they can reassess your need for medications and monitor your lab values.     Today    VITAL SIGNS:  Blood pressure 147/63, pulse 76, temperature 98.1 F (36.7 C), temperature source Oral, resp. rate 18, height 5' 7"  (1.702 m), weight 85.775 kg (189 lb 1.6 oz), SpO2 97 %.  I/O:   Intake/Output Summary (Last 24 hours) at 12/30/14 1311 Last data filed at 12/30/14 0800  Gross per 24 hour  Intake    320 ml  Output    700 ml  Net   -380 ml    PHYSICAL EXAMINATION:  GENERAL:  74 y.o.-year-old patient lying in the bed with no acute distress.  EYES: Pupils equal, round, reactive to light and accommodation. No scleral icterus. Extraocular muscles intact.  HEENT: Head atraumatic, normocephalic. Oropharynx and nasopharynx clear.  NECK:  Supple, no jugular venous distention. No thyroid enlargement, no tenderness.  LUNGS: Normal breath sounds bilaterally, no wheezing, rales,rhonchi or crepitation. No use of accessory muscles of respiration.  CARDIOVASCULAR: S1, S2 normal. No murmurs, rubs, or gallops.  ABDOMEN: Soft, non-tender, non-distended. Bowel sounds present. No organomegaly or mass.  EXTREMITIES: No pedal edema, cyanosis, or clubbing.  NEUROLOGIC: Cranial nerves II through XII are intact. No focal motor or sensory defecits b/l.  PSYCHIATRIC: The patient is alert and oriented x 3.  SKIN: No obvious rash, lesion, or ulcer.   DATA REVIEW:   CBC  Recent Labs Lab 12/28/14 0425  WBC 9.9  HGB 11.9*  HCT 34.6*  PLT 235    Chemistries   Recent Labs Lab  12/26/14 1630 12/27/14 1511 12/28/14 0425  NA 137 136 139  K 3.3* 3.1* 3.5  CL 99* 104 107  CO2 28 25 22   GLUCOSE 175* 102* 110*  BUN 18 13 10   CREATININE 0.83 0.81 0.66  CALCIUM 8.7* 7.7* 7.6*  MG  --  1.8  --   AST 22  --   --   ALT 21  --   --   ALKPHOS 83  --   --   BILITOT 1.0  --   --     Cardiac Enzymes  Recent Labs Lab 12/26/14 1630  TROPONINI <0.03    Microbiology Results  Results for orders placed or performed during the hospital encounter of 12/26/14  Blood culture (routine x 2)     Status: None (Preliminary result)   Collection Time: 12/26/14  4:30 PM  Result Value Ref Range Status   Specimen Description BLOOD  Final   Special Requests Normal  Final   Culture NO GROWTH 3 DAYS  Final   Report Status PENDING  Incomplete  Blood culture (routine x 2)     Status: None (Preliminary result)   Collection Time: 12/26/14  4:31 PM  Result Value Ref Range Status   Specimen Description BLOOD  Final   Special Requests Normal  Final   Culture NO GROWTH 3 DAYS  Final   Report Status PENDING  Incomplete    RADIOLOGY:  No results found.    Management plans discussed with the patient, family and they are in agreement.  CODE STATUS:     Code Status Orders        Start     Ordered   12/26/14 2058  Full code   Continuous     12/26/14 2059    Advance Directive Documentation        Most Recent Value   Type of Advance Directive  Healthcare Power of Attorney   Pre-existing out of facility DNR order (yellow form or pink MOST form)     "MOST" Form in Place?        TOTAL TIME TAKING CARE OF THIS PATIENT: 35 minutes.    Henreitta Leber M.D on 12/30/2014 at 1:11 PM  Between 7am to 6pm - Pager - [phone]  After 6pm go to [email or web address] - password [email or web address] [facility]  Office  [phone]  CC: Primary care physician; BABAOFF, Caryl Bis, MD

## 2014-12-31 LAB — CULTURE, BLOOD (ROUTINE X 2)
CULTURE: NO GROWTH
Culture: NO GROWTH
SPECIAL REQUESTS: NORMAL
Special Requests: NORMAL

## 2015-01-18 ENCOUNTER — Other Ambulatory Visit: Payer: Self-pay | Admitting: Family Medicine

## 2015-01-18 DIAGNOSIS — K7689 Other specified diseases of liver: Secondary | ICD-10-CM

## 2015-01-25 ENCOUNTER — Ambulatory Visit
Admission: RE | Admit: 2015-01-25 | Discharge: 2015-01-25 | Disposition: A | Discharge status: Home / Self Care | Payer: Medicare Other | Source: Ambulatory Visit | Attending: Family Medicine | Admitting: Family Medicine

## 2015-01-25 DIAGNOSIS — Z09 Encounter for follow-up examination after completed treatment for conditions other than malignant neoplasm: Secondary | ICD-10-CM | POA: Diagnosis not present

## 2015-01-25 DIAGNOSIS — K7689 Other specified diseases of liver: Secondary | ICD-10-CM | POA: Diagnosis present

## 2015-01-25 DIAGNOSIS — R160 Hepatomegaly, not elsewhere classified: Secondary | ICD-10-CM | POA: Insufficient documentation

## 2015-01-25 MED ORDER — GADOBENATE DIMEGLUMINE 529 MG/ML IV SOLN
15.0000 mL | Freq: Once | INTRAVENOUS | Status: AC | PRN
  Administered 2015-01-25: 15 mL via INTRAVENOUS

## 2015-09-25 ENCOUNTER — Other Ambulatory Visit: Payer: Self-pay | Admitting: Family Medicine

## 2015-09-25 DIAGNOSIS — Z1231 Encounter for screening mammogram for malignant neoplasm of breast: Secondary | ICD-10-CM

## 2015-10-02 ENCOUNTER — Other Ambulatory Visit: Payer: Self-pay | Admitting: Family Medicine

## 2015-10-02 ENCOUNTER — Ambulatory Visit
Admission: RE | Admit: 2015-10-02 | Discharge: 2015-10-02 | Disposition: A | Discharge status: Home / Self Care | Payer: Medicare Other | Source: Ambulatory Visit | Attending: Family Medicine | Admitting: Family Medicine

## 2015-10-02 DIAGNOSIS — Z1231 Encounter for screening mammogram for malignant neoplasm of breast: Secondary | ICD-10-CM | POA: Diagnosis not present

## 2016-04-16 IMAGING — CT CT ABD-PELV W/ CM
2 of 5 series · 13 of 46 positions shown, 15 images · IV contrast (omnipaque)
Comparison: 07/22/2013

CLINICAL DATA: Lower abdominal pain for 1 week, now with bilateral
lower quadrant abdominal pain.

EXAM:
CT ABDOMEN AND PELVIS WITH CONTRAST
TECHNIQUE: Multidetector CT imaging of the abdomen and pelvis was performed
using the standard protocol following bolus administration of
intravenous contrast.
CONTRAST:  100mL OMNIPAQUE IOHEXOL 300 MG/ML  SOLN

[Series 2: routine abd pel with · axial · 0.72mm/px · z∈[-370,+50]mm · 10 of 94 slices shown, 12 images]
[im 5/94  soft-tissue]
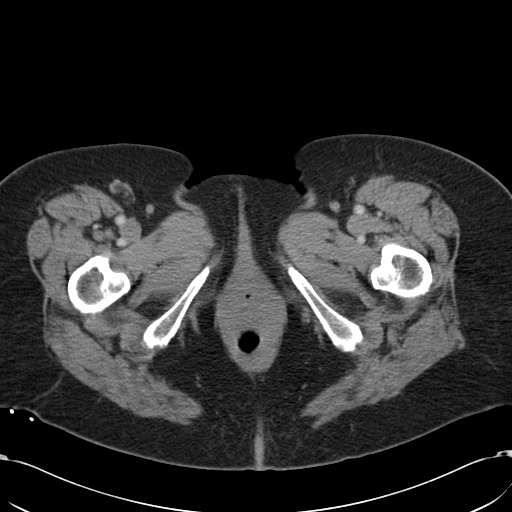
[im 5/94  bone]
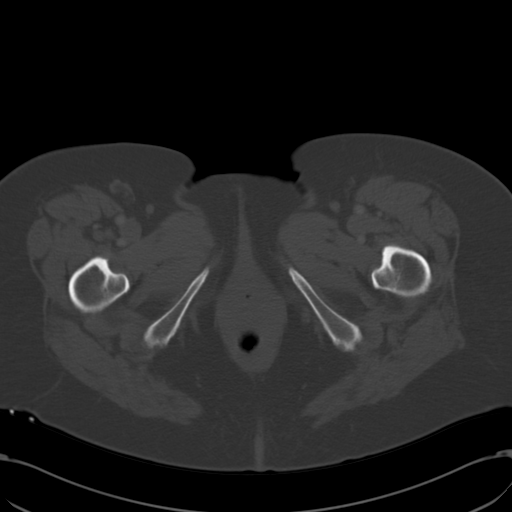
[im 15/94  soft-tissue]
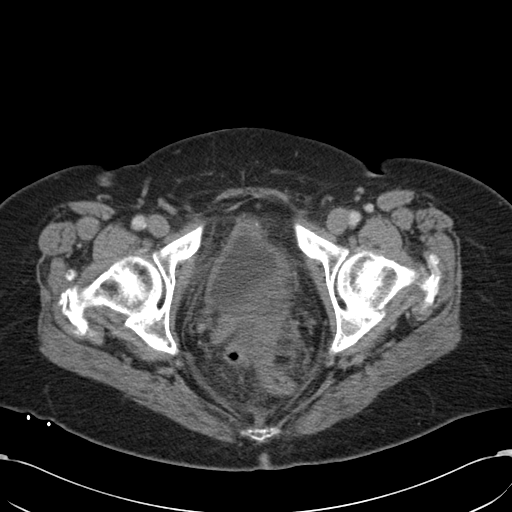
[im 25/94  soft-tissue]
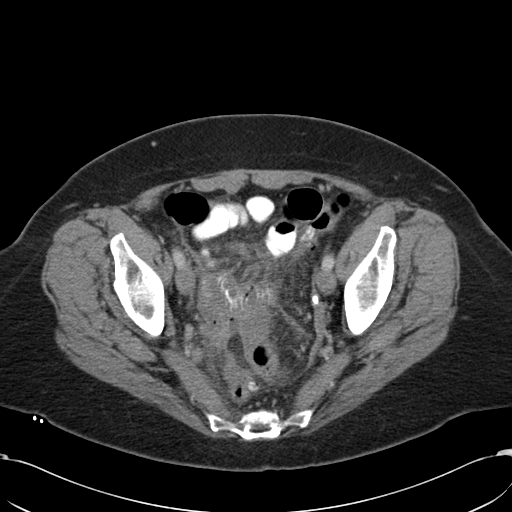
[im 35/94  soft-tissue]
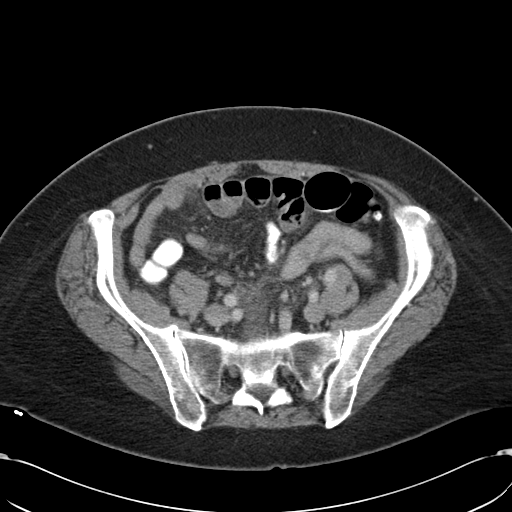
[im 45/94  soft-tissue]
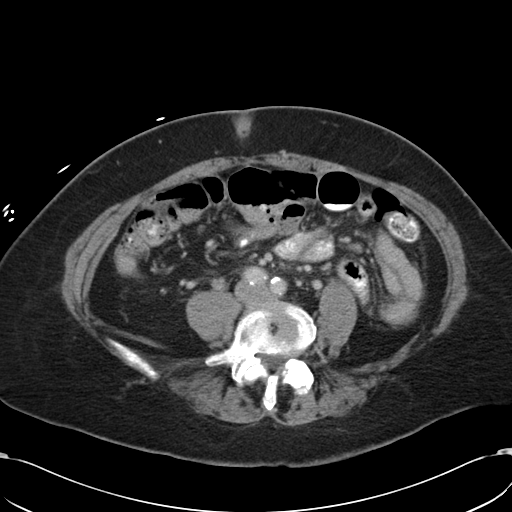
[im 49/94  soft-tissue]
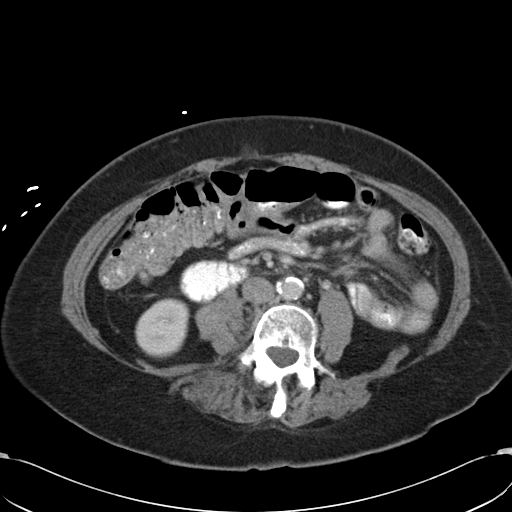
[im 59/94  soft-tissue]
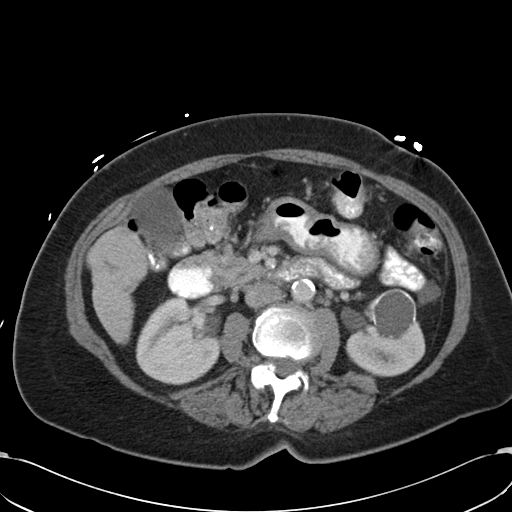
[im 69/94  soft-tissue]
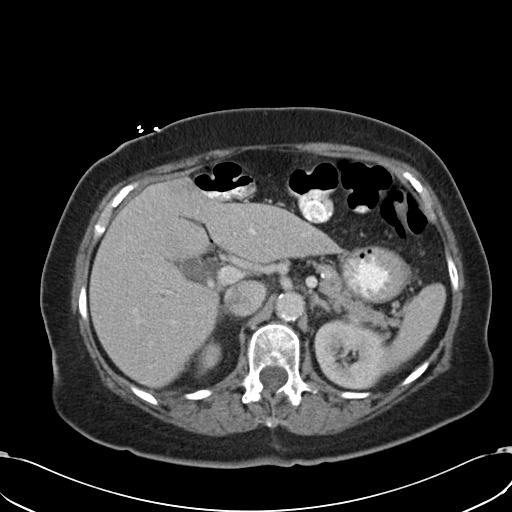
[im 79/94  soft-tissue]
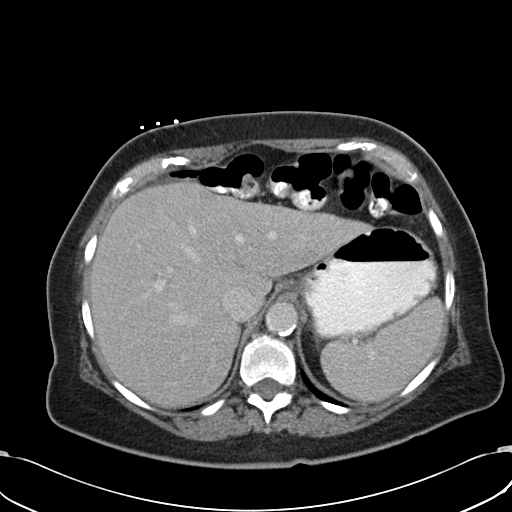
[im 79/94  bone]
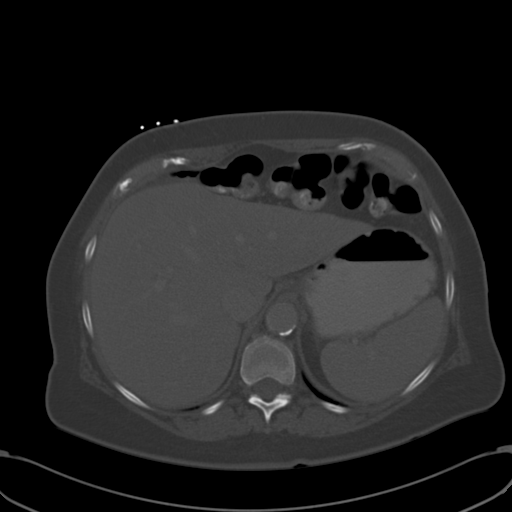
[im 89/94  soft-tissue]
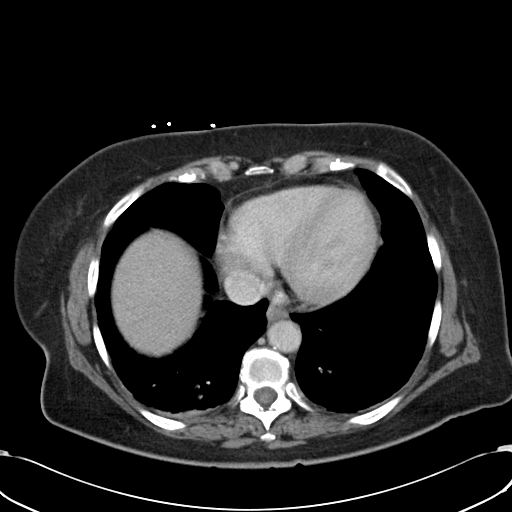

[Series 5: cor routine abd pel with · coronal · 0.67mm/px · 3 of 125 slices shown]
[im 42/125  soft-tissue]
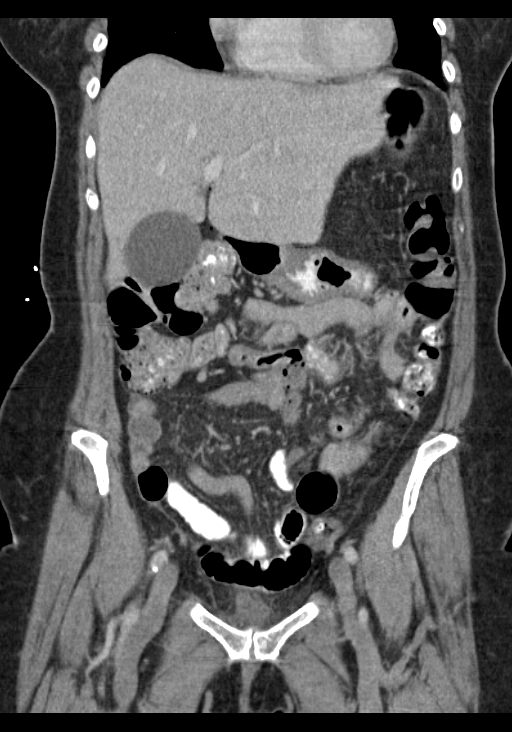
[im 56/125  soft-tissue]
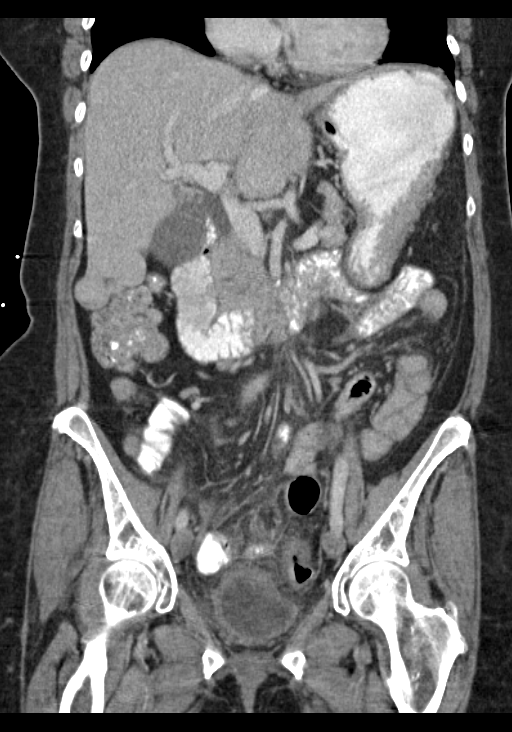
[im 69/125  soft-tissue]
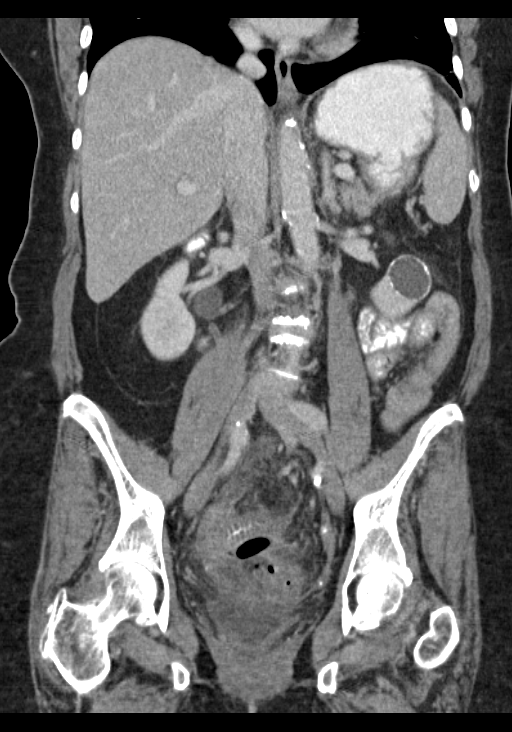

[13 of 46 positions shown; findings below may reference images not displayed]

FINDINGS: Colonic diverticulosis with extensive ill-defined mesenteric
stranding about the distal sigmoid colon worrisome for acute
diverticulitis. There is a large (at least 2.2 cm) fluid containing
diverticulum arising from the anterior cranial aspect of the sigmoid
colon (representative images 72 through 75, series 2). There is no
definitive definable/drainable fluid collection. A loop of mid small
bowel appears to be secondarily involved within the lower pelvis
(representative images 69 and 70, series 2). No evidence of enteric
obstruction.

The cecum is noted to be ectopically located within the right mid
hemi abdomen. The bowel is otherwise normal in course and caliber
without wall thickening. Normal appearance of the appendix and
terminal ileum. No pneumoperitoneum, pneumatosis or portal venous
gas.

Normal hepatic contour. Interval increase in size of exophytic
nodules arising from the caudal aspect of the right lobe of the
liver with dominant nodule now measuring approximately 4.1 x 2.8 cm,
previously, 3.0 x 1.8 cm an additional satellite nodule measuring
approximately 2.4 cm in diameter, previously 1 cm. Otherwise, normal
hepatic contour. No additional hepatic lesions identified. Normal
appearance of the gallbladder. No radiopaque gallstones. No intra
extrahepatic biliary duct dilatation. No ascites.

There is symmetric enhancement and excretion of the bilateral
kidneys. Unchanged approximately 3.6 cm partially exophytic cyst
arising from the anterior interpolar aspect of the left kidney with
associated mural calcifications (image 36, series 2), grossly
unchanged since the [DATE] examination. No discrete right-sided
renal lesions. No renal stones urinary obstruction. No perinephric
stranding. Unchanged approximately 2.3 x 1.6 cm adenoma within in
the medial limb of the left adrenal gland (image 21, series 2)
unchanged since the [DATE] examination. Normal appearance of the
right adrenal gland, pancreas and spleen.

Extensive calcified atherosclerotic plaque within a normal caliber
abdominal aorta. The major branch vessels of the abdominal aorta
appear widely patent on this non CTA examination. Scattered shotty
retroperitoneal lymph nodes individually not enlarged by size
criteria. No retroperitoneal, mesenteric, pelvic or inguinal
lymphadenopathy.

Post hysterectomy. No discrete adnexal lesion. Normal appearance of
the urinary bladder given degree distention.

Limited visualization of lower thorax demonstrates minimal dependent
subpleural ground-glass atelectasis, right greater than left. No
focal airspace opacities in.

Normal heart size.  No pericardial effusion.

No acute or aggressive osseous abnormalities. Moderate severe
multilevel lumbar spine DDD, worse at L3-L4 and L5-S1 with disc
space height loss, endplate irregularity and sclerosis. Mild
presumably degenerative rotatory scoliotic curvature of the lumbar
spine.

Small mesenteric fat containing periumbilical hernia.
IMPRESSION: 1. Findings compatible with acute severe though uncomplicated
diverticulitis involving the sigmoid colon. Several prominent
gas-filled diverticuli are noted arising from the sigmoid colon,
though there is no definitive definable/drainable fluid collection.
A loop of mid small bowel is secondarily involved with the
inflammatory process within the midline of the lower pelvis though
does not definitely result in enteric obstruction.
2. Interval increase in size of indeterminate exophytic nodules
arising from the caudal aspect of the right lobe of the liver with
dominant nodule now measuring approximately 4.1 cm, previously
poorly visualized though measuring approximately 3 cm in hind site.
Further evaluation with nonemergent abdominal MRI is recommended for
lesion characterization.
3. Unchanged approximately 3.6 cm left-sided complex renal cyst
(Bosniak class 2), unchanged since the [DATE] examination.
4. Unchanged approximately 2.3 cm adenoma within medial limb of the
left adrenal gland, unchanged since the [DATE] examination.

## 2016-09-04 ENCOUNTER — Ambulatory Visit (INDEPENDENT_AMBULATORY_CARE_PROVIDER_SITE_OTHER): Payer: Self-pay | Admitting: Vascular Surgery

## 2016-09-26 ENCOUNTER — Other Ambulatory Visit: Payer: Self-pay | Admitting: Family Medicine

## 2016-09-26 DIAGNOSIS — Z1231 Encounter for screening mammogram for malignant neoplasm of breast: Secondary | ICD-10-CM

## 2016-10-21 ENCOUNTER — Ambulatory Visit
Admission: RE | Admit: 2016-10-21 | Discharge: 2016-10-21 | Disposition: A | Discharge status: Home / Self Care | Payer: Medicare Other | Source: Ambulatory Visit | Attending: Family Medicine | Admitting: Family Medicine

## 2016-10-21 DIAGNOSIS — Z1231 Encounter for screening mammogram for malignant neoplasm of breast: Secondary | ICD-10-CM

## 2016-11-13 ENCOUNTER — Other Ambulatory Visit: Payer: Self-pay | Admitting: Nurse Practitioner

## 2016-11-13 DIAGNOSIS — Z86010 Personal history of colonic polyps: Secondary | ICD-10-CM | POA: Insufficient documentation

## 2016-11-13 DIAGNOSIS — Z860101 Personal history of adenomatous and serrated colon polyps: Secondary | ICD-10-CM | POA: Insufficient documentation

## 2016-11-13 DIAGNOSIS — R1084 Generalized abdominal pain: Secondary | ICD-10-CM | POA: Insufficient documentation

## 2016-11-13 DIAGNOSIS — R55 Syncope and collapse: Secondary | ICD-10-CM

## 2016-11-14 ENCOUNTER — Ambulatory Visit
Admission: RE | Admit: 2016-11-14 | Discharge: 2016-11-14 | Disposition: A | Discharge status: Home / Self Care | Payer: Medicare Other | Source: Ambulatory Visit | Attending: Nurse Practitioner | Admitting: Nurse Practitioner

## 2016-11-14 ENCOUNTER — Encounter: Payer: Self-pay | Admitting: Radiology

## 2016-11-14 DIAGNOSIS — R55 Syncope and collapse: Secondary | ICD-10-CM

## 2016-11-14 DIAGNOSIS — R160 Hepatomegaly, not elsewhere classified: Secondary | ICD-10-CM | POA: Diagnosis not present

## 2016-11-14 DIAGNOSIS — N281 Cyst of kidney, acquired: Secondary | ICD-10-CM | POA: Diagnosis not present

## 2016-11-14 DIAGNOSIS — D3502 Benign neoplasm of left adrenal gland: Secondary | ICD-10-CM | POA: Insufficient documentation

## 2016-11-14 DIAGNOSIS — R1084 Generalized abdominal pain: Secondary | ICD-10-CM

## 2016-11-14 MED ORDER — IOPAMIDOL (ISOVUE-300) INJECTION 61%
100.0000 mL | Freq: Once | INTRAVENOUS | Status: AC | PRN
  Administered 2016-11-14: 100 mL via INTRAVENOUS

## 2016-11-23 NOTE — Progress Notes (Signed)
Cardiology Office Note  Date:  11/24/2016   ID:  Jill Parrish, DOB 22-Feb-1941, MRN [id number]  PCP:  Marcello Fennel, MD   Chief Complaint  Patient presents with  . OTHER    Flutter c/o chest discomfort feels like acid reflux and sob. Meds reviewed verbally with pt.    HPI:  60 six-year-old woman with history of  Chest pain syndrome, unspecified March 2004  Felt secondary to GERD, with normal Myoview treadmill stress test and echocardiogram.  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 Jacqlyn Larsen.  Stroke (CMS-HCC) May 2014  Right TIA May 2014 manifested by left-sided paresthesias.  MRI brain and ultrasound carotids, echocardiogram   who presents by referral from Dr. Otto Herb for chest pain symptoms  Previous records reviewed in detail with her Normal echo 2014 Carotid: There is 40-59% ICA stenosis. On the left  She reports having mediastinal chest pain sometimes with exertion sometimes at rest Pain will radiate up into her neck with exertion Sometimes associated with shortness of breath, has to sit to recover Symptoms have been going on for several years Some escalation over the years   Lives in house with 3 floors, She has noticed symptoms come on with climbing stairs  When she walks long distances, she has to stop, catch her breath  Recently Out of dexilant, on H2 blocker Has not noticed much of a difference  Nonsmoker  EKG personally reviewed by myself on todays visit Shows normal sinus rhythm with rate 60 bpm no significant ST or T-wave changes   PMH:   has a past medical history of Anxiety disorder; Dyslipidemia; GERD (gastroesophageal reflux disease); Hyperlipidemia; Hypertension; Migraine headache; Migraine headache with aura; Pneumonia; Stroke [facility]); and TIA  (transient ischemic attack) (12/18/12).  PSH:    Past Surgical History:  Procedure Laterality Date  . ABDOMINAL HYSTERECTOMY    . CATARACT EXTRACTION Bilateral   . KNEE SURGERY Right 2005  . NASAL SINUS SURGERY    . OVARIAN CYST REMOVAL    . PARTIAL HYSTERECTOMY  1985  . ROTATOR CUFF REPAIR Right 2007   arthroscopy  . TONSILLECTOMY  1947    Current Outpatient Prescriptions  Medication Sig Dispense Refill  . acetaminophen (TYLENOL) 500 MG tablet Take 500 mg by mouth every 6 (six) hours as needed for mild pain or headache.    Marland Kitchen amLODipine (NORVASC) 5 MG tablet Take 5 mg by mouth daily.    Marland Kitchen aspirin EC 81 MG tablet Take 81 mg by mouth daily.    Marland Kitchen dexlansoprazole (DEXILANT) 60 MG capsule Take 60 mg by mouth daily.    . diclofenac sodium (VOLTAREN) 1 % GEL Apply 2 g topically as needed (for pain).    . MELATONIN PO Take 1 tablet by mouth at bedtime as needed (for sleep).    . Nutritional Supplements (JUICE PLUS FIBRE PO) Take 1 capsule by mouth daily.     Marland Kitchen OVER THE COUNTER MEDICATION Apply 1-2 application topically as needed (for pain). Pt uses a 3% cream that contains diclofenac, baclofen, bupivacaine, gabapentin, ibuprofen, and pentazocine.    Marland Kitchen PREVIDENT 5000 SENSITIVE 1.1-5 % PSTE Take 1 application by mouth 2 (two) times daily.     . ranitidine (ZANTAC) 150 MG tablet Take 150 mg by mouth 2 (two) times daily as needed for heartburn.    Marland Kitchen  sodium chloride (OCEAN) 0.65 % SOLN nasal spray Place 1 spray into the nose daily as needed for congestion.    Marland Kitchen telmisartan-hydrochlorothiazide (MICARDIS HCT) 80-12.5 MG per tablet Take 1 tablet by mouth daily.    . rosuvastatin (CRESTOR) 5 MG tablet Take 1 tablet (5 mg total) by mouth daily. 90 tablet 3   No current facility-administered medications for this visit.      Allergies:   Erythromycin base; Minocin [minocycline hcl]; Aspirin; Atorvastatin; Cefprozil; Fexofenadine; Loratadine; Sulfa antibiotics; Ciprofloxacin; and Keflex [cephalexin]    Social History:  The patient  reports that she has never smoked. She has never used smokeless tobacco. She reports that she drinks alcohol. She reports that she does not use drugs.   Family History:   family history includes Aneurysm in her sister; Breast cancer (age of onset: 51) in her sister; Breast cancer (age of onset: 4) in her maternal aunt and paternal grandmother; Heart attack in her father; Heart disease in her paternal grandfather and paternal grandmother; Stroke in her maternal aunt, maternal grandfather, and mother.    Review of Systems: Review of Systems  Constitutional: Negative.   Respiratory: Positive for shortness of breath.   Cardiovascular: Positive for chest pain.  Gastrointestinal: Negative.   Musculoskeletal: Negative.   Neurological: Negative.   Psychiatric/Behavioral: Negative.   All other systems reviewed and are negative.    PHYSICAL EXAM: VS:  BP 126/68 (BP Location: Right Arm, Patient Position: Sitting, Cuff Size: Normal)   Pulse 60   Ht 5' 7.5" (1.715 m)   Wt 184 lb 4 oz (83.6 kg)   BMI 28.43 kg/m  , BMI Body mass index is 28.43 kg/m. GEN: Well nourished, well developed, in no acute distress  HEENT: normal  Neck: no JVD, carotid bruits, or masses Cardiac: RRR; no murmurs, rubs, or gallops,no edema  Respiratory:  clear to auscultation bilaterally, normal work of breathing GI: soft, nontender, nondistended, + BS MS: no deformity or atrophy  Skin: warm and dry, no rash Neuro:  Strength and sensation are intact Psych: euthymic mood, full affect    Recent Labs: No results found for requested labs within last 8760 hours.    Lipid Panel Lab Results  Component Value Date   CHOL 240 (H) 12/18/2012   HDL 58 12/18/2012   LDLCALC 137 (H) 12/18/2012   TRIG 224 (H) 12/18/2012      Wt Readings from Last 3 Encounters:  11/24/16 184 lb 4 oz (83.6 kg)  12/26/14 189 lb 1.6 oz (85.8 kg)  04/06/13 196 lb (88.9 kg)       ASSESSMENT AND  PLAN:  Chest pain, unspecified type - Plan: EKG 12-Lead, NM Myocar Multi W/Spect W/Wall Motion / EF Etiology unclear though given underlying peripheral arterial disease/carotid stenosis, unable to exclude underlying coronary disease, and symptoms of angina. Stress test has been ordered, she will try treadmill Myoview. If unable to ambulate will change to pharmacologic Myoview  Essential hypertension - Plan: EKG 12-Lead, NM Myocar Multi W/Spect W/Wall Motion / EF Blood pressure is well controlled on today's visit. No changes made to the medications.  Cerebrovascular accident (CVA), unspecified mechanism ([place]) Recommended she start aspirin 81 mg daily Stressed importance of aggressive cholesterol management given her moderate carotid disease on the left  Stenosis of left carotid artery - Plan: EKG 12-Lead, NM Myocar Multi W/Spect W/Wall Motion / EF Previous carotid reviewed showing 40-59% disease on the left Last study was done in 2014 In follow-up, will recommend repeat study  Mixed hyperlipidemia Suggested she stay on her Zetia We will add Crestor 5 mg every other day with slow titration upwards as tolerated  Shortness of breath Etiology unclear, stress test ordered as above If no ischemia, would recommend regular exercise program  Disposition:   F/U  6 months   Total encounter time more than 45 minutes  Greater than 50% was spent in counseling and coordination of care with the patient    Orders Placed This Encounter  Procedures  . NM Myocar Multi W/Spect W/Wall Motion / EF  . EKG 12-Lead     Signed, Esmond Plants, M.D., Ph.D. 11/24/2016  [place], [employer]

## 2016-11-24 ENCOUNTER — Ambulatory Visit (INDEPENDENT_AMBULATORY_CARE_PROVIDER_SITE_OTHER): Payer: Medicare Other | Admitting: Cardiovascular Disease

## 2016-11-24 ENCOUNTER — Encounter: Payer: Self-pay | Admitting: Cardiovascular Disease

## 2016-11-24 VITALS — BP 126/68 | HR 60 | Ht 67.5 in | Wt 184.2 lb

## 2016-11-24 DIAGNOSIS — E782 Mixed hyperlipidemia: Secondary | ICD-10-CM

## 2016-11-24 DIAGNOSIS — I6522 Occlusion and stenosis of left carotid artery: Secondary | ICD-10-CM | POA: Insufficient documentation

## 2016-11-24 DIAGNOSIS — R0602 Shortness of breath: Secondary | ICD-10-CM

## 2016-11-24 DIAGNOSIS — I10 Essential (primary) hypertension: Secondary | ICD-10-CM | POA: Diagnosis not present

## 2016-11-24 DIAGNOSIS — R079 Chest pain, unspecified: Secondary | ICD-10-CM | POA: Diagnosis not present

## 2016-11-24 DIAGNOSIS — I639 Cerebral infarction, unspecified: Secondary | ICD-10-CM | POA: Diagnosis not present

## 2016-11-24 MED ORDER — ROSUVASTATIN CALCIUM 5 MG PO TABS: 5.0000 mg | ORAL_TABLET | Freq: Every day | ORAL | 3 refills | Status: DC

## 2016-11-24 NOTE — Patient Instructions (Addendum)
Medication Instructions:   Please start crestor 5 mg every other day for 2 months Then if no symptoms go to daily  Labwork:  No new labs needed  Testing/Procedures:  We will order a stress myoview (treadmill) for chest pain/angina [facility] MYOVIEW  Your caregiver has ordered a Stress Test with nuclear imaging. The purpose of this test is to evaluate the blood supply to your heart muscle. This procedure is referred to as a "Non-Invasive Stress Test." This is because other than having an IV started in your vein, nothing is inserted or "invades" your body. Cardiac stress tests are done to find areas of poor blood flow to the heart by determining the extent of coronary artery disease (CAD). Some patients exercise on a treadmill, which naturally increases the blood flow to your heart, while others who are  unable to walk on a treadmill due to physical limitations have a pharmacologic/chemical stress agent called Lexiscan . This medicine will mimic walking on a treadmill by temporarily increasing your coronary blood flow.   Please note: these test may take anywhere between 2-4 hours to complete  PLEASE REPORT TO [place] AT THE FIRST DESK WILL DIRECT YOU WHERE TO GO  Date of Procedure:______Wednesday, May 2______  Arrival Time for Procedure:_____7:45 am_______  How to prepare for your Myoview test:  1. Do not eat or drink after midnight 2. No caffeine for 24 hours prior to test 3. No smoking 24 hours prior to test. 4. Your medication may be taken with water.  If your doctor stopped a medication because of this test, do not take that medication. 5. Ladies, please do not wear dresses.  Skirts or pants are appropriate. Please wear a short sleeve shirt. 6. No perfume, cologne or lotion. 7. Wear comfortable walking shoes. No heels!  Follow-Up: It was a pleasure seeing you in the office today. Please call us if you have new issues that need to be addressed  before your next appt.  [phone]  Your physician wants you to follow-up in: 6 months.  You will receive a reminder letter in the mail two months in advance. If you don't receive a letter, please call our office to schedule the follow-up appointment.  If you need a refill on your cardiac medications before your next appointment, please call your pharmacy.    Pharmacologic Stress Electrocardiogram Introduction A pharmacologic stress electrocardiogram is a heart (cardiac) test that uses nuclear imaging to evaluate the blood supply to your heart. This test may also be called a pharmacologic stress electrocardiography. Pharmacologic means that a medicine is used to increase your heart rate and blood pressure. This stress test is done to find areas of poor blood flow to the heart by determining the extent of coronary artery disease (CAD). Some people exercise on a treadmill, which naturally increases the blood flow to the heart. For those people unable to exercise on a treadmill, a medicine is used. This medicine stimulates your heart and will cause your heart to beat harder and more quickly, as if you were exercising. Pharmacologic stress tests can help determine:  The adequacy of blood flow to your heart during increased levels of activity in order to clear you for discharge home.  The extent of coronary artery blockage caused by CAD.  Your prognosis if you have suffered a heart attack.  The effectiveness of cardiac procedures done, such as an angioplasty, which can increase the circulation in your coronary arteries.  Causes of chest  pain or pressure. LET [facility] CARE PROVIDER KNOW ABOUT:  Any allergies you have.  All medicines you are taking, including vitamins, herbs, eye drops, creams, and over-the-counter medicines.  Previous problems you or members of your family have had with the use of anesthetics.  Any blood disorders you have.  Previous surgeries you have  had.  Medical conditions you have.  Possibility of pregnancy, if this applies.  If you are currently breastfeeding. RISKS AND COMPLICATIONS Generally, this is a safe procedure. However, as with any procedure, complications can occur. Possible complications include:  You develop pain or pressure in the following areas:  Chest.  Jaw or neck.  Between your shoulder blades.  Radiating down your left arm.  Headache.  Dizziness or light-headedness.  Shortness of breath.  Increased or irregular heartbeat.  Low blood pressure.  Nausea or vomiting.  Flushing.  Redness going up the arm and slight pain during injection of medicine.  Heart attack (rare). BEFORE THE PROCEDURE  Avoid all forms of caffeine for 24 hours before your test or as directed by your health care provider. This includes coffee, tea (even decaffeinated tea), caffeinated sodas, chocolate, cocoa, and certain pain medicines.  Follow your health care provider's instructions regarding eating and drinking before the test.  Take your medicines as directed at regular times with water unless instructed otherwise. Exceptions may include:  If you have diabetes, ask how you are to take your insulin or pills. It is common to adjust insulin dosing the morning of the test.  If you are taking beta-blocker medicines, it is important to talk to your health care provider about these medicines well before the date of your test. Taking beta-blocker medicines may interfere with the test. In some cases, these medicines need to be changed or stopped 24 hours or more before the test.  If you wear a nitroglycerin patch, it may need to be removed prior to the test. Ask your health care provider if the patch should be removed before the test.  If you use an inhaler for any breathing condition, bring it with you to the test.  If you are an outpatient, bring a snack so you can eat right after the stress phase of the test.  Do not  smoke for 4 hours prior to the test or as directed by your health care provider.  Do not apply lotions, powders, creams, or oils on your chest prior to the test.  Wear comfortable shoes and clothing. Let your health care provider know if you were unable to complete or follow the preparations for your test. PROCEDURE  Multiple patches (electrodes) will be put on your chest. If needed, small areas of your chest may be shaved to get better contact with the electrodes. Once the electrodes are attached to your body, multiple wires will be attached to the electrodes, and your heart rate will be monitored.  An IV access will be started. A nuclear trace (isotope) is given. The isotope may be given intravenously, or it may be swallowed. Nuclear refers to several types of radioactive isotopes, and the nuclear isotope lights up the arteries so that the nuclear images are clear. The isotope is absorbed by your body. This results in low radiation exposure.  A resting nuclear image is taken to show how your heart functions at rest.  A medicine is given through the IV access.  A second scan is done about 1 hour after the medicine injection and determines how your heart functions under  stress.  During this stress phase, you will be connected to an electrocardiogram machine. Your blood pressure and oxygen levels will be monitored. What to expect after the procedure  Your heart rate and blood pressure will be monitored after the test.  You may return to your normal schedule, including diet,activities, and medicines, unless your health care provider tells you otherwise. This information is not intended to replace advice given to you by your health care provider. Make sure you discuss any questions you have with your health care provider. Document Released: 12/07/2008 Document Revised: 12/27/2015 Document Reviewed: 01/28/2016 Elsevier Interactive Patient Education  2017 [employer].

## 2016-12-03 ENCOUNTER — Encounter
Admission: RE | Admit: 2016-12-03 | Discharge: 2016-12-03 | Disposition: A | Discharge status: Home / Self Care | Payer: Medicare Other | Source: Ambulatory Visit | Attending: Cardiovascular Disease | Admitting: Cardiovascular Disease

## 2016-12-03 DIAGNOSIS — R0602 Shortness of breath: Secondary | ICD-10-CM | POA: Diagnosis present

## 2016-12-03 DIAGNOSIS — R079 Chest pain, unspecified: Secondary | ICD-10-CM | POA: Insufficient documentation

## 2016-12-03 DIAGNOSIS — I639 Cerebral infarction, unspecified: Secondary | ICD-10-CM | POA: Insufficient documentation

## 2016-12-03 DIAGNOSIS — I6522 Occlusion and stenosis of left carotid artery: Secondary | ICD-10-CM | POA: Diagnosis not present

## 2016-12-03 DIAGNOSIS — I10 Essential (primary) hypertension: Secondary | ICD-10-CM | POA: Diagnosis not present

## 2016-12-03 LAB — NM MYOCAR MULTI W/SPECT W/WALL MOTION / EF
CHL CUP NUCLEAR SSS: 2
CHL CUP RESTING HR STRESS: 65 {beats}/min
CSEPEDS: 46 s
CSEPHR: 95 %
Estimated workload: 6.7 METS
Exercise duration (min): 4 min
LV dias vol: 76 mL (ref 46–106)
LV sys vol: 30 mL
Peak HR: 137 {beats}/min
SDS: 0
SRS: 0
TID: 0.81

## 2016-12-03 MED ORDER — TECHNETIUM TC 99M TETROFOSMIN IV KIT
30.5500 | PACK | Freq: Once | INTRAVENOUS | Status: AC | PRN
  Administered 2016-12-03: 30.55 via INTRAVENOUS

## 2016-12-03 MED ORDER — TECHNETIUM TC 99M TETROFOSMIN IV KIT
12.7040 | PACK | Freq: Once | INTRAVENOUS | Status: AC | PRN
  Administered 2016-12-03: 12.704 via INTRAVENOUS

## 2016-12-11 ENCOUNTER — Other Ambulatory Visit (INDEPENDENT_AMBULATORY_CARE_PROVIDER_SITE_OTHER): Payer: Self-pay | Admitting: Vascular Surgery

## 2016-12-11 DIAGNOSIS — I6523 Occlusion and stenosis of bilateral carotid arteries: Secondary | ICD-10-CM

## 2016-12-15 ENCOUNTER — Ambulatory Visit (INDEPENDENT_AMBULATORY_CARE_PROVIDER_SITE_OTHER): Payer: Medicare Other | Admitting: Vascular Surgery

## 2016-12-15 ENCOUNTER — Encounter (INDEPENDENT_AMBULATORY_CARE_PROVIDER_SITE_OTHER): Payer: Self-pay | Admitting: Vascular Surgery

## 2016-12-15 ENCOUNTER — Ambulatory Visit (INDEPENDENT_AMBULATORY_CARE_PROVIDER_SITE_OTHER): Payer: Medicare Other

## 2016-12-15 VITALS — BP 157/74 | HR 68 | Resp 20 | Ht 67.0 in | Wt 184.0 lb

## 2016-12-15 DIAGNOSIS — M7989 Other specified soft tissue disorders: Secondary | ICD-10-CM | POA: Insufficient documentation

## 2016-12-15 DIAGNOSIS — I872 Venous insufficiency (chronic) (peripheral): Secondary | ICD-10-CM | POA: Insufficient documentation

## 2016-12-15 DIAGNOSIS — I10 Essential (primary) hypertension: Secondary | ICD-10-CM

## 2016-12-15 DIAGNOSIS — I739 Peripheral vascular disease, unspecified: Secondary | ICD-10-CM | POA: Diagnosis not present

## 2016-12-15 DIAGNOSIS — I6522 Occlusion and stenosis of left carotid artery: Secondary | ICD-10-CM

## 2016-12-15 DIAGNOSIS — K219 Gastro-esophageal reflux disease without esophagitis: Secondary | ICD-10-CM | POA: Diagnosis not present

## 2016-12-15 DIAGNOSIS — I6523 Occlusion and stenosis of bilateral carotid arteries: Secondary | ICD-10-CM | POA: Diagnosis not present

## 2016-12-15 DIAGNOSIS — E782 Mixed hyperlipidemia: Secondary | ICD-10-CM | POA: Diagnosis not present

## 2016-12-15 LAB — VAS US CAROTID
LCCADDIAS: 17 cm/s
LCCADSYS: 85 cm/s
LEFT ECA DIAS: -9 cm/s
LEFT VERTEBRAL DIAS: 14 cm/s
LICADDIAS: -29 cm/s
LICADSYS: -104 cm/s
LICAPDIAS: -32 cm/s
Left CCA prox dias: 18 cm/s
Left CCA prox sys: 116 cm/s
Left ICA prox sys: -114 cm/s
RCCAPDIAS: 12 cm/s
RIGHT CCA MID DIAS: 18 cm/s
RIGHT ECA DIAS: -12 cm/s
RIGHT VERTEBRAL DIAS: 22 cm/s
Right CCA prox sys: 86 cm/s
Right cca dist sys: -91 cm/s

## 2016-12-15 NOTE — Progress Notes (Signed)
MRN : [id number]  Jill Parrish is a 76 y.o. (05-Dec-1940) female who presents with chief complaint of  Chief Complaint  Patient presents with  . Carotid  .  History of Present Illness: The patient is seen for follow up evaluation of carotid stenosis. The carotid stenosis followed by ultrasound.   The patient denies amaurosis fugax. There is no recent history of TIA symptoms or focal motor deficits. There is no prior documented CVA.  The patient is taking enteric-coated aspirin 81 mg daily.  Today she is also c/o of left leg pain and left leg swelling.  She is recently back from the beach.  Walking makes the pain worse.  Elevation makes it better  The patient has a history of coronary artery disease, no recent episodes of angina or shortness of breath.  Recent stress test was normal low risk. There is a history of hyperlipidemia which is being treated with a statin.    Carotid Duplex done today shows <40%.  No change compared to last study   No outpatient prescriptions have been marked as taking for the 12/15/16 encounter (Office Visit) with Delana Meyer, Dolores Lory, MD.    Past Medical History:  Diagnosis Date  . Anxiety disorder   . Dyslipidemia   . GERD (gastroesophageal reflux disease)   . Hyperlipidemia   . Hypertension   . Migraine headache    with neurologic features  . Migraine headache with aura   . Pneumonia   . 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 TIA (transient ischemic attack) 12/18/12    Past Surgical History:  Procedure Laterality Date  . ABDOMINAL HYSTERECTOMY    . CATARACT EXTRACTION Bilateral   . KNEE SURGERY Right 2005  . NASAL SINUS SURGERY    . OVARIAN CYST REMOVAL    . PARTIAL HYSTERECTOMY  1985  . ROTATOR CUFF REPAIR Right 2007   arthroscopy  . TONSILLECTOMY  1947    Social History Social History  Substance Use Topics  . Smoking status: Never Smoker  . Smokeless tobacco: Never Used  . Alcohol use Yes     Comment: glass of red wine  ocassionally    Family History Family History  Problem Relation Age of Onset  . Stroke Mother   . Heart attack Father   . Breast cancer Sister 33  . Aneurysm Sister        Abdominal aortic aneurysm  . Stroke Maternal Aunt   . Breast cancer Maternal Aunt 80  . Stroke Maternal Grandfather   . Heart disease Paternal Grandmother   . Breast cancer Paternal Grandmother 59  . Heart disease Paternal Grandfather     Allergies  Allergen Reactions  . Erythromycin Base Shortness Of Breath  . Minocin [Minocycline Hcl] Itching and Rash  . Aspirin     Other reaction(s): Other (See Comments) GI upset with high doses.  . Atorvastatin     Other reaction(s): Muscle Pain  . Cefprozil     Other reaction(s): Dizziness, Other (See Comments) Abdominal pain.  Marland Kitchen Fexofenadine     Other reaction(s): Dizziness  . Loratadine     Other reaction(s): Dizziness Other reaction(s): Dizziness  . Sulfa Antibiotics Itching and Swelling  . Ciprofloxacin Rash  . Keflex [Cephalexin] Itching     REVIEW OF SYSTEMS (Negative unless checked)  Constitutional: [] Weight loss  [] Fever  [] Chills Cardiac: [] Chest pain   [] Chest pressure   [] Palpitations   [] Shortness of breath when laying flat   [] Shortness  of breath with exertion. Vascular:  [x] Pain in legs with walking   [x] Pain in legs at rest  [] History of DVT   [] Phlebitis   [x] Swelling in legs   [] Varicose veins   [] Non-healing ulcers Pulmonary:   [] Uses home oxygen   [] Productive cough   [] Hemoptysis   [] Wheeze  [] COPD   [] Asthma Neurologic:  [] Dizziness   [] Seizures   [] History of stroke   [] History of TIA  [] Aphasia   [] Vissual changes   [] Weakness or numbness in arm   [] Weakness or numbness in leg Musculoskeletal:   [] Joint swelling   [] Joint pain   [] Low back pain Hematologic:  [] Easy bruising  [] Easy bleeding   [] Hypercoagulable state   [] Anemic Gastrointestinal:  [] Diarrhea   [] Vomiting  [] Gastroesophageal reflux/heartburn   [] Difficulty  swallowing. Genitourinary:  [] Chronic kidney disease   [] Difficult urination  [] Frequent urination   [] Blood in urine Skin:  [] Rashes   [] Ulcers  Psychological:  [] History of anxiety   []  History of major depression.  Physical Examination  Vitals:   12/15/16 0944  BP: (!) 157/74  Pulse: 68  Resp: 20  Weight: 83.5 kg (184 lb)  Height: 5\' 7"  (1.702 m)   Body mass index is 28.82 kg/m. Gen: WD/WN, NAD Head: [place]/AT, No temporalis wasting.  Ear/Nose/Throat: Hearing grossly intact, nares w/o erythema or drainage Eyes: PER, EOMI, sclera nonicteric.  Neck: Supple, no large masses.   Pulmonary:  Good air movement, no audible wheezing bilaterally, no use of accessory muscles.  Cardiac: RRR, no JVD Vascular:  Bilateral carotid bruits noted 1+ edema of the right leg and ankle  3+ edema of the left leg and ankle. Vessel Right Left  Radial Palpable Palpable  Ulnar Palpable Palpable  Brachial Palpable Palpable  Carotid Palpable Palpable  PT Not Palpable Not Palpable  DP Not Palpable Not Palpable  Gastrointestinal: Non-distended. No guarding/no peritoneal signs.  Musculoskeletal: M/S 5/5 throughout.  No deformity or atrophy.  Neurologic: CN 2-12 intact. Symmetrical.  Speech is fluent. Motor exam as listed above. Psychiatric: Judgment intact, Mood & affect appropriate for pt's clinical situation. Dermatologic: mild venous changes bilaterally but no ulcers noted.  No changes consistent with cellulitis. Lymph : No lichenification or skin changes of chronic lymphedema.  CBC Lab Results  Component Value Date   WBC 9.9 12/28/2014   HGB 11.9 (L) 12/28/2014   HCT 34.6 (L) 12/28/2014   MCV 89.9 12/28/2014   PLT 235 12/28/2014    BMET    Component Value Date/Time   NA 139 12/28/2014 0425   NA 131 (L) 07/22/2013 1802   K 3.5 12/28/2014 0425   K 4.5 07/22/2013 1802   CL 107 12/28/2014 0425   CL 99 07/22/2013 1802   CO2 22 12/28/2014 0425   CO2 28 07/22/2013 1802   GLUCOSE 110 (H)  12/28/2014 0425   GLUCOSE 120 (H) 07/22/2013 1802   BUN 10 12/28/2014 0425   BUN 32 (H) 07/22/2013 1802   CREATININE 0.66 12/28/2014 0425   CREATININE 0.97 07/22/2013 1802   CALCIUM 7.6 (L) 12/28/2014 0425   CALCIUM 8.4 (L) 07/22/2013 1802   GFRNONAA >60 12/28/2014 0425   GFRNONAA 58 (L) 07/22/2013 1802   GFRAA >60 12/28/2014 0425   GFRAA >60 07/22/2013 1802   CrCl cannot be calculated (Patient's most recent lab result is older than the maximum 21 days allowed.).  COAG Lab Results  Component Value Date   INR 0.9 12/17/2012    Radiology Nm Myocar Multi W/spect W/wall Motion /  Ef  Result Date: 12/03/2016 Exercise myocardial perfusion imaging study with no significant  ischemia Normal wall motion, EF estimated at 59% No EKG changes concerning for ischemia at peak stress or in recovery. Rare PVCs with stress and in recovery. Low risk scan Signed, Esmond Plants, MD, Ph.D [facility]    Assessment/Plan 1. Stenosis of left carotid artery Recommend:  Given the patient's asymptomatic subcritical stenosis no further invasive testing or surgery at this time.  Duplex ultrasound shows <40% stenosis bilaterally.  Continue antiplatelet therapy as prescribed Continue management of CAD, HTN and Hyperlipidemia Healthy heart diet,  encouraged exercise at least 4 times per week Follow up in 12 months with duplex ultrasound and physical exam   2. PAD (peripheral artery disease) (HCC)  Recommend:  The patient has atypical pain symptoms for pure atherosclerotic disease. However, on physical exam there is evidence of mixed venous and arterial disease, given the diminished pulses and the edema associated with venous changes of the legs.  Noninvasive studies including ABI's and venous ultrasound of the legs will be obtained and the patient will follow up with me to review these studies.  The patient should continue walking and begin a more formal exercise program. The patient should continue his  antiplatelet therapy and aggressive treatment of the lipid abnormalities.  The patient should begin wearing graduated compression socks 15-20 mmHg strength to control edema.   3. Chronic venous insufficiency No surgery or intervention at this point in time.    I have had a long discussion with the patient regarding venous insufficiency and why it  causes symptoms. I have discussed with the patient the chronic skin changes that accompany venous insufficiency and the long term sequela such as infection and ulceration.  Patient will begin wearing graduated compression stockings class 1 (20-30 mmHg) or compression wraps on a daily basis a prescription was given. The patient will put the stockings on first thing in the morning and removing them in the evening. The patient is instructed specifically not to sleep in the stockings.    In addition, behavioral modification including several periods of elevation of the lower extremities during the day will be continued. I have demonstrated that proper elevation is a position with the ankles at heart level.  The patient is instructed to begin routine exercise, especially walking on a daily basis  Patient should undergo duplex ultrasound of the left leg venous system to ensure that DVT or reflux is not present.  Following the review of the ultrasound the patient will follow up in 2-3 months to reassess the degree of swelling and the control that graduated compression stockings or compression wraps  is offering.   The patient can be assessed for a Lymph Pump at that time  4. Leg swelling See #2&3  5. Essential hypertension Continue antihypertensive medications as already ordered, these medications have been reviewed and there are no changes at this time.   6. Mixed hyperlipidemia Continue statin as ordered and reviewed, no changes at this time   7. Gastroesophageal reflux disease without esophagitis Continue H2 blocker as already ordered, these  medications have been reviewed and there are no changes at this time.     Hortencia Pilar, MD  12/15/2016 9:49 AM

## 2017-01-09 DIAGNOSIS — Z8719 Personal history of other diseases of the digestive system: Secondary | ICD-10-CM | POA: Insufficient documentation

## 2017-02-02 ENCOUNTER — Encounter (INDEPENDENT_AMBULATORY_CARE_PROVIDER_SITE_OTHER): Payer: Medicare Other

## 2017-02-02 ENCOUNTER — Ambulatory Visit (INDEPENDENT_AMBULATORY_CARE_PROVIDER_SITE_OTHER): Payer: Medicare Other | Admitting: Vascular Surgery

## 2017-02-11 ENCOUNTER — Other Ambulatory Visit (INDEPENDENT_AMBULATORY_CARE_PROVIDER_SITE_OTHER): Payer: Self-pay | Admitting: Vascular Surgery

## 2017-02-11 DIAGNOSIS — M79606 Pain in leg, unspecified: Secondary | ICD-10-CM

## 2017-02-12 ENCOUNTER — Ambulatory Visit (INDEPENDENT_AMBULATORY_CARE_PROVIDER_SITE_OTHER): Payer: Medicare Other | Admitting: Vascular Surgery

## 2017-02-12 ENCOUNTER — Ambulatory Visit (INDEPENDENT_AMBULATORY_CARE_PROVIDER_SITE_OTHER): Payer: Medicare Other

## 2017-02-12 ENCOUNTER — Encounter (INDEPENDENT_AMBULATORY_CARE_PROVIDER_SITE_OTHER): Payer: Self-pay | Admitting: Vascular Surgery

## 2017-02-12 VITALS — BP 170/81 | HR 68 | Resp 15 | Ht 67.0 in | Wt 181.0 lb

## 2017-02-12 DIAGNOSIS — I10 Essential (primary) hypertension: Secondary | ICD-10-CM | POA: Diagnosis not present

## 2017-02-12 DIAGNOSIS — I739 Peripheral vascular disease, unspecified: Secondary | ICD-10-CM | POA: Diagnosis not present

## 2017-02-12 DIAGNOSIS — E782 Mixed hyperlipidemia: Secondary | ICD-10-CM | POA: Diagnosis not present

## 2017-02-12 DIAGNOSIS — M79606 Pain in leg, unspecified: Secondary | ICD-10-CM | POA: Diagnosis not present

## 2017-02-12 DIAGNOSIS — I872 Venous insufficiency (chronic) (peripheral): Secondary | ICD-10-CM | POA: Diagnosis not present

## 2017-02-12 DIAGNOSIS — M7989 Other specified soft tissue disorders: Secondary | ICD-10-CM

## 2017-02-15 NOTE — Progress Notes (Signed)
MRN : [id number]  Jill Parrish is a 76 y.o. ([birth year]) female who presents with chief complaint of  Chief Complaint  Patient presents with  . Re-evaluation    Ultrasound follow up  .  History of Present Illness: The patient is seen for evaluation of painful lower extremities. Patient notes the pain is variable and not always associated with activity.  The pain is somewhat consistent day to day occurring on most days. The patient notes the pain also occurs with standing and routinely seems worse as the day wears on. The pain has been progressive over the past several years. The patient states these symptoms are causing  a profound negative impact on quality of life and daily activities.  The patient denies rest pain or dangling of an extremity off the side of the bed during the night for relief. No open wounds or sores at this time. No history of DVT or phlebitis. No prior interventions or surgeries.  There is a  history of back problems and DJD of the lumbar and sacral spine.   No outpatient prescriptions have been marked as taking for the 02/12/17 encounter (Office Visit) with Delana Meyer, Dolores Lory, MD.    Past Medical History:  Diagnosis Date  . Anxiety disorder   . Dyslipidemia   . GERD (gastroesophageal reflux disease)   . Hyperlipidemia   . Hypertension   . Migraine headache    with neurologic features  . Migraine headache with aura   . Pneumonia   . 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 TIA (transient ischemic attack) 12/18/12    Past Surgical History:  Procedure Laterality Date  . ABDOMINAL HYSTERECTOMY    . CATARACT EXTRACTION Bilateral   . KNEE SURGERY Right 2005  . NASAL SINUS SURGERY    . OVARIAN CYST REMOVAL    . PARTIAL HYSTERECTOMY  1985  . ROTATOR CUFF REPAIR Right 2007   arthroscopy  . TONSILLECTOMY  1947    Social History Social History  Substance Use Topics  . Smoking status: Never Smoker  . Smokeless tobacco: Never Used  . Alcohol use  Yes     Comment: glass of red wine ocassionally    Family History Family History  Problem Relation Age of Onset  . Stroke Mother   . Heart attack Father   . Breast cancer Sister 74  . Aneurysm Sister        Abdominal aortic aneurysm  . Stroke Maternal Aunt   . Breast cancer Maternal Aunt 80  . Stroke Maternal Grandfather   . Heart disease Paternal Grandmother   . Breast cancer Paternal Grandmother 3  . Heart disease Paternal Grandfather     Allergies  Allergen Reactions  . Erythromycin Base Shortness Of Breath  . Minocin [Minocycline Hcl] Itching and Rash  . Aspirin     Other reaction(s): Other (See Comments) GI upset with high doses.  . Atorvastatin     Other reaction(s): Muscle Pain  . Cefprozil     Other reaction(s): Dizziness, Other (See Comments) Abdominal pain.  . Erythromycin     Other reaction(s): Dizziness (intolerance) Other reaction(s): Dizziness  . Fexofenadine     Other reaction(s): Dizziness  . Loratadine     Other reaction(s): Dizziness Other reaction(s): Dizziness  . Minocycline     Other reaction(s): Dizziness (intolerance), GI Upset (intolerance) GI upset.  . Sulfa Antibiotics Itching and Swelling  . Ciprofloxacin Rash  . Keflex [Cephalexin] Itching  REVIEW OF SYSTEMS (Negative unless checked)  Constitutional: [] Weight loss  [] Fever  [] Chills Cardiac: [] Chest pain   [] Chest pressure   [] Palpitations   [] Shortness of breath when laying flat   [] Shortness of breath with exertion. Vascular:  [] Pain in legs with walking   [] Pain in legs at rest  [] History of DVT   [] Phlebitis   [] Swelling in legs   [] Varicose veins   [] Non-healing ulcers Pulmonary:   [] Uses home oxygen   [] Productive cough   [] Hemoptysis   [] Wheeze  [] COPD   [] Asthma Neurologic:  [] Dizziness   [] Seizures   [] History of stroke   [] History of TIA  [] Aphasia   [] Vissual changes   [] Weakness or numbness in arm   [] Weakness or numbness in leg Musculoskeletal:   [] Joint swelling    [] Joint pain   [] Low back pain Hematologic:  [] Easy bruising  [] Easy bleeding   [] Hypercoagulable state   [] Anemic Gastrointestinal:  [] Diarrhea   [] Vomiting  [] Gastroesophageal reflux/heartburn   [] Difficulty swallowing. Genitourinary:  [] Chronic kidney disease   [] Difficult urination  [] Frequent urination   [] Blood in urine Skin:  [] Rashes   [] Ulcers  Psychological:  [] History of anxiety   []  History of major depression.  Physical Examination  Vitals:   02/12/17 1448  BP: (!) 170/81  Pulse: 68  Resp: 15  Weight: 181 lb (82.1 kg)  Height: 5\' 7"  (1.702 m)   Body mass index is 28.35 kg/m. Gen: WD/WN, NAD Head: [place]/AT, No temporalis wasting.  Ear/Nose/Throat: Hearing grossly intact, nares w/o erythema or drainage Eyes: PER, EOMI, sclera nonicteric.  Neck: Supple, no large masses.   Pulmonary:  Good air movement, no audible wheezing bilaterally, no use of accessory muscles.  Cardiac: RRR, no JVD Vascular: Large varicosities present extensively greater than 3-5 mm bilaterally.  Mild venous stasis changes to the legs bilaterally.  2+ soft pitting edema Vessel Right Left  Radial Palpable Palpable  Ulnar Palpable Palpable  Brachial Palpable Palpable  Carotid Palpable Palpable  Femoral Palpable Palpable  Popliteal Not Palpable Not Palpable  PT Not Palpable Not Palpable  DP Not Palpable Not Palpable  Gastrointestinal: Non-distended. No guarding/no peritoneal signs.  Musculoskeletal: M/S 5/5 throughout.  No deformity or atrophy.  Neurologic: CN 2-12 intact. Symmetrical.  Speech is fluent. Motor exam as listed above. Psychiatric: Judgment intact, Mood & affect appropriate for pt's clinical situation. Dermatologic: No rashes or ulcers noted.  No changes consistent with cellulitis. Lymph : No lichenification or skin changes of chronic lymphedema.  CBC Lab Results  Component Value Date   WBC 9.9 12/28/2014   HGB 11.9 (L) 12/28/2014   HCT 34.6 (L) 12/28/2014   MCV 89.9 12/28/2014    PLT 235 12/28/2014    BMET    Component Value Date/Time   NA 139 12/28/2014 0425   NA 131 (L) 07/22/2013 1802   K 3.5 12/28/2014 0425   K 4.5 07/22/2013 1802   CL 107 12/28/2014 0425   CL 99 07/22/2013 1802   CO2 22 12/28/2014 0425   CO2 28 07/22/2013 1802   GLUCOSE 110 (H) 12/28/2014 0425   GLUCOSE 120 (H) 07/22/2013 1802   BUN 10 12/28/2014 0425   BUN 32 (H) 07/22/2013 1802   CREATININE 0.66 12/28/2014 0425   CREATININE 0.97 07/22/2013 1802   CALCIUM 7.6 (L) 12/28/2014 0425   CALCIUM 8.4 (L) 07/22/2013 1802   GFRNONAA >60 12/28/2014 0425   GFRNONAA 58 (L) 07/22/2013 1802   GFRAA >60 12/28/2014 0425   GFRAA >60 07/22/2013 1802  CrCl cannot be calculated (Patient's most recent lab result is older than the maximum 21 days allowed.).  COAG Lab Results  Component Value Date   INR 0.9 12/17/2012    Radiology No results found.   Assessment/Plan 1. Chronic venous insufficiency No surgery or intervention at this point in time.    I have had a long discussion with the patient regarding venous insufficiency and why it  causes symptoms. I have discussed with the patient the chronic skin changes that accompany venous insufficiency and the long term sequela such as infection and ulceration.  Patient will begin wearing graduated compression stockings class 1 (20-30 mmHg) or compression wraps on a daily basis a prescription was given. The patient will put the stockings on first thing in the morning and removing them in the evening. The patient is instructed specifically not to sleep in the stockings.    In addition, behavioral modification including several periods of elevation of the lower extremities during the day will be continued. I have demonstrated that proper elevation is a position with the ankles at heart level.  The patient is instructed to begin routine exercise, especially walking on a daily basis  Following the review of the ultrasound the patient will follow up in  12 months to reassess the degree of swelling and the control that graduated compression stockings or compression wraps  is offering.   The patient can be assessed for a Lymph Pump at that time  2. PAD (peripheral artery disease) (HCC)  Recommend:  The patient has evidence of atherosclerosis of the lower extremities with claudication.  The patient does not voice lifestyle limiting changes at this point in time.  Noninvasive studies do not suggest clinically significant change.  No invasive studies, angiography or surgery at this time The patient should continue walking and begin a more formal exercise program.  The patient should continue antiplatelet therapy and aggressive treatment of the lipid abnormalities  No changes in the patient's medications at this time  The patient should continue wearing graduated compression socks 10-15 mmHg strength to control the mild edema.    3. Essential hypertension Continue antihypertensive medications as already ordered, these medications have been reviewed and there are no changes at this time.   4. Leg swelling No surgery or intervention at this point in time.    I have reviewed my discussion with the patient regarding venous insufficiency and secondary lymph edema and why it  causes symptoms. I have discussed with the patient the chronic skin changes that accompany these problems and the long term sequela such as ulceration and infection.  Patient will continue wearing graduated compression stockings class 1 (20-30 mmHg) on a daily basis a prescription was given to the patient to keep this updated. The patient will  put the stockings on first thing in the morning and removing them in the evening. The patient is instructed specifically not to sleep in the stockings.  In addition, behavioral modification including elevation during the day will be continued.  Diet and salt restriction was also discussed.  Previous duplex ultrasound of the lower  extremities shows normal deep venous system, superficial reflux was not present.   Following the review of the ultrasound the patient will follow up in 12 months to reassess the degree of swelling and the control that graduated compression is offering.   The patient can be assessed for a Lymph Pump at that time.  However, at this time the patient states they are satisfied with the  control compression and elevation is yielding.    5. Mixed hyperlipidemia Continue statin as ordered and reviewed, no changes at this time     Hortencia Pilar, MD  02/15/2017 5:02 PM

## 2017-02-25 ENCOUNTER — Ambulatory Visit (INDEPENDENT_AMBULATORY_CARE_PROVIDER_SITE_OTHER): Payer: Medicare Other

## 2017-02-25 ENCOUNTER — Ambulatory Visit: Payer: Medicare Other

## 2017-02-25 ENCOUNTER — Encounter: Payer: Self-pay | Admitting: Podiatry

## 2017-02-25 ENCOUNTER — Ambulatory Visit (INDEPENDENT_AMBULATORY_CARE_PROVIDER_SITE_OTHER): Payer: Medicare Other | Admitting: Podiatry

## 2017-02-25 DIAGNOSIS — M76829 Posterior tibial tendinitis, unspecified leg: Secondary | ICD-10-CM

## 2017-02-25 DIAGNOSIS — M779 Enthesopathy, unspecified: Secondary | ICD-10-CM

## 2017-02-25 MED ORDER — METHYLPREDNISOLONE 4 MG PO TBPK: ORAL_TABLET | ORAL | 0 refills | Status: DC

## 2017-02-25 NOTE — Progress Notes (Signed)
   Subjective:    Patient ID: Jill Parrish, female    DOB: 1940/12/10, 76 y.o.   MRN: [id number]  HPI: She presents today for chief complaint of pain to the medial foot left. Cyst with aching aching now for about 2 months since the getting worse. She states there is some swelling she tried Aleve and compress sleep nothing really seems to help. She denies any trauma.    Review of Systems  Constitutional: Positive for fatigue.  HENT: Positive for ear pain, sinus pressure and tinnitus.   Respiratory: Positive for shortness of breath.   Neurological: Positive for light-headedness and headaches.  Hematological: Bruises/bleeds easily.  All other systems reviewed and are negative.      Objective:   Physical Exam: Vital signs are stable alert and oriented 3. Pulses are strongly palpable. Neurologic sensorium is intact. Degenerative flexors are intact. Muscle strength is normal bilateral. Physical tenderness on palpation of the posterior tibial tendon as it courses beneath the medial malleolus extension navicular tuberosity. She also has some tenderness in the posterior aspect of the medial ankle as well. Radiographs do not demonstrate any trauma. 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  Plan: I injected the area today along the medial aspect of the foot with dexamethasone and local anesthetic. I also wrote a prescription for Medrol Dosepak and recommended that she follow up with me in a month or so but also recommended that she wear appropriate shoe gear consisting of tennis shoes.

## 2017-03-19 ENCOUNTER — Ambulatory Visit: Payer: Medicare Other | Attending: Neurology

## 2017-03-19 DIAGNOSIS — G4733 Obstructive sleep apnea (adult) (pediatric): Secondary | ICD-10-CM | POA: Insufficient documentation

## 2017-09-29 DIAGNOSIS — E119 Type 2 diabetes mellitus without complications: Secondary | ICD-10-CM | POA: Insufficient documentation

## 2017-09-29 HISTORY — DX: Type 2 diabetes mellitus without complications: E11.9

## 2018-01-13 ENCOUNTER — Encounter: Payer: Self-pay | Admitting: Podiatry

## 2018-01-13 ENCOUNTER — Ambulatory Visit: Payer: Medicare Other | Admitting: Podiatry

## 2018-01-13 ENCOUNTER — Ambulatory Visit (INDEPENDENT_AMBULATORY_CARE_PROVIDER_SITE_OTHER): Payer: Medicare Other

## 2018-01-13 ENCOUNTER — Encounter

## 2018-01-13 DIAGNOSIS — M779 Enthesopathy, unspecified: Secondary | ICD-10-CM | POA: Diagnosis not present

## 2018-01-13 DIAGNOSIS — M7752 Other enthesopathy of left foot: Secondary | ICD-10-CM

## 2018-01-13 NOTE — Progress Notes (Signed)
She presents today for chief complaint of left ankle pain x2 weeks reports that she twisted her ankle 2 weeks ago now she gets sharp stabbing pains when she is walking a lot she is been using ice and stretching to relieve her symptoms.  Objective: Vital signs are stable alert and oriented x3.  Pulses are palpable.  Neurologic sensorium is intact.  Degenerative flexors are intact he has tenderness on palpation of the anterior talofibular ligament also in the anterior medial tibia just above the medial malleolus.  Radiographs taken today demonstrate what appears to be a possible crack in the cortex in this area the anterior tibia just above the medial malleolus.  There is no dislocation no fragmentation no comminution.  Assessment: Ankle sprain cannot rule out tibial fracture.  Plan: Placed her in a Tri-Lock brace we will follow-up with her in the near future.

## 2018-02-15 ENCOUNTER — Ambulatory Visit (INDEPENDENT_AMBULATORY_CARE_PROVIDER_SITE_OTHER): Payer: Medicare Other | Admitting: Vascular Surgery

## 2018-02-15 ENCOUNTER — Encounter (INDEPENDENT_AMBULATORY_CARE_PROVIDER_SITE_OTHER): Payer: Self-pay | Admitting: Vascular Surgery

## 2018-02-15 VITALS — BP 154/82 | HR 67 | Resp 16 | Ht 67.0 in | Wt 166.4 lb

## 2018-02-15 DIAGNOSIS — R600 Localized edema: Secondary | ICD-10-CM | POA: Diagnosis not present

## 2018-02-15 DIAGNOSIS — I10 Essential (primary) hypertension: Secondary | ICD-10-CM

## 2018-02-15 DIAGNOSIS — I872 Venous insufficiency (chronic) (peripheral): Secondary | ICD-10-CM | POA: Diagnosis not present

## 2018-02-15 DIAGNOSIS — M7989 Other specified soft tissue disorders: Secondary | ICD-10-CM

## 2018-02-15 DIAGNOSIS — E782 Mixed hyperlipidemia: Secondary | ICD-10-CM | POA: Diagnosis not present

## 2018-02-15 NOTE — Progress Notes (Signed)
MRN : [id number]  Jill Parrish is a 77 y.o. (1941-04-17) female who presents with chief complaint of  Chief Complaint  Patient presents with  . Follow-up    11yr with no studies  .  History of Present Illness: The patient returns to the office for followup evaluation regarding leg swelling.  The swelling has improved quite a bit and the pain associated with swelling has decreased substantially. There have not been any interval development of a ulcerations or wounds.  Since the previous visit the patient has been wearing graduated compression stockings and has noted little significant improvement in the lymphedema. The patient has been using compression routinely morning until night.  The patient also states elevation during the day and exercise is being done too.    Current Meds  Medication Sig  . acetaminophen (TYLENOL) 500 MG tablet Take 500 mg by mouth every 6 (six) hours as needed for mild pain or headache.  Marland Kitchen amLODipine (NORVASC) 5 MG tablet Take 5 mg by mouth daily.  Marland Kitchen aspirin EC 81 MG tablet Take 81 mg by mouth daily.  . clobetasol ointment (TEMOVATE) 0.05 % Apply topically.  . diclofenac sodium (VOLTAREN) 1 % GEL Apply 2 g topically as needed (for pain).  Marland Kitchen EPINEPHrine 0.3 mg/0.3 mL IJ SOAJ injection Inject into the muscle.  . ezetimibe (ZETIA) 10 MG tablet   . Misc Natural Products (OSTEO BI-FLEX JOINT SHIELD PO) Take by mouth as needed.  . Nutritional Supplements (JUICE PLUS FIBRE PO) Take 1 capsule by mouth daily.   . SUMAtriptan (IMITREX) 100 MG tablet Take by mouth.  . telmisartan-hydrochlorothiazide (MICARDIS HCT) 80-12.5 MG per tablet Take 1 tablet by mouth daily.  . vitamin E 600 UNIT capsule Take by mouth.    Past Medical History:  Diagnosis Date  . Anxiety disorder   . Dyslipidemia   . GERD (gastroesophageal reflux disease)   . Hyperlipidemia   . Hypertension   . Migraine headache    with neurologic features  . Migraine headache with aura   .  Pneumonia   . 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 TIA (transient ischemic attack) 12/18/12    Past Surgical History:  Procedure Laterality Date  . ABDOMINAL HYSTERECTOMY    . CATARACT EXTRACTION Bilateral   . KNEE SURGERY Right 2005  . NASAL SINUS SURGERY    . OVARIAN CYST REMOVAL    . PARTIAL HYSTERECTOMY  1985  . ROTATOR CUFF REPAIR Right 2007   arthroscopy  . TONSILLECTOMY  1947    Social History Social History   Tobacco Use  . Smoking status: Never Smoker  . Smokeless tobacco: Never Used  Substance Use Topics  . Alcohol use: Yes    Comment: glass of red wine ocassionally  . Drug use: No    Family History Family History  Problem Relation Age of Onset  . Stroke Mother   . Heart attack Father   . Breast cancer Sister 1  . Aneurysm Sister        Abdominal aortic aneurysm  . Stroke Maternal Aunt   . Breast cancer Maternal Aunt 80  . Stroke Maternal Grandfather   . Heart disease Paternal Grandmother   . Breast cancer Paternal Grandmother 60  . Heart disease Paternal Grandfather     Allergies  Allergen Reactions  . Erythromycin Base Shortness Of Breath  . Minocin [Minocycline Hcl] Itching and Rash  . Aspirin     Other reaction(s): Other (See Comments)  GI upset with high doses.  . Atorvastatin     Other reaction(s): Muscle Pain  . Cefprozil     Other reaction(s): Dizziness, Other (See Comments) Abdominal pain.  . Erythromycin     Other reaction(s): Dizziness (intolerance) Other reaction(s): Dizziness  . Fexofenadine     Other reaction(s): Dizziness  . Loratadine     Other reaction(s): Dizziness Other reaction(s): Dizziness  . Minocycline     Other reaction(s): Dizziness (intolerance), GI Upset (intolerance) GI upset.  . Sulfa Antibiotics Itching and Swelling  . Ciprofloxacin Rash  . Keflex [Cephalexin] Itching     REVIEW OF SYSTEMS (Negative unless checked)  Constitutional: [] Weight loss  [] Fever  [] Chills Cardiac: [] Chest pain   [] Chest  pressure   [] Palpitations   [] Shortness of breath when laying flat   [] Shortness of breath with exertion. Vascular:  [] Pain in legs with walking   [] Pain in legs at rest  [] History of DVT   [] Phlebitis   [x] Swelling in legs   [x] Varicose veins   [] Non-healing ulcers Pulmonary:   [] Uses home oxygen   [] Productive cough   [] Hemoptysis   [] Wheeze  [] COPD   [] Asthma Neurologic:  [] Dizziness   [] Seizures   [] History of stroke   [] History of TIA  [] Aphasia   [] Vissual changes   [] Weakness or numbness in arm   [] Weakness or numbness in leg Musculoskeletal:   [] Joint swelling   [] Joint pain   [] Low back pain Hematologic:  [] Easy bruising  [] Easy bleeding   [] Hypercoagulable state   [] Anemic Gastrointestinal:  [] Diarrhea   [] Vomiting  [] Gastroesophageal reflux/heartburn   [] Difficulty swallowing. Genitourinary:  [] Chronic kidney disease   [] Difficult urination  [] Frequent urination   [] Blood in urine Skin:  [] Rashes   [] Ulcers  Psychological:  [] History of anxiety   []  History of major depression.  Physical Examination  Vitals:   02/15/18 0859  BP: (!) 154/82  Pulse: 67  Resp: 16  Weight: 166 lb 6.4 oz (75.5 kg)  Height: 5\' 7"  (1.702 m)   Body mass index is 26.06 kg/m. Gen: WD/WN, NAD Head: [place]/AT, No temporalis wasting.  Ear/Nose/Throat: Hearing grossly intact, nares w/o erythema or drainage Eyes: PER, EOMI, sclera nonicteric.  Neck: Supple, no large masses.   Pulmonary:  Good air movement, no audible wheezing bilaterally, no use of accessory muscles.  Cardiac: RRR, no JVD Vascular: scattered varicosities present bilaterally.  Mild venous stasis changes to the legs bilaterally.  2+ soft pitting edema Vessel Right Left  Radial Palpable Palpable  PT Palpable Palpable  DP Palpable Palpable  Gastrointestinal: Non-distended. No guarding/no peritoneal signs.  Musculoskeletal: M/S 5/5 throughout.  No deformity or atrophy.  Neurologic: CN 2-12 intact. Symmetrical.  Speech is fluent. Motor exam as  listed above. Psychiatric: Judgment intact, Mood & affect appropriate for pt's clinical situation. Dermatologic: venous rashes no ulcers noted.  No changes consistent with cellulitis. Lymph : No lichenification or skin changes of chronic lymphedema.  CBC Lab Results  Component Value Date   WBC 9.9 12/28/2014   HGB 11.9 (L) 12/28/2014   HCT 34.6 (L) 12/28/2014   MCV 89.9 12/28/2014   PLT 235 12/28/2014    BMET    Component Value Date/Time   NA 139 12/28/2014 0425   NA 131 (L) 07/22/2013 1802   K 3.5 12/28/2014 0425   K 4.5 07/22/2013 1802   CL 107 12/28/2014 0425   CL 99 07/22/2013 1802   CO2 22 12/28/2014 0425   CO2 28 07/22/2013 1802   GLUCOSE  110 (H) 12/28/2014 0425   GLUCOSE 120 (H) 07/22/2013 1802   BUN 10 12/28/2014 0425   BUN 32 (H) 07/22/2013 1802   CREATININE 0.66 12/28/2014 0425   CREATININE 0.97 07/22/2013 1802   CALCIUM 7.6 (L) 12/28/2014 0425   CALCIUM 8.4 (L) 07/22/2013 1802   GFRNONAA >60 12/28/2014 0425   GFRNONAA 58 (L) 07/22/2013 1802   GFRAA >60 12/28/2014 0425   GFRAA >60 07/22/2013 1802   CrCl cannot be calculated (Patient's most recent lab result is older than the maximum 21 days allowed.).  COAG Lab Results  Component Value Date   INR 0.9 12/17/2012    Radiology No results found.    Assessment/Plan 1. Chronic venous insufficiency No surgery or intervention at this point in time.  I have reviewed my discussion with the patient regarding venous insufficiency and why it causes symptoms. I have discussed with the patient the chronic skin changes that accompany venous insufficiency and the long term sequela such as ulceration. Patient will contnue wearing graduated compression stockings on a daily basis, as this has provided excellent control of his edema. The patient will put the stockings on first thing in the morning and removing them in the evening. The patient is reminded not to sleep in the stockings.  In addition, behavioral  modification including elevation during the day will be initiated. Exercise is strongly encouraged.  Given the patient's good control and lack of any problems regarding the venous insufficiency and lymphedema a lymph pump in not need at this time.  The patient will follow up with me PRN should anything change.  The patient voices agreement with this plan.   2. Leg swelling No surgery or intervention at this point in time.  I have reviewed my discussion with the patient regarding venous insufficiency and why it causes symptoms. I have discussed with the patient the chronic skin changes that accompany venous insufficiency and the long term sequela such as ulceration. Patient will contnue wearing graduated compression stockings on a daily basis, as this has provided excellent control of his edema. The patient will put the stockings on first thing in the morning and removing them in the evening. The patient is reminded not to sleep in the stockings.  In addition, behavioral modification including elevation during the day will be initiated. Exercise is strongly encouraged.  Given the patient's good control and lack of any problems regarding the venous insufficiency and lymphedema a lymph pump in not need at this time.  The patient will follow up with me PRN should anything change.  The patient voices agreement with this plan.   3. Mixed hyperlipidemia Continue statin as ordered and reviewed, no changes at this time   4. Essential hypertension Continue antihypertensive medications as already ordered, these medications have been reviewed and there are no changes at this time.     Hortencia Pilar, MD  02/15/2018 10:12 PM

## 2018-02-22 ENCOUNTER — Ambulatory Visit (INDEPENDENT_AMBULATORY_CARE_PROVIDER_SITE_OTHER): Payer: Medicare Other | Admitting: Podiatry

## 2018-02-22 ENCOUNTER — Encounter: Payer: Self-pay | Admitting: Podiatry

## 2018-02-22 DIAGNOSIS — M779 Enthesopathy, unspecified: Secondary | ICD-10-CM

## 2018-02-22 DIAGNOSIS — M7752 Other enthesopathy of left foot: Secondary | ICD-10-CM

## 2018-02-22 NOTE — Progress Notes (Signed)
She presents today for follow-up of her left ankle pain.  She states that is doing much better than it was previously.  She continues to wear her brace and does fine with it.  Objective: Vital signs are stable she is alert and oriented x3.  No reproducible pain on palpation of the left foot.  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  Plan: Continue current therapies follow-up with me as needed.

## 2018-04-07 ENCOUNTER — Other Ambulatory Visit: Payer: Self-pay | Admitting: Nurse Practitioner

## 2018-04-07 DIAGNOSIS — C220 Liver cell carcinoma: Secondary | ICD-10-CM | POA: Insufficient documentation

## 2018-04-07 DIAGNOSIS — R1084 Generalized abdominal pain: Secondary | ICD-10-CM

## 2018-04-07 DIAGNOSIS — R7401 Elevation of levels of liver transaminase levels: Secondary | ICD-10-CM | POA: Insufficient documentation

## 2018-04-07 DIAGNOSIS — K769 Liver disease, unspecified: Secondary | ICD-10-CM

## 2018-04-07 HISTORY — DX: Liver cell carcinoma: C22.0

## 2018-04-14 ENCOUNTER — Ambulatory Visit
Admission: RE | Admit: 2018-04-14 | Discharge: 2018-04-14 | Disposition: A | Discharge status: Home / Self Care | Payer: Medicare Other | Source: Ambulatory Visit | Attending: Nurse Practitioner | Admitting: Nurse Practitioner

## 2018-04-14 DIAGNOSIS — D3502 Benign neoplasm of left adrenal gland: Secondary | ICD-10-CM | POA: Diagnosis not present

## 2018-04-14 DIAGNOSIS — K769 Liver disease, unspecified: Secondary | ICD-10-CM | POA: Diagnosis present

## 2018-04-14 DIAGNOSIS — R1084 Generalized abdominal pain: Secondary | ICD-10-CM

## 2018-04-14 MED ORDER — GADOXETATE DISODIUM 0.25 MMOL/ML IV SOLN
10.0000 mL | Freq: Once | INTRAVENOUS | Status: AC | PRN
  Administered 2018-04-14: 7 mL via INTRAVENOUS

## 2018-04-27 ENCOUNTER — Other Ambulatory Visit: Payer: Self-pay | Admitting: Neurology

## 2018-04-27 DIAGNOSIS — S0990XA Unspecified injury of head, initial encounter: Secondary | ICD-10-CM

## 2018-05-12 ENCOUNTER — Ambulatory Visit: Payer: Medicare Other

## 2018-11-19 ENCOUNTER — Telehealth: Payer: Self-pay

## 2018-11-19 NOTE — Telephone Encounter (Signed)
Called patient from recall list.  No answer. LMOV.  Needs an Evisit.

## 2018-12-09 NOTE — Telephone Encounter (Signed)
Called patient from recall.  No answer. LMOV.  This is the second attempt per recall list

## 2018-12-16 ENCOUNTER — Telehealth: Payer: Self-pay | Admitting: Cardiovascular Disease

## 2018-12-16 NOTE — Telephone Encounter (Signed)
Called patient from recall list.  No answer. LMOV.  This is the 3rd attempt per recall list.  Will delete recall.   

## 2018-12-16 NOTE — Telephone Encounter (Signed)
Pending

## 2018-12-20 ENCOUNTER — Telehealth: Payer: Medicare Other | Admitting: Cardiovascular Disease

## 2019-02-07 ENCOUNTER — Ambulatory Visit: Payer: Medicare Other | Admitting: Cardiovascular Disease

## 2019-02-18 NOTE — Progress Notes (Signed)
Cardiology Office Note  Date:  02/22/2019   ID:  Jill Parrish, DOB [birth year], MRN [id number]  PCP:  Derinda Late, MD   CC: weight loss, liver CA  HPI:  77-old woman with history of  Chest pain syndrome,  March 2004 Felt secondary to GERD,  normal Myoview treadmill stress test and echocardiogram.  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 Jacqlyn Larsen.  Stroke  May 2014  Right TIA May 2014 ,  left-sided paresthesias.  MRI brain and ultrasound carotids, echocardiogram   who presents for follow up of her chest pain symptoms   robotic assisted partial hepatectomy and en bloc cholecystectomy on 05/26/2018.  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   Weight down 40 pounds in 2 years  Total chol 168, LDL 88 HBA1C 6.3  Normal echo 2014 Carotid: There is 40-59% ICA stenosis. On the left  Lives in house with 3 floors, Walks dog  Nonsmoker  EKG personally reviewed by myself on todays visit Shows normal sinus rhythm with rate 70 bpm no significant ST or T-wave changes   PMH:   has a past medical history of Anxiety disorder, Dyslipidemia, GERD (gastroesophageal reflux disease), Hyperlipidemia, Hypertension, Migraine headache, Migraine headache with aura, Pneumonia, Stroke ([place]), and TIA (transient ischemic attack) (12/18/12).  PSH:    Past Surgical History:  Procedure Laterality Date  . ABDOMINAL HYSTERECTOMY    . CATARACT EXTRACTION Bilateral   . KNEE SURGERY Right 2005  . NASAL SINUS SURGERY    . OVARIAN CYST REMOVAL    . PARTIAL HYSTERECTOMY  1985  . ROTATOR CUFF REPAIR Right 2007   arthroscopy  . TONSILLECTOMY  1947    Current Outpatient Medications  Medication Sig Dispense Refill  . acetaminophen (TYLENOL) 500 MG tablet Take 500 mg by  mouth every 6 (six) hours as needed for mild pain or headache.    Marland Kitchen amLODipine (NORVASC) 5 MG tablet Take 5 mg by mouth daily.    Marland Kitchen aspirin EC 81 MG tablet Take 81 mg by mouth daily.    . Azelastine-Fluticasone (DYMISTA) 137-50 MCG/ACT SUSP 137 mcg by Each Nare route once daily.    . diclofenac sodium (VOLTAREN) 1 % GEL Apply 2 g topically as needed (for pain).    Marland Kitchen EPINEPHrine 0.3 mg/0.3 mL IJ SOAJ injection Inject into the muscle.    . ezetimibe (ZETIA) 10 MG tablet     . SUMAtriptan (IMITREX) 100 MG tablet Take by mouth.    . telmisartan-hydrochlorothiazide (MICARDIS HCT) 80-12.5 MG per tablet Take 1 tablet by mouth daily.    . vitamin E 600 UNIT capsule Take by mouth.    . pantoprazole (PROTONIX) 40 MG tablet Take by mouth.     No current facility-administered medications for this visit.      Allergies:   Erythromycin base, Minocin [minocycline hcl], Aspirin, Atorvastatin, Cefprozil, Erythromycin, Fexofenadine, Loratadine, Minocycline, Sulfa antibiotics, Ciprofloxacin, and Keflex [cephalexin]   Social History:  The patient  reports that she has never smoked. She has never used smokeless tobacco. She reports current alcohol use. She reports that she does not use drugs.   Family History:   family history includes Aneurysm in her sister; Breast cancer (age of onset: 14) in her sister; Breast cancer (age of onset: 59) in her maternal aunt and paternal grandmother; Heart attack in her father;  Heart disease in her paternal grandfather and paternal grandmother; Stroke in her maternal aunt, maternal grandfather, and mother.    Review of Systems: Review of Systems  Constitutional: Negative.   Respiratory: Positive for shortness of breath.   Cardiovascular: Positive for chest pain.  Gastrointestinal: Negative.   Musculoskeletal: Negative.   Neurological: Negative.   Psychiatric/Behavioral: Negative.   All other systems reviewed and are negative.    PHYSICAL EXAM: VS:  BP (!) 146/70 (BP  Location: Left Arm, Patient Position: Sitting, Cuff Size: Normal)   Pulse 70   Ht 5\' 6"  (1.676 m)   Wt 141 lb 8 oz (64.2 kg)   BMI 22.84 kg/m  , BMI Body mass index is 22.84 kg/m. GEN: Well nourished, well developed, in no acute distress  HEENT: normal  Neck: no JVD, carotid bruits, or masses Cardiac: RRR; no murmurs, rubs, or gallops,no edema  Respiratory:  clear to auscultation bilaterally, normal work of breathing GI: soft, nontender, nondistended, + BS MS: no deformity or atrophy  Skin: warm and dry, no rash Neuro:  Strength and sensation are intact Psych: euthymic mood, full affect   Recent Labs: No results found for requested labs within last 8760 hours.    Lipid Panel Lab Results  Component Value Date   CHOL 240 (H) 12/18/2012   HDL 58 12/18/2012   LDLCALC 137 (H) 12/18/2012   TRIG 224 (H) 12/18/2012      Wt Readings from Last 3 Encounters:  02/22/19 141 lb 8 oz (64.2 kg)  02/15/18 166 lb 6.4 oz (75.5 kg)  02/12/17 181 lb (82.1 kg)       ASSESSMENT AND PLAN:  Chest pain, unspecified type -  Normal stress test 12/2016 Denies having significant chest pain No further work-up at this time  Essential hypertension -  Blood pressure mildly elevated, recommend she monitor blood pressure at home and call us if numbers continue to run high  Cerebrovascular accident (CVA), unspecified mechanism ([place])  aspirin 81 mg daily  moderate carotid disease on the left Reports she is not on Crestor, only on Zetia She will confirm she is not on the medication, Cholesterol slightly above goal.  Will hold off on restarting Crestor at this time until new lab work performed given dramatic weight loss  Stenosis of left carotid artery -  Previous carotid reviewed showing 40-59% disease on the left Consider repeat study in follow-up  Mixed hyperlipidemia Suggested she stay on her Zetia Was on Crestor 2018 but fell off her list Will wait for repeat lipid panel, goal LDL less  than 70  Shortness of breath Prior stress test with no ischemia Stable symptoms, recommend regular walking program  Chronic diarrhea Following surgery, recommend she talk with GI about adding cholestyramine or similar agent  Disposition:   F/U  12 months   Total encounter time more than 25 minutes  Greater than 50% was spent in counseling and coordination of care with the patient    Orders Placed This Encounter  Procedures  . EKG 12-Lead     Signed, Esmond Plants, M.D., Ph.D. 02/22/2019  [place], [place]

## 2019-02-22 ENCOUNTER — Encounter: Payer: Self-pay | Admitting: Cardiovascular Disease

## 2019-02-22 ENCOUNTER — Ambulatory Visit: Payer: Medicare Other | Admitting: Cardiovascular Disease

## 2019-02-22 ENCOUNTER — Other Ambulatory Visit: Payer: Self-pay

## 2019-02-22 VITALS — BP 146/70 | HR 70 | Ht 66.0 in | Wt 141.5 lb

## 2019-02-22 DIAGNOSIS — I739 Peripheral vascular disease, unspecified: Secondary | ICD-10-CM | POA: Diagnosis not present

## 2019-02-22 DIAGNOSIS — I872 Venous insufficiency (chronic) (peripheral): Secondary | ICD-10-CM

## 2019-02-22 DIAGNOSIS — I639 Cerebral infarction, unspecified: Secondary | ICD-10-CM

## 2019-02-22 DIAGNOSIS — I6522 Occlusion and stenosis of left carotid artery: Secondary | ICD-10-CM

## 2019-02-22 DIAGNOSIS — I10 Essential (primary) hypertension: Secondary | ICD-10-CM | POA: Diagnosis not present

## 2019-02-22 DIAGNOSIS — E782 Mixed hyperlipidemia: Secondary | ICD-10-CM

## 2019-02-22 NOTE — Patient Instructions (Addendum)
Medication Instructions:  Ask about cholestyramine for GI issues  If you need a refill on your cardiac medications before your next appointment, please call your pharmacy.    Lab work: No new labs needed   If you have labs (blood work) drawn today and your tests are completely normal, you will receive your results only by: Marland Kitchen MyChart Message (if you have MyChart) OR . A paper copy in the mail If you have any lab test that is abnormal or we need to change your treatment, we will call you to review the results.   Testing/Procedures: No new testing needed   Follow-Up: At [facility], you and your health needs are our priority.  As part of our continuing mission to provide you with exceptional heart care, we have created designated Provider Care Teams.  These Care Teams include your primary Cardiologist (physician) and Advanced Practice Providers (APPs -  Physician Assistants and Nurse Practitioners) who all work together to provide you with the care you need, when you need it.  . You will need a follow up appointment in 12 months .   Please call our office 2 months in advance to schedule this appointment.    . Providers on your designated Care Team:   . Murray Hodgkins, NP . Christell Faith, PA-C . Marrianne Mood, PA-C  Any Other Special Instructions Will Be Listed Below (If Applicable).  For educational health videos Log in to : [email or web address] Or : [email or web address], password : [email or web address]

## 2019-10-11 ENCOUNTER — Ambulatory Visit: Payer: Medicare PPO | Admitting: Podiatry

## 2019-11-28 ENCOUNTER — Ambulatory Visit: Payer: Medicare Other | Admitting: Dermatology

## 2019-12-01 ENCOUNTER — Ambulatory Visit (INDEPENDENT_AMBULATORY_CARE_PROVIDER_SITE_OTHER): Payer: Medicare PPO | Admitting: Nurse Practitioner

## 2019-12-01 ENCOUNTER — Encounter (INDEPENDENT_AMBULATORY_CARE_PROVIDER_SITE_OTHER): Payer: Self-pay | Admitting: Nurse Practitioner

## 2019-12-01 ENCOUNTER — Other Ambulatory Visit: Payer: Self-pay

## 2019-12-01 VITALS — BP 170/83 | HR 83 | Ht 67.0 in | Wt 142.0 lb

## 2019-12-01 DIAGNOSIS — I10 Essential (primary) hypertension: Secondary | ICD-10-CM

## 2019-12-01 DIAGNOSIS — R21 Rash and other nonspecific skin eruption: Secondary | ICD-10-CM | POA: Diagnosis not present

## 2019-12-01 DIAGNOSIS — M7989 Other specified soft tissue disorders: Secondary | ICD-10-CM

## 2019-12-05 ENCOUNTER — Encounter (INDEPENDENT_AMBULATORY_CARE_PROVIDER_SITE_OTHER): Payer: Self-pay | Admitting: Nurse Practitioner

## 2019-12-05 NOTE — Progress Notes (Signed)
Subjective:    Patient ID: Jill Parrish, female    DOB: May 10, 1941, 79 y.o.   MRN: [id number] Chief Complaint  Patient presents with  . Follow-up    vasculitis LS    Patient presents today as a referral from her primary care provider with concern for vasculitis.  The patient initially started with swelling and itching in her bilateral lower extremities.  However, today the itching has progressed to all over her body.  The patient states that the worst areas are her legs as well as in her neck.  Patient has gotten to be constant to where it is now affecting her sleep.  Even the slightest touch causes itching all over.  The patient also notes that the areas where she is scratching become red and ropelike.  She denies any fever, chills, nausea, vomiting or diarrhea.  She denies any exposure to any agents that may cause AN allergic reaction.  The patient's lower extremity edema has been mostly well controlled.   Review of Systems  Skin: Positive for rash.       Itching  All other systems reviewed and are negative.      Objective:   Physical Exam Vitals reviewed.  Constitutional:      Appearance: Normal appearance.  Cardiovascular:     Rate and Rhythm: Normal rate and regular rhythm.  Musculoskeletal:     Right lower leg: Edema present.     Left lower leg: Edema present.  Skin:    General: Skin is warm and dry.     Findings: Rash present.  Neurological:     Mental Status: She is alert.     BP (!) 170/83   Pulse 83   Ht 5\' 7"  (1.702 m)   Wt 142 lb (64.4 kg)   BMI 22.24 kg/m   Past Medical History:  Diagnosis Date  . Anxiety disorder   . Dyslipidemia   . GERD (gastroesophageal reflux disease)   . Hyperlipidemia   . Hypertension   . Migraine headache    with neurologic features  . Migraine headache with aura   . Pneumonia   . Stroke [facility])    Left brain, posterior  . TIA (transient ischemic attack) 12/18/12    Social History   Socioeconomic History  . Marital  status: Widowed    Spouse name: Not on file  . Number of children: 2  . Years of education: 57  . Highest education level: Not on file  Occupational History    Employer: RETIRED  Tobacco Use  . Smoking status: Never Smoker  . Smokeless tobacco: Never Used  Substance and Sexual Activity  . Alcohol use: Yes    Comment: glass of red wine ocassionally  . Drug use: No  . Sexual activity: Not on file  Other Topics Concern  . Not on file  Social History Narrative  . Not on file   Social Determinants of Health   Financial Resource Strain:   . Difficulty of Paying Living Expenses:   Food Insecurity:   . Worried About Charity fundraiser in the Last Year:   . Arboriculturist in the Last Year:   Transportation Needs:   . Film/video editor (Medical):   Marland Kitchen Lack of Transportation (Non-Medical):   Physical Activity:   . Days of Exercise per Week:   . Minutes of Exercise per Session:   Stress:   . Feeling of Stress :   Social Connections:   . Frequency  of Communication with Friends and Family:   . Frequency of Social Gatherings with Friends and Family:   . Attends Religious Services:   . Active Member of Clubs or Organizations:   . Attends Archivist Meetings:   Marland Kitchen Marital Status:   Intimate Partner Violence:   . Fear of Current or Ex-Partner:   . Emotionally Abused:   Marland Kitchen Physically Abused:   . Sexually Abused:     Past Surgical History:  Procedure Laterality Date  . ABDOMINAL HYSTERECTOMY    . CATARACT EXTRACTION Bilateral   . KNEE SURGERY Right 2005  . NASAL SINUS SURGERY    . OVARIAN CYST REMOVAL    . PARTIAL HYSTERECTOMY  1985  . ROTATOR CUFF REPAIR Right 2007   arthroscopy  . TONSILLECTOMY  1947    Family History  Problem Relation Age of Onset  . Stroke Mother   . Heart attack Father   . Breast cancer Sister 63  . Aneurysm Sister        Abdominal aortic aneurysm  . Stroke Maternal Aunt   . Breast cancer Maternal Aunt 80  . Stroke Maternal  Grandfather   . Heart disease Paternal Grandmother   . Breast cancer Paternal Grandmother 57  . Heart disease Paternal Grandfather     Allergies  Allergen Reactions  . Erythromycin Base Shortness Of Breath  . Minocin [Minocycline Hcl] Itching and Rash  . Aspirin     Other reaction(s): Other (See Comments) GI upset with high doses.  . Atorvastatin     Other reaction(s): Muscle Pain  . Cefprozil     Other reaction(s): Dizziness, Other (See Comments) Abdominal pain.  . Erythromycin     Other reaction(s): Dizziness (intolerance) Other reaction(s): Dizziness  . Fexofenadine     Other reaction(s): Dizziness  . Loratadine     Other reaction(s): Dizziness Other reaction(s): Dizziness  . Minocycline     Other reaction(s): Dizziness (intolerance), GI Upset (intolerance) GI upset.  . Sulfa Antibiotics Itching and Swelling  . Ciprofloxacin Rash  . Keflex [Cephalexin] Itching       Assessment & Plan:   1. Rash and nonspecific skin eruption Patient was originally referred her vasculitis however we do not treat vasculitis we typically refer to rheumatology.  However, the patient's rash is all over her body and her itching is widespread.  Based on this I think a referral to dermatology would be more prudent to rule out other possible causes as well as a possible biopsy which would allow them to determine if vasculitis was present as well.  The patient symptoms are also somewhat concerning for chronic urticaria.  Advised the patient to try Claritin over-the-counter to see if this provides some relief of her symptoms until she can be seen by dermatology. - Ambulatory referral to Dermatology  2. Leg swelling Patient's leg swelling during fairly well at this point.  Patient is encouraged to wear her medical grade 1 compression stockings once the itching and rash is resolved.  3. Essential hypertension Continue antihypertensive medications as already ordered, these medications have been  reviewed and there are no changes at this time.    Current Outpatient Medications on File Prior to Visit  Medication Sig Dispense Refill  . acetaminophen (TYLENOL) 500 MG tablet Take 500 mg by mouth every 6 (six) hours as needed for mild pain or headache.    Marland Kitchen amLODipine (NORVASC) 5 MG tablet Take 5 mg by mouth daily.    Marland Kitchen aspirin EC 81 MG  tablet Take 81 mg by mouth daily.    . diclofenac sodium (VOLTAREN) 1 % GEL Apply 2 g topically as needed (for pain).    Marland Kitchen ezetimibe (ZETIA) 10 MG tablet     . pantoprazole (PROTONIX) 40 MG tablet Take by mouth.    . telmisartan-hydrochlorothiazide (MICARDIS HCT) 80-12.5 MG per tablet Take 1 tablet by mouth daily.    . vitamin E 600 UNIT capsule Take by mouth.    . Zinc 10 MG LOZG Take by mouth.    . Azelastine-Fluticasone (DYMISTA) 137-50 MCG/ACT SUSP 137 mcg by Each Nare route once daily.    Marland Kitchen EPINEPHrine 0.3 mg/0.3 mL IJ SOAJ injection Inject into the muscle.    . SUMAtriptan (IMITREX) 100 MG tablet Take by mouth.     No current facility-administered medications on file prior to visit.    There are no Patient Instructions on file for this visit. No follow-ups on file.   Kris Hartmann, NP

## 2020-01-30 ENCOUNTER — Ambulatory Visit: Payer: Medicare PPO | Admitting: Dermatology

## 2020-02-27 ENCOUNTER — Ambulatory Visit: Payer: Medicare PPO | Admitting: Cardiovascular Disease

## 2020-02-27 NOTE — Progress Notes (Signed)
Cardiology Office Note  Date:  02/28/2020   ID:  Jill Parrish, DOB Jun 03, 1941, MRN [id number]  PCP:  Derinda Late, MD   Chief Complaint  Patient presents with  . OTher    12 month follow up. Meds reviewed verbally with patient.     HPI:  78-old woman with history of  Chest pain syndrome,  March 2004 Felt secondary to GERD,  normal Myoview treadmill stress test and echocardiogram.  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 Jacqlyn Larsen.  Stroke  May 2014  Right TIA May 2014 ,  left-sided paresthesias.  MRI brain and ultrasound carotids, echocardiogram   who presents for follow up of her chest pain symptoms  Prior history includes robotic assisted partial hepatectomy and en bloc cholecystectomy on 05/26/2018.  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   MRI scan February 14, 2020 reviewed on today's visit Status post partial right hepatectomy without recurrence No new liver lesions or abdominal metastasis Benign left adrenal adenoma  Weight down 40 pounds in 2 years 2019-2020, Weight stable 2020 to  2021 Around 140 pounds Moderated her diet  Discussed her lower extremity edema, stable wears a tight sock Also discussed nocturia, concern of her water pill causing her symptoms  Lab work reviewed on today's visit Alpha-fetoprotein 2.1 A1c 6.1 Potassium 3.4, creatinine 0.7, normal LFTs Normal CBC Total cholesterol 164 LDL 86 in February 2021  No recent carotid ultrasound available, last in May 2018 Most recent ultrasound 2020 39% disease bilaterally  Non-smoker, active,  Adopted a dog, frequent walks  EKG personally reviewed by myself on todays visit Normal sinus rhythm rate 70 bpm no significant ST-T wave changes  PMH:   has a past medical history of Anxiety  disorder, Dyslipidemia, GERD (gastroesophageal reflux disease), Hyperlipidemia, Hypertension, Migraine headache, Migraine headache with aura, Pneumonia, Stroke ([place]), and TIA (transient ischemic attack) (12/18/12).  PSH:    Past Surgical History:  Procedure Laterality Date  . ABDOMINAL HYSTERECTOMY    . CATARACT EXTRACTION Bilateral   . KNEE SURGERY Right 2005  . NASAL SINUS SURGERY    . OVARIAN CYST REMOVAL    . PARTIAL HYSTERECTOMY  1985  . ROTATOR CUFF REPAIR Right 2007   arthroscopy  . TONSILLECTOMY  1947    Current Outpatient Medications  Medication Sig Dispense Refill  . acetaminophen (TYLENOL) 500 MG tablet Take 500 mg by mouth every 6 (six) hours as needed for mild pain or headache.    Marland Kitchen amLODipine (NORVASC) 5 MG tablet Take 5 mg by mouth daily.    Marland Kitchen aspirin EC 81 MG tablet Take 81 mg by mouth daily.    . Azelastine-Fluticasone (DYMISTA) 137-50 MCG/ACT SUSP 137 mcg by Each Nare route once daily.    . Cholecalciferol 125 MCG (5000 UT) TABS Take by mouth.    . diclofenac sodium (VOLTAREN) 1 % GEL Apply 2 g topically as needed (for pain).    Marland Kitchen EPINEPHrine 0.3 mg/0.3 mL IJ SOAJ injection Inject into the muscle.    . ezetimibe (ZETIA) 10 MG tablet     . SUMAtriptan (IMITREX) 100 MG tablet Take by mouth.    . telmisartan-hydrochlorothiazide (MICARDIS HCT) 80-12.5 MG per tablet Take 1 tablet by mouth daily.    . Zinc 10 MG LOZG Take by mouth.     No current facility-administered medications for this visit.  Allergies:   Erythromycin base, Minocin [minocycline hcl], Aspirin, Atorvastatin, Cefprozil, Erythromycin, Fexofenadine, Loratadine, Minocycline, Sulfa antibiotics, Ciprofloxacin, and Keflex [cephalexin]   Social History:  The patient  reports that she has never smoked. She has never used smokeless tobacco. She reports current alcohol use. She reports that she does not use drugs.   Family History:   family history includes Aneurysm in her sister; Breast cancer (age of onset:  82) in her sister; Breast cancer (age of onset: 75) in her maternal aunt and paternal grandmother; Heart attack in her father; Heart disease in her paternal grandfather and paternal grandmother; Stroke in her maternal aunt, maternal grandfather, and mother.    Review of Systems: Review of Systems  Constitutional: Negative.   HENT: Negative.   Respiratory: Negative.   Cardiovascular: Negative.   Gastrointestinal: Negative.   Musculoskeletal: Negative.   Neurological: Negative.   Psychiatric/Behavioral: Negative.   All other systems reviewed and are negative.   PHYSICAL EXAM: VS:  BP (!) 132/62 (BP Location: Left Arm, Patient Position: Sitting, Cuff Size: Normal)   Pulse 70   Ht 5\' 6"  (1.676 m)   Wt 141 lb (64 kg)   BMI 22.76 kg/m  , BMI Body mass index is 22.76 kg/m. Constitutional:  oriented to person, place, and time. No distress.  HENT:  Head: Grossly normal Eyes:  no discharge. No scleral icterus.  Neck: No JVD, no carotid bruits  Cardiovascular: Regular rate and rhythm, no murmurs appreciated Pulmonary/Chest: Clear to auscultation bilaterally, no wheezes or rails Abdominal: Soft.  no distension.  no tenderness.  Musculoskeletal: Normal range of motion Neurological:  normal muscle tone. Coordination normal. No atrophy Skin: Skin warm and dry Psychiatric: normal affect, pleasant   Recent Labs: No results found for requested labs within last 8760 hours.    Lipid Panel Lab Results  Component Value Date   CHOL 240 (H) 12/18/2012   HDL 58 12/18/2012   LDLCALC 137 (H) 12/18/2012   TRIG 224 (H) 12/18/2012      Wt Readings from Last 3 Encounters:  02/28/20 141 lb (64 kg)  12/01/19 142 lb (64.4 kg)  02/22/19 141 lb 8 oz (64.2 kg)       ASSESSMENT AND PLAN:  Chest pain, unspecified type -  Normal stress test 12/2016 Active, walking the dog, no symptoms of angina, no further testing needed  Essential hypertension -  Blood pressure well controlled on today's  visit Long discussion concerning her nocturia Suggested she cut back on her fluid intake at nighttime especially after 7 PM We have placed a new prescription for telmisartan HCTZ as separate medications She will hold the HCTZ and take as needed for worsening leg swelling We will take telmisartan on a daily basis with monitoring of blood pressure  Cerebrovascular accident (CVA), unspecified mechanism ([place])  aspirin 81 mg daily Mild bilateral carotid disease on ultrasound 2018 Most recent ultrasound less than 39% disease bilaterally on Zetia, not on a statin  Carotid stenosis/PAD Less than 39% disease bilaterally, monitored by vascular surgery, last 2018 Periodic ultrasound per vascular surgery  Mixed hyperlipidemia Previously on Crestor 2018 but fell off her list She is happy to stay on Zetia alone Numbers close to goal especially after weight loss past several years  Diet-controlled diabetes A1c 6.1, stable Does not appear to be on medications at this time  Leg edema Stable, likely component of venous insufficiency worse on the left than the right Recommend she continue wearing compression hose  Disposition:   F/U  12 months   Total encounter time more than 25 minutes  Greater than 50% was spent in counseling and coordination of care with the patient    Orders Placed This Encounter  Procedures  . EKG 12-Lead     Signed, Esmond Plants, M.D., Ph.D. 02/28/2020  [place], [place]

## 2020-02-28 ENCOUNTER — Encounter: Payer: Self-pay | Admitting: Cardiovascular Disease

## 2020-02-28 ENCOUNTER — Other Ambulatory Visit: Payer: Self-pay

## 2020-02-28 ENCOUNTER — Ambulatory Visit: Payer: Medicare PPO | Admitting: Cardiovascular Disease

## 2020-02-28 VITALS — BP 132/62 | HR 70 | Ht 66.0 in | Wt 141.0 lb

## 2020-02-28 DIAGNOSIS — I6522 Occlusion and stenosis of left carotid artery: Secondary | ICD-10-CM | POA: Diagnosis not present

## 2020-02-28 DIAGNOSIS — I639 Cerebral infarction, unspecified: Secondary | ICD-10-CM | POA: Diagnosis not present

## 2020-02-28 DIAGNOSIS — I10 Essential (primary) hypertension: Secondary | ICD-10-CM

## 2020-02-28 DIAGNOSIS — I739 Peripheral vascular disease, unspecified: Secondary | ICD-10-CM | POA: Diagnosis not present

## 2020-02-28 DIAGNOSIS — E782 Mixed hyperlipidemia: Secondary | ICD-10-CM

## 2020-02-28 DIAGNOSIS — I872 Venous insufficiency (chronic) (peripheral): Secondary | ICD-10-CM

## 2020-02-28 MED ORDER — HYDROCHLOROTHIAZIDE 12.5 MG PO CAPS
ORAL_CAPSULE | ORAL | 0 refills | Status: DC
Start: 2020-02-28 — End: 2024-06-30

## 2020-02-28 MED ORDER — TELMISARTAN 80 MG PO TABS: 80.0000 mg | ORAL_TABLET | Freq: Every day | ORAL | 3 refills | Status: DC

## 2020-02-28 NOTE — Patient Instructions (Addendum)
Medication Instructions:  We will change the telmisartan and HCTZ to separate pills   1) Start telmisartan 80 mg - take 1 tablet by mouth once daily  2) Start hctz 12.5 mg- take 1 tablet by mouth once daily as needed for increased swelling  3) On days with the HCTZ, consider adding a little over the counter/ extra dietary  potassium  If you need a refill on your cardiac medications before your next appointment, please call your pharmacy.    Lab work: No new labs needed   If you have labs (blood work) drawn today and your tests are completely normal, you will receive your results only by: Marland Kitchen MyChart Message (if you have MyChart) OR . A paper copy in the mail If you have any lab test that is abnormal or we need to change your treatment, we will call you to review the results.   Testing/Procedures: No new testing needed   Follow-Up: At [facility], you and your health needs are our priority.  As part of our continuing mission to provide you with exceptional heart care, we have created designated Provider Care Teams.  These Care Teams include your primary Cardiologist (physician) and Advanced Practice Providers (APPs -  Physician Assistants and Nurse Practitioners) who all work together to provide you with the care you need, when you need it.  . You will need a follow up appointment in 12 months  . Providers on your designated Care Team:   . Murray Hodgkins, NP . Christell Faith, PA-C . Marrianne Mood, PA-C  Any Other Special Instructions Will Be Listed Below (If Applicable).  COVID-19 Vaccine Information can be found at: [email or web address].uk For questions related to vaccine distribution or appointments, please email [email or web address] or call [phone].

## 2020-03-12 ENCOUNTER — Ambulatory Visit: Payer: Self-pay

## 2020-03-22 DIAGNOSIS — H903 Sensorineural hearing loss, bilateral: Secondary | ICD-10-CM | POA: Insufficient documentation

## 2020-04-05 DIAGNOSIS — G3184 Mild cognitive impairment, so stated: Secondary | ICD-10-CM | POA: Insufficient documentation

## 2020-04-14 ENCOUNTER — Other Ambulatory Visit: Payer: Self-pay | Admitting: Critical Care Medicine

## 2020-04-14 DIAGNOSIS — C220 Liver cell carcinoma: Secondary | ICD-10-CM

## 2020-04-14 DIAGNOSIS — I639 Cerebral infarction, unspecified: Secondary | ICD-10-CM

## 2020-04-14 DIAGNOSIS — U071 COVID-19: Secondary | ICD-10-CM

## 2020-04-14 DIAGNOSIS — I10 Essential (primary) hypertension: Secondary | ICD-10-CM

## 2020-04-14 DIAGNOSIS — I6522 Occlusion and stenosis of left carotid artery: Secondary | ICD-10-CM

## 2020-04-14 NOTE — Progress Notes (Signed)
I connected by phone with Jill Parrish on 04/14/2020 at 3:19 PM to discuss the potential use of a new treatment for mild to moderate COVID-19 viral infection in non-hospitalized patients.  This patient is a 79 y.o. female that meets the FDA criteria for Emergency Use Authorization of COVID monoclonal antibody casirivimab/imdevimab.  Has a (+) direct SARS-CoV-2 viral test result  Has mild or moderate COVID-19   Is NOT hospitalized due to COVID-19  Is within 10 days of symptom onset  Has at least one of the high risk factor(s) for progression to severe COVID-19 and/or hospitalization as defined in EUA.  Specific high risk criteria : Older age (>/= 80 yo), Immunosuppressive Disease or Treatment and Cardiovascular disease or hypertension   I have spoken and communicated the following to the patient or parent/caregiver regarding COVID monoclonal antibody treatment:  1. FDA has authorized the emergency use for the treatment of mild to moderate COVID-19 in adults and pediatric patients with positive results of direct SARS-CoV-2 viral testing who are 22 years of age and older weighing at least 40 kg, and who are at high risk for progressing to severe COVID-19 and/or hospitalization.  2. The significant known and potential risks and benefits of COVID monoclonal antibody, and the extent to which such potential risks and benefits are unknown.  3. Information on available alternative treatments and the risks and benefits of those alternatives, including clinical trials.  4. Patients treated with COVID monoclonal antibody should continue to self-isolate and use infection control measures (e.g., wear mask, isolate, social distance, avoid sharing personal items, clean and disinfect high touch surfaces, and frequent handwashing) according to CDC guidelines.   5. The patient or parent/caregiver has the option to accept or refuse COVID monoclonal antibody treatment.  After reviewing this information  with the patient, The patient agreed to proceed with receiving casirivimab\imdevimab infusion and will be provided a copy of the Fact sheet prior to receiving the infusion. Asencion Noble 04/14/2020 3:19 PM

## 2020-04-15 ENCOUNTER — Ambulatory Visit (HOSPITAL_COMMUNITY)
Admission: RE | Admit: 2020-04-15 | Discharge: 2020-04-15 | Disposition: A | Discharge status: Home / Self Care | Payer: Medicare Other | Source: Ambulatory Visit | Attending: Pulmonary Disease | Admitting: Pulmonary Disease

## 2020-04-15 ENCOUNTER — Telehealth: Payer: Self-pay | Admitting: Critical Care Medicine

## 2020-04-15 DIAGNOSIS — U071 COVID-19: Secondary | ICD-10-CM

## 2020-04-15 DIAGNOSIS — I639 Cerebral infarction, unspecified: Secondary | ICD-10-CM

## 2020-04-15 DIAGNOSIS — I6522 Occlusion and stenosis of left carotid artery: Secondary | ICD-10-CM

## 2020-04-15 DIAGNOSIS — Z23 Encounter for immunization: Secondary | ICD-10-CM | POA: Insufficient documentation

## 2020-04-15 DIAGNOSIS — I10 Essential (primary) hypertension: Secondary | ICD-10-CM

## 2020-04-15 DIAGNOSIS — C220 Liver cell carcinoma: Secondary | ICD-10-CM

## 2020-04-15 MED ORDER — DIPHENHYDRAMINE HCL 50 MG/ML IJ SOLN: 50.0000 mg | Freq: Once | INTRAMUSCULAR | Status: DC | PRN

## 2020-04-15 MED ORDER — SODIUM CHLORIDE 0.9 % IV SOLN: INTRAVENOUS | Status: DC | PRN

## 2020-04-15 MED ORDER — METHYLPREDNISOLONE SODIUM SUCC 125 MG IJ SOLR: 125.0000 mg | Freq: Once | INTRAMUSCULAR | Status: DC | PRN

## 2020-04-15 MED ORDER — ALBUTEROL SULFATE HFA 108 (90 BASE) MCG/ACT IN AERS: 2.0000 | INHALATION_SPRAY | Freq: Once | RESPIRATORY_TRACT | Status: DC | PRN

## 2020-04-15 MED ORDER — FAMOTIDINE IN NACL 20-0.9 MG/50ML-% IV SOLN: 20.0000 mg | Freq: Once | INTRAVENOUS | Status: DC | PRN

## 2020-04-15 MED ORDER — EPINEPHRINE 0.3 MG/0.3ML IJ SOAJ: 0.3000 mg | Freq: Once | INTRAMUSCULAR | Status: DC | PRN

## 2020-04-15 MED ORDER — SODIUM CHLORIDE 0.9 % IV SOLN
1200.0000 mg | Freq: Once | INTRAVENOUS | Status: AC
  Administered 2020-04-15: 1200 mg via INTRAVENOUS
  Filled 2020-04-15: qty 10

## 2020-04-15 NOTE — Progress Notes (Signed)
  Diagnosis: COVID-19  Physician: Dr. Tia Masker  Procedure: [facility] Med: casirivimab\imdevimab infusion - Provided patient with casirivimab\imdevimab fact sheet for patients, parents and caregivers prior to infusion.  Complications: No immediate complications noted.  Discharge: Discharged home   Katlin Ciszewski, Cathlyn Parsons 04/15/2020

## 2020-04-15 NOTE — Discharge Instructions (Signed)

## 2020-04-15 NOTE — Telephone Encounter (Signed)
I spoke to the patients daughter   The patient is stable but did fall last night after taking Nyquil  Her [facility] test is still pending .   I spoke to [employer] and they are backed up with now 48hr turnaround.  I have instructed the infusion center to proceed with infusion due to this patients multiple risk factors , partial vaccination , and symptom complex c/w Covid infection  Hopefully lab result will return by this afternoon before the 230p infusion

## 2020-10-03 ENCOUNTER — Other Ambulatory Visit: Payer: Self-pay

## 2020-10-03 ENCOUNTER — Ambulatory Visit: Payer: Medicare Other | Admitting: Gastroenterology

## 2020-10-03 VITALS — BP 144/69 | HR 80 | Temp 97.8°F | Ht 66.0 in | Wt 141.0 lb

## 2020-10-03 DIAGNOSIS — F419 Anxiety disorder, unspecified: Secondary | ICD-10-CM | POA: Insufficient documentation

## 2020-10-03 DIAGNOSIS — C229 Malignant neoplasm of liver, not specified as primary or secondary: Secondary | ICD-10-CM | POA: Diagnosis not present

## 2020-10-03 DIAGNOSIS — R1013 Epigastric pain: Secondary | ICD-10-CM

## 2020-10-03 DIAGNOSIS — R109 Unspecified abdominal pain: Secondary | ICD-10-CM

## 2020-10-03 DIAGNOSIS — J020 Streptococcal pharyngitis: Secondary | ICD-10-CM | POA: Insufficient documentation

## 2020-10-03 NOTE — Patient Instructions (Signed)
Lactose-Free Diet, Adult If you have lactose intolerance, you are not able to digest lactose. Lactose is a natural sugar found mainly in dairy milk and dairy products. You may need to avoid all foods and beverages that contain lactose. A lactose-free diet can help you do this. Which foods have lactose? Lactose is found in dairy milk and dairy products, such as:  Yogurt.  Cheese.  Butter.  Margarine.  Sour cream.  Cream.  Whipped toppings and nondairy creamers.  Ice cream and other dairy-based desserts. Lactose is also found in foods or products made with dairy milk or milk ingredients. To find out whether a food contains dairy milk or a milk ingredient, look at the ingredients list. Avoid foods with the statement "May contain milk" and foods that contain:  Milk powder.  Whey.  Curd.  Caseinate.  Lactose.  Lactalbumin.  Lactoglobulin. What are alternatives to dairy milk and foods made with milk products?  Lactose-free milk.  Soy milk with added calcium and vitamin D.  Almond milk, coconut milk, rice milk, or other nondairy milk alternatives with added calcium and vitamin D. Note that these are low in protein.  Soy products, such as soy yogurt, soy cheese, soy ice cream, and soy-based sour cream.  Other nut milk products, such as almond yogurt, almond cheese, cashew yogurt, cashew cheese, cashew ice cream, coconut yogurt, and coconut ice cream. What are tips for following this plan?  Do not consume foods, beverages, vitamins, minerals, or medicines containing lactose. Read ingredient lists carefully.  Look for the words "lactose-free" on labels.  Use lactase enzyme drops or tablets as directed by your health care provider.  Use lactose-free milk or a milk alternative, such as soy milk or almond milk, for drinking and cooking.  Make sure you get enough calcium and vitamin D in your diet. A lactose-free eating plan can be lacking in these important  nutrients.  Take calcium and vitamin D supplements as directed by your health care provider. Talk to your health care provider about supplements if you are not able to get enough calcium and vitamin D from food. What foods can I eat? Fruits All fresh, canned, frozen, or dried fruits that are not processed with lactose. Vegetables All fresh, frozen, and canned vegetables without cheese, cream, or butter sauces. Grains Any that are not made with dairy milk or dairy products. Meats and other proteins Any meat, fish, poultry, and other protein sources that are not made with dairy milk or dairy products. Soy cheese and yogurt. Fats and oils Any that are not made with dairy milk or dairy products. Beverages Lactose-free milk. Soy, rice, or almond milk with added calcium and vitamin D. Fruit and vegetable juices. Sweets and desserts Any that are not made with dairy milk or dairy products. Seasonings and condiments Any that are not made with dairy milk or dairy products. Calcium Calcium is found in many foods that contain lactose and is important for bone health. The amount of calcium you need depends on your age:  Adults younger than 50 years: 1,000 mg of calcium a day.  Adults older than 50 years: 1,200 mg of calcium a day. If you are not getting enough calcium, you may get it from other sources, including:  Orange juice with calcium added. There are 300-350 mg of calcium in 1 cup of orange juice.  Calcium-fortified soy milk. There are 300-400 mg of calcium in 1 cup of calcium-fortified soy milk.  Calcium-fortified rice or almond milk. There  are 300 mg of calcium in 1 cup of calcium-fortified rice or almond milk.  Calcium-fortified breakfast cereals. There are 100-1,000 mg of calcium in calcium-fortified breakfast cereals.  Spinach, cooked. There are 145 mg of calcium in  cup of cooked spinach.  Edamame, cooked. There are 130 mg of calcium in  cup of cooked edamame.  Collard  greens, cooked. There are 125 mg of calcium in  cup of cooked collard greens.  Kale, frozen or cooked. There are 90 mg of calcium in  cup of cooked or frozen kale.  Almonds. There are 95 mg of calcium in  cup of almonds.  Broccoli, cooked. There are 60 mg of calcium in 1 cup of cooked broccoli. The items listed above may not be a complete list of recommended foods and beverages. Contact a dietitian for more options.   What foods are not recommended? Fruits None, unless they are made with dairy milk or dairy products. Vegetables None, unless they are made with dairy milk or dairy products. Grains Any grains that are made with dairy milk or dairy products. Meats and other proteins None, unless they are made with dairy milk or dairy products. Dairy All dairy products, including milk, goat's milk, buttermilk, kefir, acidophilus milk, flavored milk, evaporated milk, condensed milk, dulce de [place], eggnog, yogurt, cheese, and cheese spreads. Fats and oils Any that are made with milk or milk products. Margarines and salad dressings that contain milk or cheese. Cream. Half and half. Cream cheese. Sour cream. Chip dips made with sour cream or yogurt. Beverages Hot chocolate. Cocoa with lactose. Instant iced teas. Powdered fruit drinks. Smoothies made with dairy milk or yogurt. Sweets and desserts Any that are made with milk or milk products. Seasonings and condiments Chewing gum that has lactose. Spice blends if they contain lactose. Artificial sweeteners that contain lactose. Nondairy creamers. The items listed above may not be a complete list of foods and beverages to avoid. Contact a dietitian for more information. Summary  If you are lactose intolerant, it means that you have a hard time digesting lactose, a natural sugar found in milk and milk products.  Following a lactose-free diet can help you manage this condition.  Calcium is important for bone health and is found in many foods  that contain lactose. Talk with your health care provider about other sources of calcium. This information is not intended to replace advice given to you by your health care provider. Make sure you discuss any questions you have with your health care provider. Document Revised: 08/18/2017 Document Reviewed: 08/18/2017 Elsevier Patient Education  2021 [employer].

## 2020-10-04 NOTE — Progress Notes (Signed)
Jill Parrish [address]  [place]  [place], [place] [address]  Main: [phone]  Fax: [phone]   Gastroenterology Consultation  Referring Provider:     Dionicia Abler, PA-C Primary Care Physician:  Derinda Late, MD Reason for Consultation:     Abdominal pain        HPI:    Chief Complaint  Patient presents with  . Abdominal Pain    Jill Parrish is a 80 y.o. y/o female referred for consultation & management  by Dr. Derinda Late, MD.  Patient presents with her daughter and reports abdominal discomfort and bloating after meals.  They have tried altering her diet with partial results.  They have avoided all lactose as patient has been told she is lactose intolerant and does not drink milk anymore.  However, is able to tolerate ice cream without difficulty.  No weight loss, nausea or vomiting.  No blood in stool.  Describes the pain to improve with Pepto-Bismol.  Takes Pepto-Bismol once a day.  However, the location of the pain is bilateral lower quadrant.  Does consume a high-fiber diet and reports 1-2 loose bowel movement daily or every other day.  Has had previous history of cholecystectomy and since then bowel movements have been loose but usually are not more than 2 a day.  Has history of hepatocellular carcinoma and underwent robotic partial hepatectomy in 2019.  Follows with oncology with most recent MRI with no evidence of recurrence  They also report the patient has had previous history of diverticulitis and hospitalization at that time.  Since then she has had a colonoscopy, last colonoscopy being in August 2020 with 8 mm cecal polyp removed via cold snare.  Diverticulosis and nonbleeding internal hemorrhoids noted.  Recommendations on that colonoscopy report were "no further screening colonoscopies recommended given age and current guidelines."  Past Medical History:  Diagnosis Date  . Anxiety disorder   . Diabetes mellitus without complication  ([place]) 08/09/2692  . Dyslipidemia   . GERD (gastroesophageal reflux disease)   . Hepatocellular carcinoma ([place]) 04/07/2018  . Hyperlipidemia   . Hypertension   . Migraine headache    with neurologic features  . Migraine headache with aura   . Pneumonia   . 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 TIA (transient ischemic attack) 12/18/12    Past Surgical History:  Procedure Laterality Date  . ABDOMINAL HYSTERECTOMY    . CATARACT EXTRACTION Bilateral   . KNEE SURGERY Right 2005  . NASAL SINUS SURGERY    . OVARIAN CYST REMOVAL    . PARTIAL HYSTERECTOMY  1985  . ROTATOR CUFF REPAIR Right 2007   arthroscopy  . TONSILLECTOMY  1947    Prior to Admission medications   Medication Sig Start Date End Date Taking? Authorizing Provider  acetaminophen (TYLENOL) 500 MG tablet Take 500 mg by mouth every 6 (six) hours as needed for mild pain or headache.   Yes [provider]  amLODipine (NORVASC) 5 MG tablet Take 5 mg by mouth daily.   Yes [provider]  ascorbic acid (VITAMIN C) 500 MG tablet Take 1 tablet by mouth daily.   Yes [provider]  aspirin EC 81 MG tablet Take 81 mg by mouth daily.   Yes [provider]  Azelastine-Fluticasone 137-50 MCG/ACT SUSP 137 mcg by Each Nare route once daily. 03/10/14  Yes [provider]  Cholecalciferol 125 MCG (5000 UT) TABS Take by mouth.   Yes [provider]  diclofenac sodium (VOLTAREN) 1 % GEL Apply 2 g topically as needed (for pain).   Yes [provider]  ezetimibe (ZETIA) 10 MG tablet  11/30/17  Yes [provider]  fluticasone (FLONASE) 50 MCG/ACT nasal spray Place 2 sprays into the nose daily. 10/31/19 10/30/20 Yes [provider]  hydrochlorothiazide (MICROZIDE) 12.5 MG capsule Take 1 capsule (12.5 mg) by mouth once daily as needed for increased swelling 02/28/20  Yes Gollan, Kathlene November, MD  Misc Natural Products (HYPROST) CAPS Take 2 tablets by mouth in the morning and  at bedtime.   Yes [provider]  Omega-3 1000 MG CAPS Take 1 capsule by mouth daily.   Yes [provider]  SUMAtriptan (IMITREX) 100 MG tablet Take by mouth.   Yes [provider]  telmisartan (MICARDIS) 80 MG tablet Take 1 tablet (80 mg total) by mouth daily. 02/28/20  Yes Minna Merritts, MD  Zinc 10 MG LOZG Take by mouth.   Yes [provider]  EPINEPHrine 0.3 mg/0.3 mL IJ SOAJ injection Inject into the muscle. Patient not taking: Reported on 10/03/2020 11/18/13   [provider]    Family History  Problem Relation Age of Onset  . Stroke Mother   . Heart attack Father   . Breast cancer Sister 67  . Aneurysm Sister        Abdominal aortic aneurysm  . Stroke Maternal Aunt   . Breast cancer Maternal Aunt 80  . Stroke Maternal Grandfather   . Heart disease Paternal Grandmother   . Breast cancer Paternal Grandmother 6  . Heart disease Paternal Grandfather      Social History   Tobacco Use  . Smoking status: Never Smoker  . Smokeless tobacco: Never Used  Substance Use Topics  . Alcohol use: Yes    Comment: glass of red wine ocassionally  . Drug use: No    Allergies as of 10/03/2020 - Review Complete 10/03/2020  Allergen Reaction Noted  . Erythromycin base Shortness Of Breath 11/26/2012  . Minocin [minocycline hcl] Itching and Rash 01/28/2011  . Aspirin  09/22/2014  . Atorvastatin  03/20/2014  . Cefprozil  03/20/2014  . Erythromycin  09/22/2014  . Fexofenadine  03/20/2014  . Loratadine  03/20/2014  . Minocycline  03/20/2014  . Sulfa antibiotics Itching and Swelling 12/17/2012  . Ciprofloxacin Rash 01/18/2015  . Keflex [cephalexin] Itching 01/28/2011    Review of Systems:    All systems reviewed and negative except where noted in HPI.   Physical Exam:  BP (!) 144/69   Pulse 80   Temp 97.8 F (36.6 C) (Oral)   Ht 5\' 6"  (1.676 m)   Wt 141 lb (64 kg)   BMI 22.76 kg/m  No LMP recorded. Patient has had a  hysterectomy. Psych:  Alert and cooperative. Normal mood and affect. General:   Alert,  Well-developed, well-nourished, pleasant and cooperative in NAD Head:  Normocephalic and atraumatic. Eyes:  Sclera clear, no icterus.   Conjunctiva pink. Ears:  Normal auditory acuity. Nose:  No deformity, discharge, or lesions. Mouth:  No deformity or lesions,oropharynx pink & moist. Neck:  Supple; no masses or thyromegaly. Abdomen:  Normal bowel sounds.  No bruits.  Soft, non-tender and non-distended without masses, hepatosplenomegaly or hernias noted.  No guarding or rebound tenderness.    Msk:  Symmetrical without gross deformities. Good, equal movement & strength bilaterally. Pulses:  Normal pulses noted. Extremities:  No clubbing or edema.  No cyanosis. Neurologic:  Alert and oriented x3;  grossly normal neurologically. Skin:  Intact without significant lesions or rashes. No jaundice. Lymph Nodes:  No significant cervical adenopathy. Psych:  Alert and cooperative. Normal mood and affect.   Labs: CBC    Component Value Date/Time   WBC 9.9 12/28/2014 0425   RBC 3.85 12/28/2014 0425   HGB 11.9 (L) 12/28/2014 0425   HGB 14.4 07/22/2013 1802   HCT 34.6 (L) 12/28/2014 0425   HCT 43.0 07/22/2013 1802   PLT 235 12/28/2014 0425   PLT 229 07/22/2013 1802   MCV 89.9 12/28/2014 0425   MCV 84 07/22/2013 1802   MCH 31.0 12/28/2014 0425   MCHC 34.4 12/28/2014 0425   RDW 13.1 12/28/2014 0425   RDW 13.9 07/22/2013 1802   LYMPHSABS 1.0 12/26/2014 1630   MONOABS 1.1 (H) 12/26/2014 1630   EOSABS 0.0 12/26/2014 1630   BASOSABS 0.0 12/26/2014 1630   CMP     Component Value Date/Time   NA 139 12/28/2014 0425   NA 131 (L) 07/22/2013 1802   K 3.5 12/28/2014 0425   K 4.5 07/22/2013 1802   CL 107 12/28/2014 0425   CL 99 07/22/2013 1802   CO2 22 12/28/2014 0425   CO2 28 07/22/2013 1802   GLUCOSE 110 (H) 12/28/2014 0425   GLUCOSE 120 (H) 07/22/2013 1802   BUN 10 12/28/2014 0425   BUN 32 (H)  07/22/2013 1802   CREATININE 0.66 12/28/2014 0425   CREATININE 0.97 07/22/2013 1802   CALCIUM 7.6 (L) 12/28/2014 0425   CALCIUM 8.4 (L) 07/22/2013 1802   PROT 6.7 12/26/2014 1630   PROT 6.7 07/22/2013 1802   ALBUMIN 3.6 12/26/2014 1630   ALBUMIN 3.2 (L) 07/22/2013 1802   AST 22 12/26/2014 1630   AST 67 (H) 07/22/2013 1802   ALT 21 12/26/2014 1630   ALT 42 07/22/2013 1802   ALKPHOS 83 12/26/2014 1630   ALKPHOS 74 07/22/2013 1802   BILITOT 1.0 12/26/2014 1630   BILITOT 0.5 07/22/2013 1802   GFRNONAA >60 12/28/2014 0425   GFRNONAA 58 (L) 07/22/2013 1802   GFRAA >60 12/28/2014 0425   GFRAA >60 07/22/2013 1802    Imaging Studies: No results found.  Assessment and Plan:   Jill Parrish is a 80 y.o. y/o female has been referred for abdominal discomfort and bloating  I will start with H. pylori breath test given that patient symptoms do get better with Pepto-Bismol If H. pylori breath test positive, treat as appropriate If negative, trial of PPI  If symptoms continue, consider imaging of the abdomen to rule out any underlying abnormalities given that symptoms are mostly bilateral lower quadrant  No alarm symptoms present at this time  Patient does not have any diarrhea with gluten-containing products, but will obtain celiac panel given abdominal bloating present with certain foods, but no clear trigger foods  Dr Jill Parrish  Speech recognition software was used to dictate the above note.

## 2020-10-06 LAB — H. PYLORI BREATH TEST: H pylori Breath Test: NEGATIVE

## 2020-10-06 LAB — CELIAC DISEASE PANEL
Endomysial IgA: NEGATIVE
IgA/Immunoglobulin A, Serum: 132 mg/dL (ref 64–422)
Transglutaminase IgA: <2 U/mL (ref 0–3)

## 2020-10-10 MED ORDER — OMEPRAZOLE 40 MG PO CPDR: 40.0000 mg | DELAYED_RELEASE_CAPSULE | Freq: Every day | ORAL | 0 refills | Status: DC

## 2020-10-10 NOTE — Addendum Note (Signed)
Addended by: Vonda Antigua on: 10/10/2020 09:43 AM   Modules accepted: Orders

## 2020-10-16 ENCOUNTER — Telehealth: Payer: Self-pay | Admitting: Gastroenterology

## 2020-10-16 NOTE — Telephone Encounter (Signed)
Patient's daughter called to ask about results from her Mother's tests.  Please call her at [phone]

## 2020-10-18 ENCOUNTER — Other Ambulatory Visit: Payer: Self-pay | Admitting: Gastroenterology

## 2020-10-18 ENCOUNTER — Telehealth: Payer: Self-pay | Admitting: Gastroenterology

## 2020-10-18 NOTE — Telephone Encounter (Signed)
Called Leslie back but I was not able to speak with her. However, I was able to leave a detailed message letting her know that Dr. Donavan Burnet stated that her mother's labs were normal but still ordered her to take Omeprazole daily and if her symptoms of abdominal pain didn't get better, to please give [place] a call in 2-3 weeks to let us know.

## 2020-10-25 NOTE — Telephone Encounter (Signed)
Error

## 2021-01-09 ENCOUNTER — Ambulatory Visit: Payer: Medicare PPO | Admitting: Gastroenterology

## 2021-03-06 ENCOUNTER — Other Ambulatory Visit: Payer: Self-pay

## 2021-03-06 ENCOUNTER — Ambulatory Visit: Payer: Medicare PPO | Admitting: Gastroenterology

## 2021-03-06 VITALS — BP 133/67 | HR 81 | Temp 98.5°F | Ht 66.0 in | Wt 153.6 lb

## 2021-03-06 DIAGNOSIS — R109 Unspecified abdominal pain: Secondary | ICD-10-CM

## 2021-03-06 DIAGNOSIS — K5900 Constipation, unspecified: Secondary | ICD-10-CM | POA: Diagnosis not present

## 2021-03-06 MED ORDER — OMEPRAZOLE 20 MG PO CPDR: 20.0000 mg | DELAYED_RELEASE_CAPSULE | Freq: Every day | ORAL | 1 refills | Status: DC

## 2021-03-06 MED ORDER — POLYETHYLENE GLYCOL 3350 17 GM/SCOOP PO POWD: 17.0000 g | Freq: Every day | ORAL | 1 refills | Status: AC

## 2021-03-07 NOTE — Progress Notes (Signed)
Vonda Antigua, MD [address]  [place]  [place], [place] [address]  Main: [phone]  Fax: [phone]   Primary Care Physician: Derinda Late, MD   Chief complaint: Abdominal pain  HPI: NICOLAS EISENHAUER is a 80 y.o. female here for follow-up of abdominal pain.  Presents with her daughter who helps provide history.  Patient reports improvement in symptoms from before.  States she is doing much better.  Denies any nausea or vomiting.  Reports good appetite.  Is on omeprazole and states is making a difference.  Does report intermittent constipation.  Has not taking MiraLAX consistently for it and only as needed.  States that she is taking Pepto-Bismol less frequently.  Daughter thinks that she is taking it more habitually and not so much for symptoms.  Daughter feels that her symptoms have improved after eating a gluten-free diet.  Celiac disease panel was negative.  Previous history:  Patient presents with her daughter and reports abdominal discomfort and bloating after meals.  They have tried altering her diet with partial results.  They have avoided all lactose as patient has been told she is lactose intolerant and does not drink milk anymore.  However, is able to tolerate ice cream without difficulty.  No weight loss, nausea or vomiting.  No blood in stool.  Describes the pain to improve with Pepto-Bismol.  Takes Pepto-Bismol once a day.  However, the location of the pain is bilateral lower quadrant.  Does consume a high-fiber diet and reports 1-2 loose bowel movement daily or every other day.  Has had previous history of cholecystectomy and since then bowel movements have been loose but usually are not more than 2 a day.  Has history of hepatocellular carcinoma and underwent robotic partial hepatectomy in 2019.  Follows with oncology with most recent MRI with no evidence of recurrence  They also report the patient has had previous history of diverticulitis and  hospitalization at that time.  Since then she has had a colonoscopy, last colonoscopy being in August 2020 with 8 mm cecal polyp removed via cold snare.  Diverticulosis and nonbleeding internal hemorrhoids noted.  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 Authorizing Provider  acetaminophen (TYLENOL) 500 MG tablet Take 500 mg by mouth every 6 (six) hours as needed for mild pain or headache.   Yes [provider]  amLODipine (NORVASC) 5 MG tablet Take 5 mg by mouth daily.  Yes [provider]  ascorbic acid (VITAMIN C) 500 MG tablet Take 1 tablet by mouth daily.   Yes [provider]  aspirin EC 81 MG tablet Take 81 mg by mouth daily.   Yes [provider]  Azelastine-Fluticasone 137-50 MCG/ACT SUSP 137 mcg by Each Nare route once daily. 03/10/14  Yes [provider]  Cholecalciferol 125 MCG (5000 UT) TABS Take by mouth.   Yes [provider]  diclofenac sodium (VOLTAREN) 1 % GEL Apply 2 g topically as  needed (for pain).   Yes [provider]  EPINEPHrine 0.3 mg/0.3 mL IJ SOAJ injection Inject into the muscle. 11/18/13  Yes [provider]  ezetimibe (ZETIA) 10 MG tablet  11/30/17  Yes [provider]  hydrochlorothiazide (MICROZIDE) 12.5 MG capsule Take 1 capsule (12.5 mg) by mouth once daily as needed for increased swelling 02/28/20  Yes Gollan, Kathlene November, MD  Misc Natural Products (HYPROST) CAPS Take 2 tablets by mouth in the morning and at bedtime.   Yes [provider]  Omega-3 1000 MG CAPS Take 1 capsule by mouth daily.   Yes [provider]  omeprazole (PRILOSEC) 20 MG capsule Take 1 capsule (20 mg total) by mouth daily. 03/06/21  Yes Vonda Antigua B, MD  polyethylene glycol powder (GLYCOLAX/MIRALAX) 17 GM/SCOOP powder Take 17 g by mouth daily. 03/06/21  Yes Virgel Manifold, MD  SUMAtriptan (IMITREX) 100 MG tablet Take by mouth.   Yes [provider]  telmisartan (MICARDIS) 80 MG tablet Take 1 tablet (80 mg total) by mouth daily. 02/28/20  Yes Minna Merritts, MD  Zinc 10 MG LOZG Take by mouth.   Yes [provider]  fluticasone (FLONASE) 50 MCG/ACT nasal spray Place 2 sprays into the nose daily. 10/31/19 10/30/20  [provider]    Family History  Problem Relation Age of Onset   Stroke Mother    Heart attack Father    Breast cancer Sister 79   Aneurysm Sister        Abdominal aortic aneurysm   Stroke Maternal Aunt    Breast cancer Maternal Aunt 80   Stroke Maternal Grandfather    Heart disease Paternal Grandmother    Breast cancer Paternal Grandmother 40   Heart disease Paternal Grandfather      Social History   Tobacco Use   Smoking status: Never   Smokeless tobacco: Never  Substance Use Topics   Alcohol use: Yes    Comment: glass of red wine ocassionally   Drug use: No    Allergies as of 03/06/2021 - Review Complete 03/06/2021  Allergen Reaction Noted   Erythromycin base Shortness Of  Breath 11/26/2012   Minocin [minocycline hcl] Itching and Rash 01/28/2011   Aspirin  09/22/2014   Atorvastatin  03/20/2014   Cefprozil  03/20/2014   Erythromycin  09/22/2014   Fexofenadine  03/20/2014   Loratadine  03/20/2014   Minocycline  03/20/2014   Sulfa antibiotics Itching and Swelling 12/17/2012   Ciprofloxacin Rash 01/18/2015   Keflex [cephalexin] Itching 01/28/2011    Physical Examination:  Constitutional: General:   Alert,  Well-developed, well-nourished, pleasant and cooperative in NAD BP 133/67   Pulse 81   Temp 98.5 F (36.9 C) (Oral)   Ht '5\' 6"'$  (1.676 m)   Wt 153 lb 9.6 oz (69.7 kg)   BMI 24.79 kg/m   Respiratory: Normal respiratory effort  Gastrointestinal:  Soft, non-tender and non-distended without masses, hepatosplenomegaly or hernias noted.  No guarding or  rebound tenderness.     Cardiac: No clubbing or edema.  No cyanosis. Normal posterior tibial pedal pulses noted.  Psych:  Alert and cooperative. Normal mood and affect.  Musculoskeletal:  Normal gait. Head normocephalic, atraumatic. Symmetrical without gross deformities. 5/5 Lower extremity strength bilaterally.  Skin: Warm. Intact without significant lesions or rashes. No jaundice.  Neck: Supple, trachea midline  Lymph: No cervical lymphadenopathy  Psych:  Alert and oriented x3, Alert and cooperative. Normal mood and affect.  Labs: CMP     Component Value Date/Time   NA 139 12/28/2014 0425   NA 131 (L) 07/22/2013 1802   K 3.5 12/28/2014 0425   K 4.5 07/22/2013 1802   CL 107 12/28/2014 0425   CL 99 07/22/2013 1802   CO2 22 12/28/2014 0425   CO2 28 07/22/2013 1802   GLUCOSE 110 (H) 12/28/2014 0425   GLUCOSE 120 (H) 07/22/2013 1802   BUN 10 12/28/2014 0425   BUN 32 (H) 07/22/2013 1802   CREATININE 0.66 12/28/2014 0425   CREATININE 0.97 07/22/2013 1802   CALCIUM 7.6 (L) 12/28/2014 0425   CALCIUM 8.4 (L) 07/22/2013 1802   PROT 6.7 12/26/2014 1630   PROT 6.7 07/22/2013 1802   ALBUMIN  3.6 12/26/2014 1630   ALBUMIN 3.2 (L) 07/22/2013 1802   AST 22 12/26/2014 1630   AST 67 (H) 07/22/2013 1802   ALT 21 12/26/2014 1630   ALT 42 07/22/2013 1802   ALKPHOS 83 12/26/2014 1630   ALKPHOS 74 07/22/2013 1802   BILITOT 1.0 12/26/2014 1630   BILITOT 0.5 07/22/2013 1802   GFRNONAA >60 12/28/2014 0425   GFRNONAA 58 (L) 07/22/2013 1802   GFRAA >60 12/28/2014 0425   GFRAA >60 07/22/2013 1802   Lab Results  Component Value Date   WBC 9.9 12/28/2014   HGB 11.9 (L) 12/28/2014   HCT 34.6 (L) 12/28/2014   MCV 89.9 12/28/2014   PLT 235 12/28/2014    Imaging Studies:   Assessment and Plan:   TINLEY CHREST is a 80 y.o. y/o female here for follow-up of abdominal pain  Symptoms have significantly improved with omeprazole  Patient and family willing to decrease dose, therefore refill be sent for omeprazole 20 mg once daily and not 40.  However, they do not think that they are ready to discontinue it yet given significant improvement in symptoms with it  (Risks of PPI use were discussed with patient including bone loss, C. Diff diarrhea, pneumonia, infections, CKD, electrolyte abnormalities.  Pt. Verbalizes understanding and chooses to continue the medication.)  However, symptoms remain well controlled on the lower dose, I have advised him to discontinue it after she runs out of the current prescription and they are willing to try this  I have advised him to try and discontinue Pepto-Bismol given that she seems to be taking it habitually and not really for abdominal pain symptoms  She is reporting constipation.  MiraLAX daily recommended along with a high-fiber diet.  Patient states she is not eating enough fiber  No need to continue gluten-free diet given celiac panel was negative, but daughter states that is helping her, so it is reasonable  Dr Vonda Antigua

## 2021-03-30 ENCOUNTER — Other Ambulatory Visit: Payer: Self-pay | Admitting: Cardiovascular Disease

## 2021-03-30 ENCOUNTER — Other Ambulatory Visit: Payer: Self-pay | Admitting: Gastroenterology

## 2021-05-07 ENCOUNTER — Ambulatory Visit: Payer: Medicare PPO | Admitting: Gastroenterology

## 2021-05-14 ENCOUNTER — Other Ambulatory Visit: Payer: Self-pay | Admitting: Gastroenterology

## 2021-05-14 ENCOUNTER — Other Ambulatory Visit: Payer: Self-pay | Admitting: Cardiovascular Disease

## 2021-05-16 ENCOUNTER — Telehealth: Payer: Self-pay

## 2021-05-16 ENCOUNTER — Other Ambulatory Visit: Payer: Self-pay | Admitting: Cardiovascular Disease

## 2021-05-16 NOTE — Telephone Encounter (Signed)
LMOV  

## 2021-05-16 NOTE — Telephone Encounter (Signed)
-----   Message from Charlotte Hall sent at 05/03/2021 10:17 AM EDT ----- Regarding: reschedule Reschedule

## 2021-05-16 NOTE — Telephone Encounter (Signed)
Please contact pt for future appointment. Pt is overdue for yearly f/u. Pt needing refills.

## 2021-06-07 ENCOUNTER — Other Ambulatory Visit: Payer: Self-pay | Admitting: Gastroenterology

## 2021-11-14 NOTE — Telephone Encounter (Signed)
Attempted to schedule.  LMOV to call office.  ° °

## 2021-11-26 NOTE — Telephone Encounter (Signed)
Scheduled

## 2021-11-26 NOTE — Telephone Encounter (Signed)
Attempted to schedule.  

## 2022-01-12 NOTE — Progress Notes (Unsigned)
Cardiology Office Note  Date:  01/13/2022   ID:  Jill Parrish, DOB 12-21-1940, MRN [id number]  PCP:  Derinda Late, MD   Chief Complaint  Patient presents with   12 month follow up     "Doing well." Patient c/o abdominal pain at times. Medications reviewed by the patient verbally.     HPI:  78-old woman with history of  Chest pain syndrome,  March 2004 Felt secondary to GERD,  normal Myoview treadmill stress test and echocardiogram.  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 Jacqlyn Larsen.  Stroke  May 2014  Right TIA May 2014 ,  left-sided paresthesias.  MRI brain and ultrasound carotids, echocardiogram  Hepatocellular carcinoma, Status post partial right hepatectomy  who presents for follow up of her chest pain symptoms  Last seen by myself in clinic July 2021 Presents today with her daughter In general feels she is doing well Walks dog,Lives at [facility]  No significant chest pain or shortness of breath on exertion Denies significant lower extremity edema No recent hospitalizations  Lab work reviewed Total cholesterol 140 LDL 62 A1c 6.3 Creatinine 1.1 Normal CBC  EKG personally reviewed by myself on todays visit Normal sinus rhythm rate 62 bpm PACs no significant ST-T wave changes  Other past medical history reviewed Prior history includes robotic assisted partial hepatectomy and en bloc cholecystectomy on 05/26/2018.  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   MRI scan February 14, 2020 reviewed on today's visit Status post partial right hepatectomy without recurrence No new liver lesions or abdominal metastasis Benign left adrenal adenoma  Weight down 40 pounds in 2 years 2019-2020,  No recent carotid ultrasound available, last in May 2018 Most recent  ultrasound 2020 39% disease bilaterally   PMH:   has a past medical history of Anxiety disorder, Diabetes mellitus without complication ([place]) (7/86/7672), Dyslipidemia, GERD (gastroesophageal reflux disease), Hepatocellular carcinoma ([place]) (04/07/2018), Hyperlipidemia, Hypertension, Migraine headache, Migraine headache with aura, Pneumonia, Stroke ([place]), and TIA (transient ischemic attack) (12/18/12).  PSH:    Past Surgical History:  Procedure Laterality Date   ABDOMINAL HYSTERECTOMY     CATARACT EXTRACTION Bilateral    KNEE SURGERY Right 2005   NASAL SINUS SURGERY     OVARIAN CYST REMOVAL     PARTIAL HYSTERECTOMY  1985   ROTATOR CUFF REPAIR Right 2007   arthroscopy   TONSILLECTOMY  1947    Current Outpatient Medications  Medication Sig Dispense Refill   acetaminophen (TYLENOL) 500 MG tablet Take 500 mg by mouth every 6 (six) hours as needed for mild pain or headache.     amLODipine (NORVASC) 5 MG tablet Take 5 mg by mouth daily.     ascorbic acid (VITAMIN C) 500 MG tablet Take 1 tablet by mouth daily.     aspirin EC 81 MG tablet Take 81 mg by mouth daily.     Cholecalciferol 125 MCG (5000 UT) TABS Take by mouth.     ezetimibe (ZETIA) 10 MG tablet Take 1 tablet (10 mg total) by mouth daily. 30 tablet 11   hydrochlorothiazide (MICROZIDE) 12.5 MG capsule Take 1 capsule (12.5 mg) by mouth once daily as needed for increased swelling 90 capsule 0   Misc Natural Products (HYPROST) CAPS Take 2 tablets by mouth in the morning and at bedtime.     Omega-3 1000 MG CAPS Take 1 capsule  by mouth daily.     omeprazole (PRILOSEC) 20 MG capsule TAKE 1 CAPSULE BY MOUTH ONCE DAILY 30 capsule 1   polyethylene glycol powder (GLYCOLAX/MIRALAX) 17 GM/SCOOP powder Take 17 g by mouth daily. 507 g 1   SUMAtriptan (IMITREX) 100 MG tablet Take by mouth.     telmisartan (MICARDIS) 80 MG tablet TAKE 1 TABLET BY MOUTH DAILY.  NEEDS APPT WITH CARDIOLOGY FOR FUTURE REFILLS 15 tablet 0   triamcinolone cream (KENALOG)  0.1 % Apply topically 2 (two) times daily.     Azelastine-Fluticasone 137-50 MCG/ACT SUSP 137 mcg by Each Nare route once daily. (Patient not taking: Reported on 01/13/2022)     diclofenac sodium (VOLTAREN) 1 % GEL Apply 2 g topically as needed (for pain). (Patient not taking: Reported on 01/13/2022)     EPINEPHrine 0.3 mg/0.3 mL IJ SOAJ injection Inject into the muscle. (Patient not taking: Reported on 01/13/2022)     Zinc 10 MG LOZG Take by mouth.     No current facility-administered medications for this visit.    Allergies:   Erythromycin base, Minocin [minocycline hcl], Aspirin, Atorvastatin, Cefprozil, Erythromycin, Fexofenadine, Loratadine, Minocycline, Sulfa antibiotics, Ciprofloxacin, and Keflex [cephalexin]   Social History:  The patient  reports that she has never smoked. She has never used smokeless tobacco. She reports current alcohol use. She reports that she does not use drugs.   Family History:   family history includes Aneurysm in her sister; Breast cancer (age of onset: 41) in her sister; Breast cancer (age of onset: 57) in her maternal aunt and paternal grandmother; Heart attack in her father; Heart disease in her paternal grandfather and paternal grandmother; Stroke in her maternal aunt, maternal grandfather, and mother.   Review of Systems: Review of Systems  Constitutional: Negative.   HENT: Negative.    Respiratory: Negative.    Cardiovascular: Negative.   Gastrointestinal: Negative.   Musculoskeletal: Negative.   Neurological: Negative.   Psychiatric/Behavioral: Negative.    All other systems reviewed and are negative.  PHYSICAL EXAM: VS:  BP (!) 110/58 (BP Location: Left Arm, Patient Position: Sitting, Cuff Size: Normal)   Pulse 62   Ht '5\' 7"'$  (1.702 m)   Wt 149 lb 6 oz (67.8 kg)   SpO2 98%   BMI 23.40 kg/m  , BMI Body mass index is 23.4 kg/m. Constitutional:  oriented to person, place, and time. No distress.  HENT:  Head: Grossly normal Eyes:  no discharge.  No scleral icterus.  Neck: No JVD, no carotid bruits  Cardiovascular: Regular rate and rhythm, no murmurs appreciated Pulmonary/Chest: Clear to auscultation bilaterally, no wheezes or rails Abdominal: Soft.  no distension.  no tenderness.  Musculoskeletal: Normal range of motion Neurological:  normal muscle tone. Coordination normal. No atrophy Skin: Skin warm and dry Psychiatric: normal affect, pleasant  Recent Labs: No results found for requested labs within last 365 days.    Lipid Panel Lab Results  Component Value Date   CHOL 240 (H) 12/18/2012   HDL 58 12/18/2012   LDLCALC 137 (H) 12/18/2012   TRIG 224 (H) 12/18/2012      Wt Readings from Last 3 Encounters:  01/13/22 149 lb 6 oz (67.8 kg)  03/06/21 153 lb 9.6 oz (69.7 kg)  10/03/20 141 lb (64 kg)       ASSESSMENT AND PLAN:  Chest pain, unspecified type -  Normal stress test 12/2016 Denies any recent chest pain symptoms Active Currently with no symptoms of angina. No further workup at this  time. Continue current medication regimen.  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 01/13/2022  [place], [place]

## 2022-01-13 ENCOUNTER — Encounter: Payer: Self-pay | Admitting: Cardiovascular Disease

## 2022-01-13 ENCOUNTER — Ambulatory Visit: Payer: Medicare PPO | Admitting: Cardiovascular Disease

## 2022-01-13 VITALS — BP 110/58 | HR 62 | Ht 67.0 in | Wt 149.4 lb

## 2022-01-13 DIAGNOSIS — I739 Peripheral vascular disease, unspecified: Secondary | ICD-10-CM | POA: Diagnosis not present

## 2022-01-13 DIAGNOSIS — I6522 Occlusion and stenosis of left carotid artery: Secondary | ICD-10-CM | POA: Diagnosis not present

## 2022-01-13 DIAGNOSIS — E782 Mixed hyperlipidemia: Secondary | ICD-10-CM

## 2022-01-13 DIAGNOSIS — I872 Venous insufficiency (chronic) (peripheral): Secondary | ICD-10-CM

## 2022-01-13 DIAGNOSIS — I639 Cerebral infarction, unspecified: Secondary | ICD-10-CM

## 2022-01-13 DIAGNOSIS — I10 Essential (primary) hypertension: Secondary | ICD-10-CM

## 2022-01-13 MED ORDER — EZETIMIBE 10 MG PO TABS: 10.0000 mg | ORAL_TABLET | Freq: Every day | ORAL | 11 refills | Status: AC

## 2022-01-13 NOTE — Patient Instructions (Signed)
Medication Instructions:  No changes Continue zetia/ezetimibe  If you need a refill on your cardiac medications before your next appointment, please call your pharmacy.   Lab work: No new labs needed  Testing/Procedures: No new testing needed  Follow-Up: At [facility], you and your health needs are our priority.  As part of our continuing mission to provide you with exceptional heart care, we have created designated Provider Care Teams.  These Care Teams include your primary Cardiologist (physician) and Advanced Practice Providers (APPs -  Physician Assistants and Nurse Practitioners) who all work together to provide you with the care you need, when you need it.  You will need a follow up appointment in 12 months  Providers on your designated Care Team:   Murray Hodgkins, NP Christell Faith, PA-C Cadence Kathlen Mody, [place]  COVID-19 Vaccine Information can be found at: ShippingScam.co.uk For questions related to vaccine distribution or appointments, please email [email or web address] .[email or web address] or call [phone].

## 2022-04-20 ENCOUNTER — Other Ambulatory Visit: Payer: Self-pay

## 2022-04-20 ENCOUNTER — Emergency Department: Payer: Medicare PPO

## 2022-04-20 ENCOUNTER — Encounter: Payer: Self-pay | Admitting: Emergency Medicine

## 2022-04-20 ENCOUNTER — Emergency Department
Admission: EM | Admit: 2022-04-20 | Discharge: 2022-04-20 | Disposition: A | Discharge status: Home / Self Care | Payer: Medicare PPO | Attending: Student in an Organized Health Care Education/Training Program | Admitting: Student in an Organized Health Care Education/Training Program

## 2022-04-20 DIAGNOSIS — S8011XA Contusion of right lower leg, initial encounter: Secondary | ICD-10-CM | POA: Insufficient documentation

## 2022-04-20 DIAGNOSIS — R55 Syncope and collapse: Secondary | ICD-10-CM | POA: Diagnosis not present

## 2022-04-20 DIAGNOSIS — E119 Type 2 diabetes mellitus without complications: Secondary | ICD-10-CM | POA: Diagnosis not present

## 2022-04-20 DIAGNOSIS — M25511 Pain in right shoulder: Secondary | ICD-10-CM | POA: Diagnosis not present

## 2022-04-20 DIAGNOSIS — I10 Essential (primary) hypertension: Secondary | ICD-10-CM | POA: Insufficient documentation

## 2022-04-20 DIAGNOSIS — S8991XA Unspecified injury of right lower leg, initial encounter: Secondary | ICD-10-CM | POA: Diagnosis present

## 2022-04-20 DIAGNOSIS — S60221A Contusion of right hand, initial encounter: Secondary | ICD-10-CM | POA: Insufficient documentation

## 2022-04-20 DIAGNOSIS — W19XXXA Unspecified fall, initial encounter: Secondary | ICD-10-CM | POA: Diagnosis not present

## 2022-04-20 DIAGNOSIS — M25561 Pain in right knee: Secondary | ICD-10-CM | POA: Diagnosis not present

## 2022-04-20 HISTORY — DX: Unspecified dementia, unspecified severity, without behavioral disturbance, psychotic disturbance, mood disturbance, and anxiety: F03.90

## 2022-04-20 LAB — CBC WITH DIFFERENTIAL/PLATELET
Abs Immature Granulocytes: 0.02 10*3/uL (ref 0.00–0.07)
Basophils Absolute: 0.1 10*3/uL (ref 0.0–0.1)
Basophils Relative: 1 %
Eosinophils Absolute: 0.2 10*3/uL (ref 0.0–0.5)
Eosinophils Relative: 2 %
HCT: 40.8 % (ref 36.0–46.0)
Hemoglobin: 13.4 g/dL (ref 12.0–15.0)
Immature Granulocytes: 0 %
Lymphocytes Relative: 29 %
Lymphs Abs: 1.9 10*3/uL (ref 0.7–4.0)
MCH: 30.4 pg (ref 26.0–34.0)
MCHC: 32.8 g/dL (ref 30.0–36.0)
MCV: 92.5 fL (ref 80.0–100.0)
Monocytes Absolute: 0.5 10*3/uL (ref 0.1–1.0)
Monocytes Relative: 7 %
Neutro Abs: 4 10*3/uL (ref 1.7–7.7)
Neutrophils Relative %: 61 %
Platelets: 246 10*3/uL (ref 150–400)
RBC: 4.41 MIL/uL (ref 3.87–5.11)
RDW: 12.9 % (ref 11.5–15.5)
WBC: 6.6 10*3/uL (ref 4.0–10.5)
nRBC: 0 % (ref 0.0–0.2)

## 2022-04-20 LAB — BASIC METABOLIC PANEL
Anion gap: 5 (ref 5–15)
BUN: 19 mg/dL (ref 8–23)
CO2: 29 mmol/L (ref 22–32)
Calcium: 8.3 mg/dL — ABNORMAL LOW (ref 8.9–10.3)
Chloride: 107 mmol/L (ref 98–111)
Creatinine, Ser: 0.85 mg/dL (ref 0.44–1.00)
GFR, Estimated: >60 mL/min (ref >60)
Glucose, Bld: 114 mg/dL — ABNORMAL HIGH (ref 70–99)
Potassium: 3.4 mmol/L — ABNORMAL LOW (ref 3.5–5.1)
Sodium: 141 mmol/L (ref 135–145)

## 2022-04-20 MED ORDER — ACETAMINOPHEN 500 MG PO TABS
500.0000 mg | ORAL_TABLET | Freq: Once | ORAL | Status: AC
  Administered 2022-04-20: 500 mg via ORAL
  Filled 2022-04-20: qty 1

## 2022-04-20 MED ORDER — LIDOCAINE 5 % EX PTCH
1.0000 | MEDICATED_PATCH | CUTANEOUS | Status: DC
  Administered 2022-04-20: 1 via TRANSDERMAL
  Filled 2022-04-20: qty 1

## 2022-04-20 NOTE — ED Triage Notes (Addendum)
Per patient's report, patient c/o pain that is improving in right shoulder and right hip and knee pain. Patient states she usually walks independently, but has difficulty with balance and gait since her fall. Patient now c/o bilateral knee pain. Patient states she hit her head, but was after the fall.

## 2022-04-20 NOTE — Discharge Instructions (Addendum)
Follow-up with your primary care provider if any continued problems or concerns.  You may apply ice or moist heat to your muscles as needed for discomfort.  Continue with your regular medications.  A prescription for Lidoderm patch was sent to the pharmacy to use to your right hip as needed for discomfort.  Also the name of the orthopedist is listed on your discharge papers to follow-up for your chronic knee pain.

## 2022-04-20 NOTE — ED Notes (Signed)
See triage note  Presents s/p fall yesterday  States she missed a step  twisted her ankle and fell on her right knee on the step  Having some tenderness to right posterior hip and below her right knee

## 2022-04-20 NOTE — ED Provider Notes (Signed)
[facility] Provider Note    Event Date/Time   First MD Initiated Contact with Patient 04/20/22 [phone]     (approximate)   History   Fall   HPI  Jill Parrish is a 81 y.o. female presents to the ED with multiple complaints.  Patient states that she fell yesterday at [facility] and this morning woke with increased pain.  She denies any head injury or loss of consciousness however later she does report that she may have "passed out".  Patient states that she was able to walk to her apartment which is upstairs and she manages this without assistance.  Patient's daughter did call the ED to let us know that patient has been having problems with her right knee for some time and that she does have dementia although that was not listed in her medical history.  Patient has a history of hypertension, GERD, peripheral artery disease, diabetes, disequilibrium, chronic venous insufficiency and diverticulitis.     Physical Exam   Triage Vital Signs: ED Triage Vitals  Enc Vitals Group     BP 04/20/22 0916 (!) 177/88     Pulse Rate 04/20/22 0916 64     Resp 04/20/22 0916 18     Temp 04/20/22 0916 97.8 F (36.6 C)     Temp Source 04/20/22 0916 Oral     SpO2 04/20/22 0916 96 %     Weight 04/20/22 0923 160 lb (72.6 kg)     Height 04/20/22 0923 '5\' 5"'$  (1.651 m)     Head Circumference --      Peak Flow --      Pain Score 04/20/22 0922 8     Pain Loc --      Pain Edu? --      Excl. in [place]? --     Most recent vital signs: Vitals:   04/20/22 0916 04/20/22 1329  BP: (!) 177/88 (!) 170/80  Pulse: 64 60  Resp: 18 18  Temp: 97.8 F (36.6 C)   SpO2: 96% 96%     General: Awake, no distress.  Alert, talkative, cooperative with exam. CV:  Good peripheral perfusion.  Resp:  Normal effort.  Lungs are clear bilaterally.  No tenderness is noted on palpation of the ribs bilaterally.  No ecchymosis or skin abrasions seen. Abd:  No distention.  Soft, nontender.  Bowel sounds  normoactive x4 quadrants. Other:  Patient is able move upper extremities with minimal difficulty.  She does have a bruise to her right hand without soft tissue edema and is able to flex and extend her digits distally without any difficulty.  Patient complains of right shoulder pain.  No abrasions or soft tissue injury noted.  No crepitus with range of motion.  No pain with compression of the hips bilaterally.  No shortening of the lower extremities or rotation noted.  Patient is tender on palpation of her right knee.  No deformity or effusion is noted.  No skin abrasions noted.  Patient nontender cervical spine, thoracic and lumbar spine.   ED Results / Procedures / Treatments   Labs (all labs ordered are listed, but only abnormal results are displayed) Labs Reviewed  BASIC METABOLIC PANEL - Abnormal; Notable for the following components:      Result Value   Potassium 3.4 (*)    Glucose, Bld 114 (*)    Calcium 8.3 (*)    All other components within normal limits  CBC WITH DIFFERENTIAL/PLATELET  EKG  Vent. rate 60 BPM PR interval 146 ms QRS duration 70 ms QT/QTcB 422/422 ms P-R-T axes 140 18 99 Unusual P axis, possible ectopic atrial rhythm Nonspecific T wave abnormality   RADIOLOGY  Right shoulder x-ray images were reviewed and were negative for acute fracture or dislocation. Right hip x-ray with pelvis images were reviewed and no fracture or acute bony injury noted. Right knee x-ray images were reviewed and show degenerative changes but no acute fracture or dislocation. Right ankle x-ray was negative for fracture or dislocation. CT head and cervical spine radiology report was negative for any acute intracranial changes and acute bony abnormality.   PROCEDURES:  Critical Care performed:   Procedures   MEDICATIONS ORDERED IN ED: Medications  lidocaine (LIDODERM) 5 % 1 patch (1 patch Transdermal Patch Applied 04/20/22 1323)  acetaminophen (TYLENOL) tablet 500 mg (500  mg Oral Given 04/20/22 1323)     IMPRESSION / MDM / ASSESSMENT AND PLAN / ED COURSE  I reviewed the triage vital signs and the nursing notes.   Differential diagnosis includes, but is not limited to, fracture right hip, right shoulder, right knee, right ankle and rule out head injury or cervical spine injury secondary to fall.  81 year old female presents to the ED via EMS from [facility] after a fall that occurred yesterday.  She has multiple complaints but has continued to ambulate.  I spoke with the daughter, Wilfrid Lund who also gave information that patient does have dementia which was not listed in her initial medical report.  Daughter was made aware that x-rays and CT scan were negative and patient does not have any acute fractures but does have some contusions.  Lab work included a CBC and BMP which was essentially benign.  Patient continued to ambulate in the ED without any assistance.  A Lidoderm patch was applied to her right hip and patient was given Tylenol 500 mg p.o.  Family member will come to pick the patient up.  Patient was given a regular diet to eat while waiting for family members.  She is to follow-up with her PCP if any continued problems.  A prescription for Lidoderm patches will be sent along with a referral to orthopedics for her chronic knee pain.      Patient's presentation is most consistent with acute presentation with potential threat to life or bodily function.  FINAL CLINICAL IMPRESSION(S) / ED DIAGNOSES   Final diagnoses:  Multiple leg contusions, right, initial encounter  Fall, initial encounter     Rx / DC Orders   ED Discharge Orders     None        Note:  This document was prepared using Dragon voice recognition software and may include unintentional dictation errors.   Johnn Hai, PA-C 04/20/22 1436    Merlyn Lot, MD 04/20/22 [phone]

## 2022-04-20 NOTE — ED Triage Notes (Signed)
Pt in via EMS from [facility] with c/o mechanical fall yesterday. No LOC, no blood thinners. Pt was walking down some steps and fell backwards. Pt c/o pain to left hip and leg.

## 2022-04-21 ENCOUNTER — Telehealth: Payer: Self-pay | Admitting: Emergency Medicine

## 2022-04-21 MED ORDER — LIDOCAINE 5 % EX PTCH: 1.0000 | MEDICATED_PATCH | Freq: Two times a day (BID) | CUTANEOUS | 0 refills | Status: AC

## 2022-04-21 NOTE — Telephone Encounter (Cosign Needed)
Lidoderm patch sent to [facility] today

## 2022-04-22 ENCOUNTER — Emergency Department: Payer: Medicare PPO

## 2022-04-22 ENCOUNTER — Emergency Department
Admission: EM | Admit: 2022-04-22 | Discharge: 2022-04-22 | Disposition: A | Discharge status: Home / Self Care | Payer: Medicare PPO | Attending: Emergency Medicine | Admitting: Emergency Medicine

## 2022-04-22 ENCOUNTER — Other Ambulatory Visit: Payer: Self-pay

## 2022-04-22 DIAGNOSIS — R350 Frequency of micturition: Secondary | ICD-10-CM | POA: Insufficient documentation

## 2022-04-22 DIAGNOSIS — M25551 Pain in right hip: Secondary | ICD-10-CM | POA: Insufficient documentation

## 2022-04-22 DIAGNOSIS — W19XXXA Unspecified fall, initial encounter: Secondary | ICD-10-CM | POA: Diagnosis not present

## 2022-04-22 LAB — BASIC METABOLIC PANEL
Anion gap: 9 (ref 5–15)
BUN: 29 mg/dL — ABNORMAL HIGH (ref 8–23)
CO2: 24 mmol/L (ref 22–32)
Calcium: 9.1 mg/dL (ref 8.9–10.3)
Chloride: 110 mmol/L (ref 98–111)
Creatinine, Ser: 1 mg/dL (ref 0.44–1.00)
GFR, Estimated: 56 mL/min — ABNORMAL LOW (ref >60)
Glucose, Bld: 126 mg/dL — ABNORMAL HIGH (ref 70–99)
Potassium: 3.6 mmol/L (ref 3.5–5.1)
Sodium: 143 mmol/L (ref 135–145)

## 2022-04-22 LAB — URINALYSIS, ROUTINE W REFLEX MICROSCOPIC
Bilirubin Urine: NEGATIVE
Glucose, UA: NEGATIVE mg/dL
Ketones, ur: NEGATIVE mg/dL
Leukocytes,Ua: NEGATIVE
Nitrite: NEGATIVE
Protein, ur: 30 mg/dL — AB
Specific Gravity, Urine: 1.011 (ref 1.005–1.030)
pH: 5 (ref 5.0–8.0)

## 2022-04-22 LAB — CBC WITH DIFFERENTIAL/PLATELET
Abs Immature Granulocytes: 0.01 10*3/uL (ref 0.00–0.07)
Basophils Absolute: 0.1 10*3/uL (ref 0.0–0.1)
Basophils Relative: 1 %
Eosinophils Absolute: 0.2 10*3/uL (ref 0.0–0.5)
Eosinophils Relative: 3 %
HCT: 41.4 % (ref 36.0–46.0)
Hemoglobin: 13.7 g/dL (ref 12.0–15.0)
Immature Granulocytes: 0 %
Lymphocytes Relative: 23 %
Lymphs Abs: 1.3 10*3/uL (ref 0.7–4.0)
MCH: 29.8 pg (ref 26.0–34.0)
MCHC: 33.1 g/dL (ref 30.0–36.0)
MCV: 90 fL (ref 80.0–100.0)
Monocytes Absolute: 0.4 10*3/uL (ref 0.1–1.0)
Monocytes Relative: 7 %
Neutro Abs: 4 10*3/uL (ref 1.7–7.7)
Neutrophils Relative %: 66 %
Platelets: 256 10*3/uL (ref 150–400)
RBC: 4.6 MIL/uL (ref 3.87–5.11)
RDW: 13.1 % (ref 11.5–15.5)
WBC: 6 10*3/uL (ref 4.0–10.5)
nRBC: 0 % (ref 0.0–0.2)

## 2022-04-22 MED ORDER — ONDANSETRON HCL 4 MG/2ML IJ SOLN
4.0000 mg | Freq: Once | INTRAMUSCULAR | Status: AC
  Administered 2022-04-22: 4 mg via INTRAVENOUS
  Filled 2022-04-22: qty 2

## 2022-04-22 MED ORDER — TRAMADOL HCL 50 MG PO TABS: 50.0000 mg | ORAL_TABLET | Freq: Four times a day (QID) | ORAL | 0 refills | Status: AC | PRN

## 2022-04-22 MED ORDER — TRAMADOL HCL 50 MG PO TABS
50.0000 mg | ORAL_TABLET | Freq: Once | ORAL | Status: AC
  Administered 2022-04-22: 50 mg via ORAL
  Filled 2022-04-22: qty 1

## 2022-04-22 MED ORDER — FENTANYL CITRATE PF 50 MCG/ML IJ SOSY
50.0000 ug | PREFILLED_SYRINGE | Freq: Once | INTRAMUSCULAR | Status: AC
  Administered 2022-04-22: 50 ug via INTRAVENOUS
  Filled 2022-04-22: qty 1

## 2022-04-22 NOTE — ED Triage Notes (Addendum)
Pt to ED [facility] for burning with urination, increased frequency with urination. Also c/o not wanting to get out of bed d/t right hip pain. Seen at ED a few days ago for fall. Xrays neg

## 2022-04-22 NOTE — ED Notes (Signed)
Pt states does not feel like she can get up to provide urine sample. Placed on purewick

## 2022-04-22 NOTE — ED Provider Notes (Signed)
MRI is negative for fracture or other kind of joint pathology.  Patient walks with a walker and after tramadol.  She does want to go home.  We will let her do so.  She complains of pain in her buttocks wrapping around the leg going down to her foot.  Comes anteriorly.  On exam patient has no straight leg raise.  She has no weakness 1 foot to the other.  There is no midline back pain either.  I cannot really get good DTRs.  Patient does not report any numbness.  It is possible she has some mild sciatica.  With no spinal tenderness I will not do any imaging right now especially with no numbness or weakness.  I will let the patient go as planned.   Nena Polio, MD 04/22/22 [phone]

## 2022-04-22 NOTE — Discharge Instructions (Addendum)
Please seek medical attention for any high fevers, chest pain, shortness of breath, change in behavior, persistent vomiting, bloody stool or any other new or concerning symptoms.  

## 2022-04-22 NOTE — ED Notes (Signed)
Attempted to ambulate pt, pt able to take 2-3 steps, stops and c/o pain to right hip

## 2022-04-22 NOTE — ED Provider Notes (Signed)
[facility] Provider Note    Event Date/Time   First MD Initiated Contact with Patient 04/22/22 1024     (approximate)   History   Urinary Frequency and Hip Pain   HPI  Jill Parrish is a 81 y.o. female  who presents to the emergency department today because of concern for continued right hip pain and possible UTI. The patient had a fall a few days ago. Was seen in the ER with negative imaging, however since leaving has continued to have right hip pain. It is severe enough it interferes with the patient's ability to ambulate.  In addition there was some concern for possible UTI given odor, however daughter at bedside believes the odor is related to the patient's dog.     Physical Exam   Triage Vital Signs: ED Triage Vitals [04/22/22 1016]  Enc Vitals Group     BP (!) 151/65     Pulse Rate 62     Resp 16     Temp 98.3 F (36.8 C)     Temp Source Oral     SpO2 99 %     Weight 158 lb 11.7 oz (72 kg)     Height '5\' 5"'$  (1.651 m)     Head Circumference      Peak Flow      Pain Score 8     Pain Loc      Pain Edu?      Excl. in [place]?     Most recent vital signs: Vitals:   04/22/22 1016  BP: (!) 151/65  Pulse: 62  Resp: 16  Temp: 98.3 F (36.8 C)  SpO2: 99%    General: Awake, alert, oriented. CV:  Good peripheral perfusion. Regular rate and rhythm Resp:  Normal effort. Lungs clear. Abd:  No distention.  Other:  Tender to palpation of the right upper thigh.    ED Results / Procedures / Treatments   Labs (all labs ordered are listed, but only abnormal results are displayed) Labs Reviewed  URINALYSIS, ROUTINE W REFLEX MICROSCOPIC - Abnormal; Notable for the following components:      Result Value   Color, Urine YELLOW (*)    APPearance CLEAR (*)    Hgb urine dipstick SMALL (*)    Protein, ur 30 (*)    Bacteria, UA RARE (*)    All other components within normal limits  BASIC METABOLIC PANEL - Abnormal; Notable for the following  components:   Glucose, Bld 126 (*)    BUN 29 (*)    GFR, Estimated 56 (*)    All other components within normal limits  URINE CULTURE  CBC WITH DIFFERENTIAL/PLATELET     EKG  None   RADIOLOGY I independently interpreted and visualized the right hip ct. My interpretation: No fracture Radiology interpretation:  IMPRESSION:  1. No evidence of acute fracture or dislocation.  2. Chronic retracted tears of the right gluteus minimus and medius  tendons with associated calcifications along the retracted tendons  and muscular atrophy.  3. No acute periarticular soft tissue abnormalities are identified.      PROCEDURES:  Critical Care performed: No  Procedures   MEDICATIONS ORDERED IN ED: Medications - No data to display   IMPRESSION / MDM / [place] / ED COURSE  I reviewed the triage vital signs and the nursing notes.  Differential diagnosis includes, but is not limited to, fracture, dislocation, bursitis.  Patient's presentation is most consistent with acute presentation with potential threat to life or bodily function.  Patient presented to the emergency department today because of concerns for right hip pain.  On exam she does have some tenderness to manipulation..  Additionally there is concerns for possible urinary tract infection.  You a here without concerning findings for infection.  Did get imaging of the right hip including CT scan which did not show any acute fracture.  Will give patient tramadol and then try to ambulate.     FINAL CLINICAL IMPRESSION(S) / ED DIAGNOSES   Final diagnoses:  Right hip pain      Note:  This document was prepared using Dragon voice recognition software and may include unintentional dictation errors.    Nance Pear, MD 04/22/22 [phone]

## 2022-04-23 LAB — URINE CULTURE: Culture: NO GROWTH

## 2022-06-02 ENCOUNTER — Encounter (INDEPENDENT_AMBULATORY_CARE_PROVIDER_SITE_OTHER): Payer: Self-pay

## 2023-06-02 NOTE — Progress Notes (Signed)
 Jill Parrish is a 82 y.o. female who is here for an acute visit.  They noticed a lesion in between her breasts approximately 3 weeks ago. It was communicated to us  that she had a breast mass but she states that it is more of an area that is irritated. She thinks it has been getting better but they think something needs to be done about it.   Patient Active Problem List  Diagnosis  . Essential hypertension, benign  . Esophageal reflux  . Pure hypercholesterolemia  . Elevated fasting blood sugar  . Hx of adenomatous polyp of colon  . Syncope  . DOE (dyspnea on exertion)  . Abdominal pain, generalized  . Diabetes mellitus without complication (CMS/HHS-HCC)  . PAD (peripheral artery disease) (CMS-HCC)  . Hypertension  . Chronic venous insufficiency  . History of gastroesophageal reflux (GERD)  . Hepatocellular carcinoma (CMS/HHS-HCC)  . Elevated ALT measurement  . MCI (mild cognitive impairment) with memory loss  . Mild late onset Alzheimer's dementia without behavioral disturbance, psychotic disturbance, mood disturbance, or anxiety (CMS/HHS-HCC)    Past Medical History:  Diagnosis Date  . Abdominal pain, generalized 11/13/2016  . Anesthesia complication    slow to awaken   . Arthritis   . Chest pain syndrome March 2004   Felt secondary to GERD, with normal Myoview  treadmill stress test and echocardiogram.  . Clotting disorder (CMS/HHS-HCC) 2018   took baby aspirin  per family dr. recommendation  . Dementia (CMS/HHS-HCC) 2018   mild memory loss.  I am no sure this is dementia?  . Depression   . Diabetes mellitus type 2, uncomplicated (CMS/HHS-HCC)   . Diverticulitis 2017  . DOE (dyspnea on exertion) 11/13/2016  . Elevated ALT measurement 04/07/2018  . GERD (gastroesophageal reflux disease)   . Hepatic lesion 04/07/2018  . History of chickenpox   . Hx of adenomatous polyp of colon 11/13/2016  . Hyperlipidemia   . Hypertension   . Liver disease 2019  . Recurrent vertigo   . Renal  cyst, left    By ultrasound in January 2002.  . 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 Ike.  . Stroke (CMS/HHS-HCC) May 2014   Right TIA May 2014 manifested by left-sided paresthesias. Evaluation with MRI brain and ultrasound carotids, echocardiogram unremarkable.  . Vasomotor rhinitis    Chronic.     Current Outpatient Medications:  .  acetaminophen  (TYLENOL ) 500 MG tablet, Take 1 to 2 tablets by mouth every eight hours as needed.   , Disp: , Rfl:  .  amLODIPine  (NORVASC ) 5 MG tablet, take 1 tablet by mouth daily, Disp: 90 tablet, Rfl: 0 .  cholecalciferol 1000 unit tablet, Take 1,000 Units by mouth once daily, Disp: , Rfl:  .  dietary supplement Cap, Juice Plus Supplement  - Take 2 tablets by mouth twice a day   , Disp: , Rfl:  .  hydroCHLOROthiazide  (HYDRODIURIL ) 12.5 MG tablet, take 1 tablet by mouth daily, Disp: 90 tablet, Rfl: 0 .  memantine (NAMENDA) 5 MG tablet, Take 1 tablet (5 mg total) by mouth 2 (two) times daily, Disp: 180 tablet, Rfl: 1 .  telmisartan  (MICARDIS ) 80 MG tablet, take 1 tablet by mouth daily, Disp: 90 tablet, Rfl: 0  Allergies  Allergen Reactions  . Allegra [Fexofenadine] Dizziness  . Aspirin  Other (See Comments)    GI upset with high doses.  . Atorvastatin  Muscle Pain  . Cefzil [Cefprozil] Other (See Comments) and Dizziness  Abdominal pain.  . Ciprofloxacin  Rash  . Claritin [Loratadine] Dizziness  . Erythromycin Ethylsuccinate Other (See Comments)    GI upset.  . Ilosone [Erythromycin Estolate] Dizziness  . Keflex [Cephalexin] Itching  . Lidocaine  Other (See Comments)  . Minocin [Minocycline] Other (See Comments) and Dizziness    GI upset.  . Pravastatin Rash  . Sulfa (Sulfonamide Antibiotics) Dizziness    Results for orders placed or performed in visit on 05/26/23  Urinalysis w/Microscopic  Result Value Ref Range   Color Yellow Colorless, Straw, Light Yellow, Yellow, Dark Yellow    Clarity Clear Clear   Specific Gravity 1.025 1.000 - 1.030   pH, Urine 5.5 5.0 - 8.0   Protein, Urinalysis Negative Negative, Trace mg/dL   Glucose, Urinalysis Negative Negative mg/dL   Ketones, Urinalysis Negative Negative mg/dL   Blood, Urinalysis Negative Negative   Nitrite, Urinalysis Negative Negative   Leukocyte Esterase, Urinalysis Negative Negative   White Blood Cells, Urinalysis None Seen None Seen, 0-3 /hpf   Red Blood Cells, Urinalysis None Seen None Seen, 0-3 /hpf   Bacteria, Urinalysis Rare (!) None Seen /hpf   Squamous Epithelial Cells, Urinalysis Few Rare, Few, None Seen /hpf   Narrative   Small mucous    Review of Systems  No fever, chills, nausea, or vomiting  Exam  Vitals:   06/02/23 1613  BP: 118/72  Pulse: 76  SpO2: 98%  Weight: 78.9 kg (174 lb)  Height: 165.1 cm (5' 5)    Body mass index is 28.96 kg/m.  General: Patient is well-groomed, well-nourished, appears stated age in no acute distress  HEENT: head is atraumatic and normocephalic, neck is supple with no palpable nodules  CV: Regular rhythm and normal heart rate, no murmur, no JVD  Respiratory: Clear to auscultation throughout lung fields with no wheezing, crackles, or rhonchi. No increased work of breathing  Skin: She has some erythematous rash with satelite lesions in between her breasts. She has a firm, indurated, moderately tender abscess that is ~ 2 cm in diameter  Impression/Plan  Breast abscess: I believe she has a fungal rash that likely was itching and by scratching this led to a breast abscess. I will treat with a topical antifungal cream and have her take doxycycline BID for 10 days. If this does not get better, we might need to do an I + D. She has a lot of listed allergies to antibiotics so I think her best option is going to be doxycycline.   This visit was part of longitudinal care for this patient involving long-term management of chronic medical conditions.

## 2023-10-29 NOTE — Progress Notes (Signed)
 Jill Parrish is a 83 y.o. female who is here for a follow up visit  She had new onset urinary incontinence. I tested for a UTI and it was positive. I treated her for a UTI with macrobid that was proven to be culture sensitive. She had improvement in her symptoms temporarily but she has had some recurrence.   Patient Active Problem List  Diagnosis  . Essential hypertension, benign  . Esophageal reflux  . Pure hypercholesterolemia  . Elevated fasting blood sugar  . Hx of adenomatous polyp of colon  . Syncope  . DOE (dyspnea on exertion)  . Abdominal pain, generalized  . Diabetes mellitus without complication (CMS/HHS-HCC)  . PAD (peripheral artery disease) (CMS-HCC)  . Hypertension  . Chronic venous insufficiency  . History of gastroesophageal reflux (GERD)  . Hepatocellular carcinoma (CMS/HHS-HCC)  . Elevated ALT measurement  . MCI (mild cognitive impairment) with memory loss  . Mild late onset Alzheimer's dementia without behavioral disturbance, psychotic disturbance, mood disturbance, or anxiety (CMS/HHS-HCC)    Past Medical History:  Diagnosis Date  . Abdominal pain, generalized 11/13/2016  . Anesthesia complication    slow to awaken   . Arthritis   . Chest pain syndrome March 2004   Felt secondary to GERD, with normal Myoview  treadmill stress test and echocardiogram.  . Clotting disorder (CMS/HHS-HCC) 2018   took baby aspirin  per family dr. recommendation  . Dementia (CMS/HHS-HCC) 2018   mild memory loss.  I am no sure this is dementia?  . Depression   . Diabetes mellitus type 2, uncomplicated (CMS/HHS-HCC)   . Diverticulitis 2017  . DOE (dyspnea on exertion) 11/13/2016  . Elevated ALT measurement 04/07/2018  . GERD (gastroesophageal reflux disease)   . Hepatic lesion 04/07/2018  . History of chickenpox   . Hx of adenomatous polyp of colon 11/13/2016  . Hyperlipidemia   . Hypertension   . Liver disease 2019  . Recurrent vertigo   . Renal cyst, left    By ultrasound in  January 2002.  . 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 Ike.  . Stroke (CMS/HHS-HCC) May 2014   Right TIA May 2014 manifested by left-sided paresthesias. Evaluation with MRI brain and ultrasound carotids, echocardiogram unremarkable.  . Vasomotor rhinitis    Chronic.     Current Outpatient Medications:  .  acetaminophen  (TYLENOL ) 500 MG tablet, Take 1 to 2 tablets by mouth every eight hours as needed.   , Disp: , Rfl:  .  amLODIPine  (NORVASC ) 5 MG tablet, TAKE 1 TABLET BY MOUTH DAILY, Disp: 90 tablet, Rfl: 1 .  cholecalciferol 1000 unit tablet, Take 1,000 Units by mouth once daily, Disp: , Rfl:  .  dietary supplement Cap, Juice Plus Supplement  - Take 2 tablets by mouth twice a day   , Disp: , Rfl:  .  hydroCHLOROthiazide  (HYDRODIURIL ) 12.5 MG tablet, TAKE 1 TABLET BY MOUTH DAILY, Disp: 90 tablet, Rfl: 1 .  ketoconazole (NIZORAL) 2 % cream, APPLY TO AFFECTED AREA TWICE A DAY FOR 14 DAYS, Disp: 30 g, Rfl: 0 .  memantine (NAMENDA) 5 MG tablet, Take 1 tablet (5 mg total) by mouth 2 (two) times daily, Disp: 180 tablet, Rfl: 1 .  telmisartan  (MICARDIS ) 80 MG tablet, TAKE 1 TABLET BY MOUTH DAILY, Disp: 90 tablet, Rfl: 1  Allergies  Allergen Reactions  . Allegra [Fexofenadine] Dizziness  . Aspirin  Other (See Comments)    GI upset with high doses.  . Atorvastatin   Muscle Pain  . Cefzil [Cefprozil] Other (See Comments) and Dizziness    Abdominal pain.  . Ciprofloxacin  Rash  . Claritin [Loratadine] Dizziness  . Erythromycin Ethylsuccinate Other (See Comments)    GI upset.  . Ilosone [Erythromycin Estolate] Dizziness  . Keflex [Cephalexin] Itching  . Lidocaine  Other (See Comments)  . Minocin [Minocycline] Other (See Comments) and Dizziness    GI upset.  . Pravastatin Rash  . Sulfa (Sulfonamide Antibiotics) Dizziness    Results for orders placed or performed in visit on 09/11/23  Urine Culture, Routine - [facility]   Specimen: Urine,  Clean Catch  Result Value Ref Range   Urine Culture, Routine - [facility] Final report (!)    Result 1 - [facility] Escherichia coli (!)    Result 2 - [facility] Comment    Antimicrobial Susceptibility - [facility] Comment    Narrative   Performed at:  [facility] - [facility] [address] [place], [place]  [id number] Lab Director: Frankey Sas MD, Phone:  [phone]  Urinalysis w/Microscopic  Result Value Ref Range   Color Yellow Colorless, Straw, Light Yellow, Yellow, Dark Yellow   Clarity Cloudy (!) Clear   Specific Gravity 1.025 1.000 - 1.030   pH, Urine 5.5 5.0 - 8.0   Protein, Urinalysis Trace Negative, Trace mg/dL   Glucose, Urinalysis Negative Negative mg/dL   Ketones, Urinalysis Trace (!) Negative mg/dL   Blood, Urinalysis Trace (!) Negative   Nitrite, Urinalysis Positive (!) Negative   Leukocyte Esterase, Urinalysis Moderate (!) Negative   White Blood Cells, Urinalysis 10-50 (!) None Seen, 0-3 /hpf   Red Blood Cells, Urinalysis 0-3 None Seen, 0-3 /hpf   Bacteria, Urinalysis Many (!) None Seen /hpf   Squamous Epithelial Cells, Urinalysis Rare Rare, Few, None Seen /hpf     Review of Systems  No fever, chills, nausea, or vomiting   Exam  Vitals:   10/29/23 1137  BP: 124/80  Pulse: 64  SpO2: 95%  Weight: 79.8 kg (176 lb)  Height: 165.1 cm (5' 5)    Body mass index is 29.29 kg/m.  General: Patient is well-groomed, well-nourished, appears stated age in no acute distress  HEENT: head is atraumatic and normocephalic, neck is supple with no palpable nodules  CV: Regular rhythm and normal heart rate, no murmur, no JVD  Respiratory: Clear to auscultation throughout lung fields with no wheezing, crackles, or rhonchi. No increased work of breathing   Impression/Plan  Urinary incontinence: I will repeat a UA and culture to decide if she needs to be treated again. I discussed that we could do a trial of a medicine particularly at night to try to help her with her  incontinence.

## 2023-11-13 NOTE — Progress Notes (Signed)
 Jill Parrish is a 83 y.o. female who is here for an acute visit.  She is here with her daughter and they were concerned about a lesion on her thoracic back. It has been present for a year but her daughter noticed that it seemed enlarged and red recently. However, she noticed prior to coming today that it seemed back to baseline.  Patient Active Problem List  Diagnosis  . Essential hypertension, benign  . Esophageal reflux  . Pure hypercholesterolemia  . Elevated fasting blood sugar  . Hx of adenomatous polyp of colon  . Syncope  . DOE (dyspnea on exertion)  . Abdominal pain, generalized  . Diabetes mellitus without complication (CMS/HHS-HCC)  . PAD (peripheral artery disease) ()  . Hypertension  . Chronic venous insufficiency  . History of gastroesophageal reflux (GERD)  . Hepatocellular carcinoma (CMS/HHS-HCC)  . Elevated ALT measurement  . MCI (mild cognitive impairment) with memory loss  . Mild late onset Alzheimer's dementia without behavioral disturbance, psychotic disturbance, mood disturbance, or anxiety (CMS/HHS-HCC)    Past Medical History:  Diagnosis Date  . Abdominal pain, generalized 11/13/2016  . Anesthesia complication    slow to awaken   . Arthritis   . Chest pain syndrome March 2004   Felt secondary to GERD, with normal Myoview  treadmill stress test and echocardiogram.  . Clotting disorder (CMS/HHS-HCC) 2018   took baby aspirin  per family dr. recommendation  . Dementia (CMS/HHS-HCC) 2018   mild memory loss.  I am no sure this is dementia?  . Depression   . Diabetes mellitus type 2, uncomplicated (CMS/HHS-HCC)   . Diverticulitis 2017  . DOE (dyspnea on exertion) 11/13/2016  . Elevated ALT measurement 04/07/2018  . GERD (gastroesophageal reflux disease)   . Hepatic lesion 04/07/2018  . History of chickenpox   . Hx of adenomatous polyp of colon 11/13/2016  . Hyperlipidemia   . Hypertension   . Liver disease 2019  . Recurrent vertigo   . Renal cyst, left    By  ultrasound in January 2002.  . 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 Ike.  . Stroke (CMS/HHS-HCC) May 2014   Right TIA May 2014 manifested by left-sided paresthesias. Evaluation with MRI brain and ultrasound carotids, echocardiogram unremarkable.  . Vasomotor rhinitis    Chronic.     Current Outpatient Medications:  .  acetaminophen  (TYLENOL ) 500 MG tablet, Take 1 to 2 tablets by mouth every eight hours as needed.   , Disp: , Rfl:  .  amLODIPine  (NORVASC ) 5 MG tablet, TAKE 1 TABLET BY MOUTH DAILY, Disp: 90 tablet, Rfl: 1 .  cholecalciferol 1000 unit tablet, Take 1,000 Units by mouth once daily, Disp: , Rfl:  .  dietary supplement Cap, Juice Plus Supplement  - Take 2 tablets by mouth twice a day   , Disp: , Rfl:  .  hydroCHLOROthiazide  (HYDRODIURIL ) 12.5 MG tablet, TAKE 1 TABLET BY MOUTH DAILY, Disp: 90 tablet, Rfl: 1 .  ketoconazole (NIZORAL) 2 % cream, APPLY TO AFFECTED AREA TWICE A DAY FOR 14 DAYS, Disp: 30 g, Rfl: 0 .  memantine (NAMENDA) 5 MG tablet, Take 1 tablet (5 mg total) by mouth 2 (two) times daily, Disp: 180 tablet, Rfl: 1 .  oxyBUTYnin (DITROPAN) 5 mg tablet, Take 1 tablet (5 mg total) by mouth every evening, Disp: 90 tablet, Rfl: 0 .  telmisartan  (MICARDIS ) 80 MG tablet, TAKE 1 TABLET BY MOUTH DAILY, Disp: 90 tablet, Rfl: 1  Allergies  Allergen Reactions  . Allegra [Fexofenadine] Dizziness  . Aspirin  Other (See Comments)    GI upset with high doses.  . Atorvastatin  Muscle Pain  . Cefzil [Cefprozil] Other (See Comments) and Dizziness    Abdominal pain.  . Ciprofloxacin  Rash  . Claritin [Loratadine] Dizziness  . Erythromycin Ethylsuccinate Other (See Comments)    GI upset.  . Ilosone [Erythromycin Estolate] Dizziness  . Keflex [Cephalexin] Itching  . Lidocaine  Other (See Comments)  . Minocin [Minocycline] Other (See Comments) and Dizziness    GI upset.  . Pravastatin Rash  . Sulfa (Sulfonamide Antibiotics)  Dizziness    Results for orders placed or performed in visit on 10/29/23  Urine Culture, Routine - [facility]   Specimen: Urine, Clean Catch  Result Value Ref Range   Urine Culture, Routine - [facility] Final report    Result 1 - [facility] Comment    Narrative   Performed at:  [address] [employer] [address], [place], [place]  [id number] Lab Director: Frankey Sas MD, Phone:  [phone]  Urinalysis w/Microscopic  Result Value Ref Range   Color Yellow Colorless, Straw, Light Yellow, Yellow, Dark Yellow   Clarity SL Cloudy (!) Clear   Specific Gravity <=1.005 1.000 - 1.030   pH, Urine 6.5 5.0 - 8.0   Protein, Urinalysis Negative Negative, Trace mg/dL   Glucose, Urinalysis Negative Negative mg/dL   Ketones, Urinalysis Negative Negative mg/dL   Blood, Urinalysis Trace (!) Negative   Nitrite, Urinalysis Negative Negative   Leukocyte Esterase, Urinalysis Negative Negative   White Blood Cells, Urinalysis 0-3 None Seen, 0-3 /hpf   Red Blood Cells, Urinalysis 0-3 None Seen, 0-3 /hpf   Bacteria, Urinalysis Moderate (!) None Seen /hpf   Squamous Epithelial Cells, Urinalysis Rare Rare, Few, None Seen /hpf    Review of Systems  No fever, chills, nausea, or vomiting  Exam  Vitals:   11/13/23 1542  BP: 124/80  Pulse: 80  SpO2: 95%  Weight: 83.9 kg (185 lb)  Height: 165.1 cm (5' 5)    Body mass index is 30.79 kg/m.  General: Patient is well-groomed, well-nourished, appears stated age in no acute distress  Skin: ~ 1 cm diameter firm, non-tender, sebaceous cyst  Impression/Plan  Sebaceous cyst: this lesion is consistent with a sebaceous cyst that previously opened up and drained. It is non-tender and not currently infected. I recommended leaving it alone.  LE edema: She is already taking hctz. I don't want her to be on more aggressive diuretics. I recommended doing compression stockings.   This visit was part of longitudinal care for this patient involving long-term  management of chronic medical conditions.

## 2023-11-28 ENCOUNTER — Encounter: Payer: Self-pay | Admitting: *Deleted

## 2023-11-28 ENCOUNTER — Other Ambulatory Visit: Payer: Self-pay

## 2023-11-28 ENCOUNTER — Emergency Department
Admission: EM | Admit: 2023-11-28 | Discharge: 2023-11-28 | Disposition: A | Discharge status: Home / Self Care | Attending: Emergency Medicine | Admitting: Emergency Medicine

## 2023-11-28 DIAGNOSIS — E119 Type 2 diabetes mellitus without complications: Secondary | ICD-10-CM | POA: Diagnosis not present

## 2023-11-28 DIAGNOSIS — I10 Essential (primary) hypertension: Secondary | ICD-10-CM | POA: Insufficient documentation

## 2023-11-28 DIAGNOSIS — R42 Dizziness and giddiness: Secondary | ICD-10-CM | POA: Insufficient documentation

## 2023-11-28 DIAGNOSIS — R55 Syncope and collapse: Secondary | ICD-10-CM | POA: Diagnosis present

## 2023-11-28 DIAGNOSIS — F0390 Unspecified dementia without behavioral disturbance: Secondary | ICD-10-CM | POA: Diagnosis not present

## 2023-11-28 LAB — CBC WITH DIFFERENTIAL/PLATELET
Abs Immature Granulocytes: 0.02 10*3/uL (ref 0.00–0.07)
Basophils Absolute: 0.1 10*3/uL (ref 0.0–0.1)
Basophils Relative: 1 %
Eosinophils Absolute: 0.1 10*3/uL (ref 0.0–0.5)
Eosinophils Relative: 1 %
HCT: 44.8 % (ref 36.0–46.0)
Hemoglobin: 14.7 g/dL (ref 12.0–15.0)
Immature Granulocytes: 0 %
Lymphocytes Relative: 15 %
Lymphs Abs: 1.5 10*3/uL (ref 0.7–4.0)
MCH: 30.2 pg (ref 26.0–34.0)
MCHC: 32.8 g/dL (ref 30.0–36.0)
MCV: 92.2 fL (ref 80.0–100.0)
Monocytes Absolute: 0.7 10*3/uL (ref 0.1–1.0)
Monocytes Relative: 7 %
Neutro Abs: 7.4 10*3/uL (ref 1.7–7.7)
Neutrophils Relative %: 76 %
Platelets: 316 10*3/uL (ref 150–400)
RBC: 4.86 MIL/uL (ref 3.87–5.11)
RDW: 12.7 % (ref 11.5–15.5)
WBC: 9.7 10*3/uL (ref 4.0–10.5)
nRBC: 0 % (ref 0.0–0.2)

## 2023-11-28 LAB — BASIC METABOLIC PANEL WITH GFR
Anion gap: 9 (ref 5–15)
BUN: 25 mg/dL — ABNORMAL HIGH (ref 8–23)
CO2: 23 mmol/L (ref 22–32)
Calcium: 8.6 mg/dL — ABNORMAL LOW (ref 8.9–10.3)
Chloride: 109 mmol/L (ref 98–111)
Creatinine, Ser: 1.02 mg/dL — ABNORMAL HIGH (ref 0.44–1.00)
GFR, Estimated: 55 mL/min — ABNORMAL LOW (ref >60)
Glucose, Bld: 159 mg/dL — ABNORMAL HIGH (ref 70–99)
Potassium: 3.7 mmol/L (ref 3.5–5.1)
Sodium: 141 mmol/L (ref 135–145)

## 2023-11-28 MED ORDER — LACTATED RINGERS IV BOLUS
1000.0000 mL | Freq: Once | INTRAVENOUS | Status: AC
  Administered 2023-11-28: 1000 mL via INTRAVENOUS

## 2023-11-28 NOTE — ED Triage Notes (Signed)
 BIB [facility] from [facility] s/p near syncope getting out of the shower. Denies fall, LOC, or hitting head. Baseline dementia. VSS. NSR. 120/64, HR 60, RR 18, SPO2 RA 98%, CBG 171. Alert, NAD, calm, interactive.

## 2023-11-28 NOTE — ED Notes (Signed)
 Alert, NAD, calm, interactive, VSS. Caregiver at [facility]. Pending arrival of son for ride back to facility. Arrival imminent.

## 2023-11-28 NOTE — ED Notes (Signed)
 Caregiver at [facility]. Waiting on pt's son to arrive for ride home. No changes. Pt alert, NAD, calm, interactive. Denies sx or complaints.

## 2023-11-28 NOTE — ED Notes (Signed)
 Attempted call to [facility]

## 2023-11-28 NOTE — ED Provider Notes (Signed)
 [facility] Provider Note    Event Date/Time   First MD Initiated Contact with Patient 11/28/23 1021     (approximate)   History   Near Syncope   HPI  Jill Parrish is a 83 y.o. female who presents to the ED for evaluation of Near Syncope   Review of PCP visit from 2 weeks ago.  Seen for sebaceous cyst on her back.  She otherwise has a history of HTN, GERD, dementia, DM and PAD.  Patient presents to the ED via EMS from a local SNF for evaluation of an episode of presyncope when getting out of the shower.  Reportedly felt dizzy when getting out of the shower and this has since resolved and patient's nurse at the SNF wanted to get her checked out.  Here in the ED patient has no complaints and does not recall this episode.  She reports feeling great.    Physical Exam   Triage Vital Signs: ED Triage Vitals  Encounter Vitals Group     BP      Systolic BP Percentile      Diastolic BP Percentile      Pulse      Resp      Temp      Temp src      SpO2      Weight      Height      Head Circumference      Peak Flow      Pain Score      Pain Loc      Pain Education      Exclude from Growth Chart     Most recent vital signs: Vitals:   11/28/23 1245 11/28/23 1300  BP: (!) 153/57 (!) 140/69  Pulse: (!) 59 (!) 50  Resp: (!) 24 14  Temp:    SpO2: 98% 99%    General: Awake, no distress.  CV:  Good peripheral perfusion.  Resp:  Normal effort.  Abd:  No distention.  MSK:  No deformity noted.  Neuro:  No focal deficits appreciated. Other:     ED Results / Procedures / Treatments   Labs (all labs ordered are listed, but only abnormal results are displayed) Labs Reviewed  BASIC METABOLIC PANEL WITH GFR - Abnormal; Notable for the following components:      Result Value   Glucose, Bld 159 (*)    BUN 25 (*)    Creatinine, Ser 1.02 (*)    Calcium  8.6 (*)    GFR, Estimated 55 (*)    All other components within normal limits  CBC WITH  DIFFERENTIAL/PLATELET  URINALYSIS, ROUTINE W REFLEX MICROSCOPIC    EKG Sinus rhythm with a rate of 57 bpm.  Normal axis and intervals without clear signs of acute ischemia.  RADIOLOGY   Official radiology report(s): No results found.  PROCEDURES and INTERVENTIONS:  Procedures  Medications  lactated ringers bolus 1,000 mL (0 mLs Intravenous Stopped 11/28/23 1202)     IMPRESSION / MDM / ASSESSMENT AND PLAN / ED COURSE  I reviewed the triage vital signs and the nursing notes.  Differential diagnosis includes, but is not limited to, vasovagal episode, dehydration, AKI, UTI, symptomatic anemia, cardiac dysrhythmia, TIA, CVA  {Patient presents with symptoms of an acute illness or injury that is potentially life-threatening.  Pleasantly demented patient presents after a reported near syncopal episode coming out of the shower without evidence of acute pathology suitable for return to facility.  Asymptomatic  here with a normal exam and reassuring workup with no dysrhythmias, reassuring EKG, normal CBC and metabolic panel with renal dysfunction around baseline.  She was observed for a few hours without any acute changes.  Suitable for return to facility.  Clinical Course as of 11/28/23 1430  Sat Nov 28, 2023  1140 Reassessed.  Her caregivers at the bedside provide some supplemental history.  Patient remains at baseline without symptoms [DS]    Clinical Course User Index [DS] Arline Bennett, MD     FINAL CLINICAL IMPRESSION(S) / ED DIAGNOSES   Final diagnoses:  Near syncope     Rx / DC Orders   ED Discharge Orders     None        Note:  This document was prepared using Dragon voice recognition software and may include unintentional dictation errors.   Arline Bennett, MD 11/28/23 [phone]

## 2023-11-28 NOTE — ED Notes (Signed)
 Attempted call x2 to [facility]

## 2024-02-16 ENCOUNTER — Other Ambulatory Visit: Payer: Self-pay

## 2024-02-16 ENCOUNTER — Emergency Department: Admission: EM | Admit: 2024-02-16 | Discharge: 2024-02-16 | Disposition: A | Discharge status: Home / Self Care

## 2024-02-16 ENCOUNTER — Telehealth: Payer: Self-pay | Admitting: Cardiovascular Disease

## 2024-02-16 DIAGNOSIS — E119 Type 2 diabetes mellitus without complications: Secondary | ICD-10-CM | POA: Insufficient documentation

## 2024-02-16 DIAGNOSIS — R42 Dizziness and giddiness: Secondary | ICD-10-CM | POA: Diagnosis present

## 2024-02-16 DIAGNOSIS — F0390 Unspecified dementia without behavioral disturbance: Secondary | ICD-10-CM | POA: Diagnosis not present

## 2024-02-16 DIAGNOSIS — I959 Hypotension, unspecified: Secondary | ICD-10-CM

## 2024-02-16 DIAGNOSIS — I9589 Other hypotension: Secondary | ICD-10-CM | POA: Diagnosis not present

## 2024-02-16 DIAGNOSIS — I10 Essential (primary) hypertension: Secondary | ICD-10-CM | POA: Diagnosis not present

## 2024-02-16 LAB — CBC WITH DIFFERENTIAL/PLATELET
Abs Immature Granulocytes: 0.02 K/uL (ref 0.00–0.07)
Basophils Absolute: 0 K/uL (ref 0.0–0.1)
Basophils Relative: 1 %
Eosinophils Absolute: 0.1 K/uL (ref 0.0–0.5)
Eosinophils Relative: 2 %
HCT: 40 % (ref 36.0–46.0)
Hemoglobin: 13.2 g/dL (ref 12.0–15.0)
Immature Granulocytes: 0 %
Lymphocytes Relative: 24 %
Lymphs Abs: 1.5 K/uL (ref 0.7–4.0)
MCH: 29.9 pg (ref 26.0–34.0)
MCHC: 33 g/dL (ref 30.0–36.0)
MCV: 90.5 fL (ref 80.0–100.0)
Monocytes Absolute: 0.5 K/uL (ref 0.1–1.0)
Monocytes Relative: 8 %
Neutro Abs: 4 K/uL (ref 1.7–7.7)
Neutrophils Relative %: 65 %
Platelets: 241 K/uL (ref 150–400)
RBC: 4.42 MIL/uL (ref 3.87–5.11)
RDW: 13.4 % (ref 11.5–15.5)
WBC: 6.1 K/uL (ref 4.0–10.5)
nRBC: 0 % (ref 0.0–0.2)

## 2024-02-16 LAB — HEPATIC FUNCTION PANEL
ALT: 12 U/L (ref 0–44)
AST: 20 U/L (ref 15–41)
Albumin: 3.5 g/dL (ref 3.5–5.0)
Alkaline Phosphatase: 60 U/L (ref 38–126)
Bilirubin, Direct: 0.1 mg/dL (ref 0.0–0.2)
Indirect Bilirubin: 0.8 mg/dL (ref 0.3–0.9)
Total Bilirubin: 0.9 mg/dL (ref 0.0–1.2)
Total Protein: 5.9 g/dL — ABNORMAL LOW (ref 6.5–8.1)

## 2024-02-16 LAB — URINALYSIS, ROUTINE W REFLEX MICROSCOPIC
Bilirubin Urine: NEGATIVE
Glucose, UA: NEGATIVE mg/dL
Hgb urine dipstick: NEGATIVE
Ketones, ur: NEGATIVE mg/dL
Leukocytes,Ua: NEGATIVE
Nitrite: NEGATIVE
Protein, ur: NEGATIVE mg/dL
Specific Gravity, Urine: 1.005 (ref 1.005–1.030)
pH: 6 (ref 5.0–8.0)

## 2024-02-16 LAB — BASIC METABOLIC PANEL WITH GFR
Anion gap: 8 (ref 5–15)
BUN: 30 mg/dL — ABNORMAL HIGH (ref 8–23)
CO2: 22 mmol/L (ref 22–32)
Calcium: 9 mg/dL (ref 8.9–10.3)
Chloride: 110 mmol/L (ref 98–111)
Creatinine, Ser: 1 mg/dL (ref 0.44–1.00)
GFR, Estimated: 56 mL/min — ABNORMAL LOW (ref >60)
Glucose, Bld: 155 mg/dL — ABNORMAL HIGH (ref 70–99)
Potassium: 3.7 mmol/L (ref 3.5–5.1)
Sodium: 140 mmol/L (ref 135–145)

## 2024-02-16 LAB — MAGNESIUM: Magnesium: 2.1 mg/dL (ref 1.7–2.4)

## 2024-02-16 MED ORDER — SODIUM CHLORIDE 0.9 % IV BOLUS
1000.0000 mL | Freq: Once | INTRAVENOUS | Status: AC
  Administered 2024-02-16: 1000 mL via INTRAVENOUS

## 2024-02-16 NOTE — Telephone Encounter (Signed)
 Daughter called in to say that the patient had to go to the ER today, because her bp had bottom out. Wanted to make Dr. Gollan aware. ER drs think they need to be change in medication. Please advise

## 2024-02-16 NOTE — ED Notes (Signed)
 RN called [facility] and gave update. [facility] informed that pt is on the way back.

## 2024-02-16 NOTE — Discharge Instructions (Addendum)
 You were seen in the emergency department for an episode of transient hypotension.  Workup today was reassuring which does not mean that nothing is wrong but whether you are safe to continue the workup as an outpatient.  Please do the following.  1) call your cardiologist and discuss your symptoms.  You would likely need medication adjustment of your blood pressure medications especially since your heart rate is not appropriately elevated when hypotensive  2) use slow transitions when going from laying to sitting to standing.  Likely you will need over 10 minutes between steps  3) make sure your primary care physician is updated regarding your ED visit and recommendations  4) please return if any acutely worsening symptoms or any other emergency  It was very nice meeting you and I wish you and your daughter the best of luck with everything. Rolland Moats, MD, PhD  RETURN PRECAUTIONS & AFTERCARE: (ENGLISH) RETURN PRECAUTIONS: Return immediately to the emergency department or see/call your doctor if you feel worse, weak or have changes in speech or vision, are short of breath, have fever, vomiting, pain, bleeding or dark stool, trouble urinating or any new issues. Return here or see/call your doctor if not improving as expected for your suspected condition. FOLLOW-UP CARE: Call your doctor and/or any doctors we referred you to for more advice and to make an appointment. Do this today, tomorrow or after the weekend. Some doctors only take PPO insurance so if you have HMO insurance you may want to contact your HMO or your regular doctor for referral to a specialist within your plan. Either way tell the doctor's office that it was a referral from the emergency department so you get the soonest possible appointment.  YOUR TEST RESULTS: Take result reports of any blood or urine tests, imaging tests and EKG's to your doctor and any referral doctor. Have any abnormal tests repeated. Your doctor or a referral  doctor can let you know when this should be done. Also make sure your doctor contacts this hospital to get any test results that are not currently available such as cultures or special tests for infection and final imaging reports, which are often not available at the time you leave the ER but which may list additional important findings that are not documented on the preliminary report. BLOOD PRESSURE: If your blood pressure was greater than 120/80 have your blood pressure rechecked within 1 to 2 weeks. MEDICATION SIDE EFFECTS: Do not drive, walk, bike, take the bus, etc. if you have received or are being prescribed any sedating medications such as those for pain or anxiety or certain antihistamines like Benadryl . If you have been give one of these here get a taxi home or have a friend drive you home. Ask your pharmacist to counsel you on potential side effects of any new medication

## 2024-02-16 NOTE — ED Provider Notes (Signed)
 [facility] Provider Note    Event Date/Time   First MD Initiated Contact with Patient 02/16/24 1203     (approximate)   History   Dizziness   HPI  Jill Parrish is a 83 y.o. female with a past medical history of dementia with cognitive impairment, elevated IRT, previous history of hepatocellular carcinoma, hypertension, type 2 diabetes, peripheral artery disease who presents from  [facility] with an episode of lightheadedness found to be hypotensive.  Patient took her morning medications which include amlodipine , carvidilol and telmisartan  this morning per usual (daughter make sure that they are taken correctly).  She subsequently worked with physical therapy and reported feeling lightheaded.  Staff at her facility took her blood pressure and it was in the systolic 80s.  EMS arrived and this was confirmed.  Blood pressure improved en route without intervention.  Patient denies any complaints including headache shortness of breath chest pain abdominal pain but does not answer every question appropriately.  Patient's daughter was at bedside and reports that this is the baseline mental status for the patient.  Patient's blood pressure medication is largely managed by her cardiologist Dr. Gollan.       Physical Exam   Triage Vital Signs: ED Triage Vitals  Encounter Vitals Group     BP 02/16/24 1205 131/61     Girls Systolic BP Percentile --      Girls Diastolic BP Percentile --      Boys Systolic BP Percentile --      Boys Diastolic BP Percentile --      Pulse Rate 02/16/24 1205 (!) 53     Resp 02/16/24 1205 14     Temp 02/16/24 1205 98.1 F (36.7 C)     Temp Source 02/16/24 1205 Oral     SpO2 02/16/24 1205 98 %     Weight 02/16/24 1206 188 lb (85.3 kg)     Height 02/16/24 1206 5' 5 (1.651 m)     Head Circumference --      Peak Flow --      Pain Score 02/16/24 1206 0     Pain Loc --      Pain Education --      Exclude from Growth Chart --      Most recent vital signs: Vitals:   02/16/24 1400 02/16/24 1430  BP: (!) 147/65 139/67  Pulse: (!) 51 63  Resp: 15 16  Temp:    SpO2: 99% 98%    Nursing Triage Note reviewed. Vital signs reviewed and patients oxygen saturation is normoxic  General: Patient is well nourished, well developed, awake and alert, resting comfortably in no acute distress Head: Normocephalic and atraumatic Eyes: Normal inspection, extraocular muscles intact, no conjunctival pallor Ear, nose, throat: Normal external exam Neck: Normal range of motion Respiratory: Patient is in no respiratory distress, lungs CTAB Cardiovascular: Patient is not tachycardic, RRR without murmur appreciated GI: Abd SNT with no guarding or rebound  Back: Normal inspection of the back with good strength and range of motion throughout all ext Extremities: pulses intact with good cap refills, no LE pitting edema or calf tenderness Neuro: The patient is alert and oriented to person, not to place or time, easily confused with 5/5 bilat UE/LE strength, no gross motor or sensory defects noted. Coordination appears to be adequate. Skin: Warm, dry, and intact Psych: normal mood and affect, no SI or HI    ED Results / Procedures / Treatments   Labs (  all labs ordered are listed, but only abnormal results are displayed) Labs Reviewed  BASIC METABOLIC PANEL WITH GFR - Abnormal; Notable for the following components:      Result Value   Glucose, Bld 155 (*)    BUN 30 (*)    GFR, Estimated 56 (*)    All other components within normal limits  HEPATIC FUNCTION PANEL - Abnormal; Notable for the following components:   Total Protein 5.9 (*)    All other components within normal limits  URINALYSIS, ROUTINE W REFLEX MICROSCOPIC - Abnormal; Notable for the following components:   Color, Urine YELLOW (*)    APPearance HAZY (*)    All other components within normal limits  CBC WITH DIFFERENTIAL/PLATELET  MAGNESIUM     EKG  EKG and  rhythm strip are interpreted by myself at: 12:06 7/15 EKG: [bradycardic sinus rhythm] at heart rate of 61, normal QRS duration, QTc 445, normal ST segments and T waves?no ectopy? EKG not consistent with Acute STEMI Rhythm Strip: Bradycardic sinus rhythm    RADIOLOGY     PROCEDURES:  Critical Care performed: No  Procedures   MEDICATIONS ORDERED IN ED: Medications  sodium chloride  0.9 % bolus 1,000 mL (0 mLs Intravenous Stopped 02/16/24 1454)     IMPRESSION / MDM / ASSESSMENT AND PLAN / ED COURSE  I reviewed the triage vital signs and the nursing notes.                              Differential diagnosis includes, but is not limited to, medication effect, orthostasis, electrolyte derangement anemia, UTI  ED course: Patient is well-appearing and without any focal neurological deficits.  She is not anemic without any leukocytosis or profound electrolyte derangements.  Urinalysis not consistent with UTI.  EKG demonstrated no arrhythmia.  Patient's blood pressure has been in appropriate range while being here and she has been asymptomatic.  I did provide 1 L of IV fluid and patient was able to ambulate around the unit without any symptoms.  Patient and daughter feel comfortable returning home and following up with her cardiologist for possible medication adjustment.  They will return with any acutely worsening symptoms or any other emergency.  At time of discharge there is no evidence of acute life, limb, vision, or fertility threat. Patient has stable vital signs, pain is well controlled, patient is ambulatory and p.o. tolerant.  Discharge instructions were completed using the [employer] system. I would refer you to those at this time. All warnings prescriptions follow-up etc. were discussed in detail with the patient. Patient indicates understanding and is agreeable with this plan. All questions answered.  Patient is made aware that they may return to the emergency department for any  worsening or new condition or for any other emergency.   Patient's presentation is most consistent with exacerbation of chronic illness.  Summary:   Differential Diagnosis including by not limited to:   Risk: 5 This patient has a high risk of morbidity due to further diagnostic testing or treatment. Rationale: This patient's evaluation and management involve a high risk of morbidity due to the potential severity of presenting symptoms, need for diagnostic testing, and/or initiation of treatment that may require close monitoring. The differential includes conditions with potential for significant deterioration or requiring escalation of care. Treatment decisions in the ED, including medication administration, procedural interventions, or disposition planning, reflect this level of risk. Additional Support: -- Drug therapy requiring intensive  monitoring for toxicity [ ]  -- Decision regarding elective major surgery with idenitified patient or procedure risk factors [ ]  -- Decision regarding hospitalization or escalation of hospital-level care [ ]  -- Decision not to resuscitate or to de-escalate care because of poor prognosis [ ]  -- Parental controlled substances [ ]   COPA: 5 The patient has a severe exacerbation, progression, or side effect of treatment of the following illness/illnesses: []  OR  The patient has the following acute or chronic illness/injury that poses a possible threat to life or bodily function: [X] : The patient has a potentially serious acute condition or an acute exacerbation of a chronic illness requiring urgent evaluation and management in the Emergency Department. The clinical presentation necessitates immediate consideration of life-threatening or function-threatening diagnoses, even if they are ultimately ruled out.  Data(2/3 categories following were performed): 5 I reviewed or ordered at least three unique tests, external notes, and/or the history required an  independent historian as one of the three requirements as following: CBC, BMP, urinalysis, patient's daughter AND  I independently interpreted the following test: []  OR  I discussed the management of the patient with the following external physician or qualified healthcare provider: EMS and [facility] staff (via phone)    Suggested E/M Coding Level: 5, [phone], This has been selected based on the 03-13-2022 CPT guidelines for E/M codes in the Emergency Department based on 2/3 of the CoPA, Data, and Risk.   FINAL CLINICAL IMPRESSION(S) / ED DIAGNOSES   Final diagnoses:  Transient hypotension     Rx / DC Orders   ED Discharge Orders     None        Note:  This document was prepared using Dragon voice recognition software and may include unintentional dictation errors.   Nicholaus Rolland BRAVO, MD 02/16/24 [phone]

## 2024-02-16 NOTE — Telephone Encounter (Signed)
 Returned call to pt's daughter, Sonny Haddock; no answer; left a message to let her know that I had forward her message to Dr. Timothy Gollan regarding pt's recent visit to ED.  Our office will update them if Dr. Gollan makes any changes to her medications or want's to see pt in clinic.

## 2024-02-16 NOTE — ED Triage Notes (Signed)
 Pt to ED via [facility] from [facility]. Pt began experiencing dizziness after she took her morning medications. Pt lives alone and has hx of dementia and alzheimer's. Pt was doing PT at facility when dizziness began. PT took BP and pt was hypotensive at 70s systolic. EMS reports 78/43 when pt was standing and   113/49 when pt was laying.   EMS vitals CBG 149 HR 63 BP 113/49

## 2024-02-17 ENCOUNTER — Emergency Department

## 2024-02-17 ENCOUNTER — Emergency Department
Admission: EM | Admit: 2024-02-17 | Discharge: 2024-02-17 | Disposition: A | Discharge status: Home / Self Care | Attending: Emergency Medicine | Admitting: Emergency Medicine

## 2024-02-17 ENCOUNTER — Other Ambulatory Visit: Payer: Self-pay

## 2024-02-17 DIAGNOSIS — R0602 Shortness of breath: Secondary | ICD-10-CM | POA: Diagnosis not present

## 2024-02-17 DIAGNOSIS — R531 Weakness: Secondary | ICD-10-CM | POA: Diagnosis not present

## 2024-02-17 DIAGNOSIS — I10 Essential (primary) hypertension: Secondary | ICD-10-CM | POA: Diagnosis not present

## 2024-02-17 DIAGNOSIS — F0390 Unspecified dementia without behavioral disturbance: Secondary | ICD-10-CM | POA: Insufficient documentation

## 2024-02-17 DIAGNOSIS — R031 Nonspecific low blood-pressure reading: Secondary | ICD-10-CM | POA: Diagnosis present

## 2024-02-17 DIAGNOSIS — R7989 Other specified abnormal findings of blood chemistry: Secondary | ICD-10-CM | POA: Insufficient documentation

## 2024-02-17 DIAGNOSIS — E119 Type 2 diabetes mellitus without complications: Secondary | ICD-10-CM | POA: Insufficient documentation

## 2024-02-17 LAB — BASIC METABOLIC PANEL WITH GFR
Anion gap: 7 (ref 5–15)
BUN: 24 mg/dL — ABNORMAL HIGH (ref 8–23)
CO2: 26 mmol/L (ref 22–32)
Calcium: 8.8 mg/dL — ABNORMAL LOW (ref 8.9–10.3)
Chloride: 110 mmol/L (ref 98–111)
Creatinine, Ser: 0.95 mg/dL (ref 0.44–1.00)
GFR, Estimated: 59 mL/min — ABNORMAL LOW (ref >60)
Glucose, Bld: 100 mg/dL — ABNORMAL HIGH (ref 70–99)
Potassium: 3.8 mmol/L (ref 3.5–5.1)
Sodium: 143 mmol/L (ref 135–145)

## 2024-02-17 LAB — BRAIN NATRIURETIC PEPTIDE: B Natriuretic Peptide: 109.8 pg/mL — ABNORMAL HIGH (ref 0.0–100.0)

## 2024-02-17 LAB — RESP PANEL BY RT-PCR (RSV, FLU A&B, COVID)  RVPGX2
Influenza A by PCR: NEGATIVE
Influenza B by PCR: NEGATIVE
Resp Syncytial Virus by PCR: NEGATIVE
SARS Coronavirus 2 by RT PCR: NEGATIVE

## 2024-02-17 LAB — CBC
HCT: 41.4 % (ref 36.0–46.0)
Hemoglobin: 13.7 g/dL (ref 12.0–15.0)
MCH: 30.4 pg (ref 26.0–34.0)
MCHC: 33.1 g/dL (ref 30.0–36.0)
MCV: 91.8 fL (ref 80.0–100.0)
Platelets: 251 K/uL (ref 150–400)
RBC: 4.51 MIL/uL (ref 3.87–5.11)
RDW: 13.4 % (ref 11.5–15.5)
WBC: 7.3 K/uL (ref 4.0–10.5)
nRBC: 0 % (ref 0.0–0.2)

## 2024-02-17 LAB — TROPONIN I (HIGH SENSITIVITY)
Troponin I (High Sensitivity): 9 ng/L (ref <18)
Troponin I (High Sensitivity): 13 ng/L (ref <18)

## 2024-02-17 NOTE — Telephone Encounter (Signed)
 Per pt's daughter pt went back to ED today. She wants to talk to a nurse.

## 2024-02-17 NOTE — ED Triage Notes (Signed)
 Pt to ED via [facility] from [facility]. Facility reports hypotension with SBP in the 80s. Pt was seen here yesterday for same. EMS BP 150/80. Daughter reports pt appears pale. Pt is on multiple BP meds.

## 2024-02-17 NOTE — ED Triage Notes (Signed)
 Arrives via [facility] from [facility]. C/O low blood pressure.  EMS reports 150/80 75 CBG:  100   Patient with history of Dementia.  Patient without complaint. Daughter on way to ED.

## 2024-02-17 NOTE — ED Provider Notes (Signed)
 [facility] Provider Note    Event Date/Time   First MD Initiated Contact with Patient 02/17/24 [id number]     (approximate)   History   Hypotension (/)   HPI  Jill Parrish is a 83 y.o. female with dementia with cognitive impairment, hepatocellular carcinoma, hypertension, diabetes who comes in from [facility] due to low blood pressure.  I reviewed the notes were patient was actually seen here yesterday for low blood pressure.  UA without evidence of UTI patient got 1 L of fluids and felt better therefore was discharged home is felt that it could be secondary to patient's medications.   Patient is on amlodipine  5 mg, hydrochlorothiazide  12.5 telmisartan  80 mg  Concern from [facility] was that her blood pressure was in the 80s but when EMS got that her blood pressure was 150/80  According to family they have noticed increasing swelling in her legs she reportedly per the nursing staff had some discolored lips, she looked pale and was not acting her normal self.  When her blood pressure was low.  She did not fall down did not hit her head she is otherwise back in her normal self now.  They also do report that her she was recently increased on her Seroquel.  They are not sure if this could be related to her heart or if this is just related to her medications.   Physical Exam   Triage Vital Signs: ED Triage Vitals  Encounter Vitals Group     BP 02/17/24 1312 (!) 140/78     Girls Systolic BP Percentile --      Girls Diastolic BP Percentile --      Boys Systolic BP Percentile --      Boys Diastolic BP Percentile --      Pulse Rate 02/17/24 1312 60     Resp 02/17/24 1312 17     Temp 02/17/24 1312 98.2 F (36.8 C)     Temp Source 02/17/24 1312 Oral     SpO2 02/17/24 1312 97 %     Weight --      Height --      Head Circumference --      Peak Flow --      Pain Score 02/17/24 1313 0     Pain Loc --      Pain Education --      Exclude from Growth Chart --      Most recent vital signs: Vitals:   02/17/24 1312  BP: (!) 140/78  Pulse: 60  Resp: 17  Temp: 98.2 F (36.8 C)  SpO2: 97%     General: Awake, no distress.  CV:  Good peripheral perfusion.  Resp:  Normal effort.  Abd:  No distention.  Other:  Cranial nerves II through XII are intact.  Equal strength in arms and legs.  Abdomen soft and nontender.  Patient is alert and oriented x 2 when I ask her where she is located at she states the place that can help people when they are sick   ED Results / Procedures / Treatments   Labs (all labs ordered are listed, but only abnormal results are displayed) Labs Reviewed  BASIC METABOLIC PANEL WITH GFR - Abnormal; Notable for the following components:      Result Value   Glucose, Bld 100 (*)    BUN 24 (*)    Calcium  8.8 (*)    GFR, Estimated 59 (*)    All other  components within normal limits  BRAIN NATRIURETIC PEPTIDE - Abnormal; Notable for the following components:   B Natriuretic Peptide 109.8 (*)    All other components within normal limits  RESP PANEL BY RT-PCR (RSV, FLU A&B, COVID)  RVPGX2  CBC  TROPONIN I (HIGH SENSITIVITY)  TROPONIN I (HIGH SENSITIVITY)     EKG  My interpretation of EKG:  Sinus bradycardia rate of 53 without any ST elevation or T wave inversions, normal intervals  RADIOLOGY I have reviewed the xray personally and interpreted no pneumonia   PROCEDURES:  Critical Care performed: No  Procedures   MEDICATIONS ORDERED IN ED: Medications - No data to display   IMPRESSION / MDM / ASSESSMENT AND PLAN / ED COURSE  I reviewed the triage vital signs and the nursing notes.   Patient's presentation is most consistent with acute presentation with potential threat to life or bodily function.   I suspect symptoms are related to low blood pressure in the setting of potentially her medications.  She had stopped taking the hydrochlorothiazide  this morning but they do report that they have just increased  her Seroquel to 50 and this has happened both times about an hour and a half after taking this increased dose so could be related to this as well.  However after discussion with family about goals of care they would like to proceed with workup to ensure her heart was okay ensure no other acute pathology.  She denies any recent falls or hitting her head and there is no hematoma to the head and witnesses did not see any signs of head trauma.  BMP is reassuring with normal creatinine CBC shows no white count to suggest infection no anemia Reviewed blood work from yesterday where her hepatic function was normal.  Mag normal UA negative DVT ultrasounds are negative Chest x-ray negative Troponins are negative x 2.  COVID flu is negative, BNP only slightly elevated and no edema on chest x-ray.  Patient's blood pressures now are elevated but every time the blood pressure goes off she starts screaming in pain and states that it hurts when is going off so not sure how accurate these are.  Discussed with family that reducing her Seroquel back down to 25 continue to hold the hydrochlorothiazide  and can consider doing the amlodipine  in the afternoon so she is not getting both blood pressure medications in the morning.  Only give the amlodipine  in the afternoon if blood pressures are over 140 systolic.  They expressed understanding felt comfortable with this plan and will follow-up with her PCP  Consider admission for patient but given reassuring workup patient can be discharged home with close follow-up with PCP   FINAL CLINICAL IMPRESSION(S) / ED DIAGNOSES   Final diagnoses:  Low blood pressure reading  Weakness     Rx / DC Orders   ED Discharge Orders     None        Note:  This document was prepared using Dragon voice recognition software and may include unintentional dictation errors.   Ernest Ronal BRAVO, MD 02/17/24 [phone]

## 2024-02-17 NOTE — ED Notes (Signed)
 ED Provider at bedside.

## 2024-02-17 NOTE — Discharge Instructions (Signed)
 Continue to hold the hydrochlorothiazide .  Take the telmisartan  in the morning.  In the afternoon if her blood pressure is still elevated over 140 systolic take the amlodipine   Go back down to Seroquel 25 mg  Call your primary care doctor to make a follow-up appointment return to the ER if develop worsening symptoms or any other concerns

## 2024-02-17 NOTE — Telephone Encounter (Signed)
 Called and spoke with the patient's daughter regarding the patient being readmitted to ED again today for hypotension.  Daughter said patient's blood pressure at home was 80/62, but higher when the patient got to the ED.  Suggested that there may be something wrong with the blood pressure cuff.  I suggested that they bring the blood pressure cuff with them at their next office visit w/Dr. Gollan on 7/25 and we will be glad to check for accuracy and measure it against our cuffs.  Also talked about possibly adjusting the patient's medications.  Daughter stated that patient was recently put on Seroquel for agitation and that may have played a role in the hypotension.  This RN suggested that will forward the conversation to Dr. Gollan and his his nurse for any recommendations.  Patient's daughter verbalized understanding and expressed gratitude for the call.

## 2024-02-22 NOTE — Telephone Encounter (Signed)
 Called and left a message for call back

## 2024-02-25 NOTE — Progress Notes (Unsigned)
 Cardiology Office Note  Date:  02/26/2024   ID:  Jill Parrish, Jill Parrish Jun 26, 1941, MRN [id number]  PCP:  Diedra Lame, MD   Chief Complaint  Patient presents with   [facility] ER follow up     Doing well.     HPI:  82-old woman with history of  Chest pain syndrome,  March 2004 Felt secondary to GERD,  normal Myoview  treadmill stress test and echocardiogram.  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  MRI brain and ultrasound carotids, echocardiogram  Hepatocellular carcinoma, Status post partial right hepatectomy who presents for follow up of her chest pain symptoms  Last seen by myself in clinic 6/23 Presents today with her daughter In general feels she is doing well Walks dog,Lives at [facility]  Seen in the ER July 15 UA without evidence of UTI, got 1 L fluids felt better  Seen in the emergency room February 17, 2024 hypotension Lives at [facility], noted to have low blood pressure By report blood pressure 80 at [facility] but when EMS got blood pressure 150/80 Increased leg swelling, discolored lips, looked pale, not acting herself Seroquel dosing was recently increased In the ER blood pressure 140/78 ER workup otherwise normal It was recommended Seroquel dose back down to 25, hold hydrochlorothiazide  12.5 Other medications include amlodipine  5, telmisartan  80  In follow-up today daughter reports that she is off Seroquel and amlodipine  They have arranged new nursing help at home 4 hours in the morning 4 hours in the evening  Overall she has been feeling well Daughter concerned that she looks pale Hemoglobin stable 13 No regular exercise program, walks with a walker  Trace lower extremity edema, does not like wearing compression hose  Lab work reviewed Total cholesterol 140 LDL 62 A1c 6.3 Creatinine  1.1 Normal CBC  EKG personally reviewed by myself on todays visit EKG Interpretation Date/Time:  Friday February 26 2024 10:49:30 EDT Ventricular Rate:  79 PR Interval:  156 QRS Duration:  70 QT Interval:  406 QTC Calculation: 465 R Axis:   -5  Text Interpretation: Sinus rhythm with Premature atrial complexes When compared with ECG of 17-Feb-2024 16:44, No significant change was found Confirmed by Perla Lye [phone]) on 02/26/2024 11:01:47 AM   Other past medical history reviewed Prior history includes robotic assisted partial hepatectomy and en bloc cholecystectomy on 05/26/2018.  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   MRI scan February 14, 2020 reviewed on today's visit Status post partial right hepatectomy without recurrence No new liver lesions or abdominal metastasis Benign left adrenal adenoma  Weight down 40 pounds in 2 years 2019-2020,  No recent carotid ultrasound available, last in May 2018 Most recent ultrasound 2020 39% disease bilaterally   PMH:   has a past medical history of Anxiety disorder, Dementia (HCC), Diabetes mellitus without complication (HCC) (09/29/2017), Dyslipidemia, GERD (gastroesophageal reflux disease), Hepatocellular carcinoma (HCC) (04/07/2018), Hyperlipidemia, Hypertension, Migraine headache, Migraine headache with aura, Pneumonia, Stroke (HCC), and TIA (transient ischemic attack) (12/18/2012).  PSH:    Past Surgical History:  Procedure Laterality Date   ABDOMINAL HYSTERECTOMY     CATARACT EXTRACTION Bilateral    KNEE SURGERY Right 2005   NASAL SINUS SURGERY     OVARIAN CYST REMOVAL     PARTIAL HYSTERECTOMY  1985   ROTATOR CUFF REPAIR Right 2007  arthroscopy   TONSILLECTOMY  1947    Current Outpatient Medications  Medication Sig Dispense Refill   acetaminophen  (TYLENOL ) 500 MG tablet Take 500 mg by mouth every 6 (six) hours as needed for mild pain or headache.     ascorbic  acid (VITAMIN C) 500 MG tablet Take 1 tablet by mouth daily.     aspirin  EC 81 MG tablet Take 81 mg by mouth daily.     Cholecalciferol (VITAMIN D-1000 MAX ST) 25 MCG (1000 UT) tablet Take 3,000 Units by mouth daily.     diclofenac sodium (VOLTAREN) 1 % GEL Apply 2 g topically as needed (for pain).     EPINEPHrine  0.3 mg/0.3 mL IJ SOAJ injection Inject into the muscle.     ezetimibe  (ZETIA ) 10 MG tablet Take 1 tablet (10 mg total) by mouth daily. 30 tablet 11   hydrochlorothiazide  (MICROZIDE ) 12.5 MG capsule Take 1 capsule (12.5 mg) by mouth once daily as needed for increased swelling 90 capsule 0   Misc Natural Products (HYPROST) CAPS Take 2 tablets by mouth in the morning and at bedtime.     Omega-3 1000 MG CAPS Take 1 capsule by mouth daily.     polyethylene glycol powder (GLYCOLAX /MIRALAX ) 17 GM/SCOOP powder Take 17 g by mouth daily. 507 g 1   SUMAtriptan (IMITREX) 100 MG tablet Take by mouth.     telmisartan  (MICARDIS ) 80 MG tablet TAKE 1 TABLET BY MOUTH DAILY.  NEEDS APPT WITH CARDIOLOGY FOR FUTURE REFILLS 15 tablet 0   Zinc 10 MG LOZG Take by mouth.     No current facility-administered medications for this visit.    Allergies:   Erythromycin base, Minocin [minocycline hcl], Aspirin , Atorvastatin , Cefprozil, Erythromycin, Fexofenadine, Loratadine, Minocycline, Sulfa antibiotics, Ciprofloxacin , and Keflex [cephalexin]   Social History:  The patient  reports that she has never smoked. She has never used smokeless tobacco. She reports current alcohol use. She reports that she does not use drugs.   Family History:   family history includes Aneurysm in her sister; Breast cancer (age of onset: 78) in her sister; Breast cancer (age of onset: 81) in her maternal aunt and paternal grandmother; Heart attack in her father; Heart disease in her paternal grandfather and paternal grandmother; Stroke in her maternal aunt, maternal grandfather, and mother.   Review of Systems: Review of Systems   Constitutional: Negative.   HENT: Negative.    Respiratory: Negative.    Cardiovascular: Negative.   Gastrointestinal: Negative.   Musculoskeletal: Negative.   Neurological: Negative.   Psychiatric/Behavioral: Negative.    All other systems reviewed and are negative.  PHYSICAL EXAM: VS:  BP 130/66 (BP Location: Left Arm, Patient Position: Sitting, Cuff Size: Normal)   Pulse 79   Ht 5' 6.5 (1.689 m)   Wt 173 lb (78.5 kg)   SpO2 96%   BMI 27.50 kg/m  , BMI Body mass index is 27.5 kg/m. Constitutional:  oriented to person, place, and time. No distress.  HENT:  Head: Grossly normal Eyes:  no discharge. No scleral icterus.  Neck: No JVD, no carotid bruits  Cardiovascular: Regular rate and rhythm, no murmurs appreciated Pulmonary/Chest: Clear to auscultation bilaterally, no wheezes or rails Abdominal: Soft.  no distension.  no tenderness.  Musculoskeletal: Normal range of motion Neurological:  normal muscle tone. Coordination normal. No atrophy Skin: Skin warm and dry Psychiatric: normal affect, pleasant  Recent Labs: 02/16/2024: ALT 12; Magnesium 2.1 02/17/2024: B Natriuretic Peptide 109.8; BUN 24; Creatinine, Ser 0.95; Hemoglobin 13.7; Platelets  251; Potassium 3.8; Sodium 143    Lipid Panel Lab Results  Component Value Date   CHOL 240 (H) 12/18/2012   HDL 58 12/18/2012   LDLCALC 137 (H) 12/18/2012   TRIG 224 (H) 12/18/2012      Wt Readings from Last 3 Encounters:  02/26/24 173 lb (78.5 kg)  02/16/24 188 lb (85.3 kg)  11/28/23 158 lb (71.7 kg)       ASSESSMENT AND PLAN:  Chest pain, unspecified type -  Normal stress test 12/2016 Continue Zetia  Currently with no symptoms of angina. No further workup at this time. Continue current medication regimen.  Essential hypertension -  Recommend she continue to hold amlodipine , continue telmisartan  80 daily, HCTZ Would check blood pressures at home, if numbers run low may need to cut down losartan down to 40 daily -If  blood pressure runs high may need to restart low-dose amlodipine  in the p.m.  Cerebrovascular accident (CVA), unspecified mechanism (HCC)  aspirin  81 mg daily Mild bilateral carotid disease on ultrasound 2018  ultrasound less than 39% disease bilaterally Continue Zetia   Carotid stenosis/PAD Less than 39% disease bilaterally, monitored by vascular surgery, last 2018 No significant bruits on exam  Mixed hyperlipidemia Previously on Crestor   but fell off her list Recommend she stay on Zetia  alone for now   Diet-controlled diabetes No recent A1c available, last was 6.3 two years ago Managed by primary care Weight stable  Leg edema Dependent edema, recommended leg elevation when sitting, compression hose if tolerated  Dementia Stopped Seroquel for agitation Was on 20 5 in the morning then titrated up to 50 mg in the morning but had potential side effects of low blood pressure seen in the ER -Recommended talk with primary care, potentially could try 12.5 twice daily    Orders Placed This Encounter  Procedures   EKG 12-Lead     Signed, Velinda Lunger, M.D., Ph.D. 02/26/2024  [facility] [place], [place] [phone]

## 2024-02-26 ENCOUNTER — Ambulatory Visit: Attending: Cardiovascular Disease | Admitting: Cardiovascular Disease

## 2024-02-26 ENCOUNTER — Encounter: Payer: Self-pay | Admitting: Cardiovascular Disease

## 2024-02-26 VITALS — BP 130/66 | HR 79 | Ht 66.5 in | Wt 173.0 lb

## 2024-02-26 DIAGNOSIS — I10 Essential (primary) hypertension: Secondary | ICD-10-CM | POA: Diagnosis not present

## 2024-02-26 DIAGNOSIS — I872 Venous insufficiency (chronic) (peripheral): Secondary | ICD-10-CM

## 2024-02-26 DIAGNOSIS — I739 Peripheral vascular disease, unspecified: Secondary | ICD-10-CM | POA: Diagnosis not present

## 2024-02-26 DIAGNOSIS — I6522 Occlusion and stenosis of left carotid artery: Secondary | ICD-10-CM

## 2024-02-26 DIAGNOSIS — E7800 Pure hypercholesterolemia, unspecified: Secondary | ICD-10-CM

## 2024-02-26 DIAGNOSIS — I639 Cerebral infarction, unspecified: Secondary | ICD-10-CM

## 2024-02-26 DIAGNOSIS — E782 Mixed hyperlipidemia: Secondary | ICD-10-CM

## 2024-02-26 NOTE — Patient Instructions (Addendum)
 Monitor blood pressure If high, we could add back little bit of amlodipine  in the evening  If low, we could cut the telmisartan  in 1/2 (40 mg)  Medication Instructions:  No changes  If you need a refill on your cardiac medications before your next appointment, please call your pharmacy.   Lab work: No new labs needed  Testing/Procedures: No new testing needed  Follow-Up: At [facility], you and your health needs are our priority.  As part of our continuing mission to provide you with exceptional heart care, we have created designated Provider Care Teams.  These Care Teams include your primary Cardiologist (physician) and Advanced Practice Providers (APPs -  Physician Assistants and Nurse Practitioners) who all work together to provide you with the care you need, when you need it.  You will need a follow up appointment in 12 months  Providers on your designated Care Team:   Lonni Meager, NP Bernardino Bring, PA-C Cadence [place], [place]  COVID-19 Vaccine Information can be found at: [email or web address] For questions related to vaccine distribution or appointments, please email [email or web address] or call [phone].

## 2024-02-26 NOTE — Telephone Encounter (Signed)
 Patient seen in clinic all questions and concerns addressed at that time.

## 2024-04-28 ENCOUNTER — Emergency Department

## 2024-04-28 ENCOUNTER — Encounter: Payer: Self-pay | Admitting: Emergency Medicine

## 2024-04-28 ENCOUNTER — Emergency Department
Admission: EM | Admit: 2024-04-28 | Discharge: 2024-04-28 | Disposition: A | Discharge status: Home / Self Care | Attending: Emergency Medicine | Admitting: Emergency Medicine

## 2024-04-28 ENCOUNTER — Other Ambulatory Visit: Payer: Self-pay

## 2024-04-28 DIAGNOSIS — G309 Alzheimer's disease, unspecified: Secondary | ICD-10-CM | POA: Diagnosis not present

## 2024-04-28 DIAGNOSIS — N3000 Acute cystitis without hematuria: Secondary | ICD-10-CM | POA: Insufficient documentation

## 2024-04-28 DIAGNOSIS — S0990XA Unspecified injury of head, initial encounter: Secondary | ICD-10-CM | POA: Insufficient documentation

## 2024-04-28 DIAGNOSIS — W19XXXA Unspecified fall, initial encounter: Secondary | ICD-10-CM | POA: Insufficient documentation

## 2024-04-28 DIAGNOSIS — I10 Essential (primary) hypertension: Secondary | ICD-10-CM | POA: Diagnosis not present

## 2024-04-28 DIAGNOSIS — M25562 Pain in left knee: Secondary | ICD-10-CM | POA: Insufficient documentation

## 2024-04-28 DIAGNOSIS — E119 Type 2 diabetes mellitus without complications: Secondary | ICD-10-CM | POA: Diagnosis not present

## 2024-04-28 DIAGNOSIS — Y92009 Unspecified place in unspecified non-institutional (private) residence as the place of occurrence of the external cause: Secondary | ICD-10-CM | POA: Diagnosis not present

## 2024-04-28 LAB — URINALYSIS, ROUTINE W REFLEX MICROSCOPIC
Bilirubin Urine: NEGATIVE
Glucose, UA: NEGATIVE mg/dL
Hgb urine dipstick: NEGATIVE
Ketones, ur: 5 mg/dL — AB
Leukocytes,Ua: NEGATIVE
Nitrite: POSITIVE — AB
Protein, ur: NEGATIVE mg/dL
Specific Gravity, Urine: 1.017 (ref 1.005–1.030)
pH: 7 (ref 5.0–8.0)

## 2024-04-28 LAB — CBC WITH DIFFERENTIAL/PLATELET
Abs Immature Granulocytes: 0.03 K/uL (ref 0.00–0.07)
Basophils Absolute: 0 K/uL (ref 0.0–0.1)
Basophils Relative: 1 %
Eosinophils Absolute: 0.1 K/uL (ref 0.0–0.5)
Eosinophils Relative: 2 %
HCT: 39.4 % (ref 36.0–46.0)
Hemoglobin: 13.6 g/dL (ref 12.0–15.0)
Immature Granulocytes: 0 %
Lymphocytes Relative: 32 %
Lymphs Abs: 2.3 K/uL (ref 0.7–4.0)
MCH: 30.7 pg (ref 26.0–34.0)
MCHC: 34.5 g/dL (ref 30.0–36.0)
MCV: 88.9 fL (ref 80.0–100.0)
Monocytes Absolute: 0.6 K/uL (ref 0.1–1.0)
Monocytes Relative: 8 %
Neutro Abs: 4.2 K/uL (ref 1.7–7.7)
Neutrophils Relative %: 57 %
Platelets: 305 K/uL (ref 150–400)
RBC: 4.43 MIL/uL (ref 3.87–5.11)
RDW: 12.6 % (ref 11.5–15.5)
WBC: 7.2 K/uL (ref 4.0–10.5)
nRBC: 0 % (ref 0.0–0.2)

## 2024-04-28 LAB — COMPREHENSIVE METABOLIC PANEL WITH GFR
ALT: 13 U/L (ref 0–44)
AST: 19 U/L (ref 15–41)
Albumin: 3.7 g/dL (ref 3.5–5.0)
Alkaline Phosphatase: 63 U/L (ref 38–126)
Anion gap: 11 (ref 5–15)
BUN: 22 mg/dL (ref 8–23)
CO2: 22 mmol/L (ref 22–32)
Calcium: 8.9 mg/dL (ref 8.9–10.3)
Chloride: 109 mmol/L (ref 98–111)
Creatinine, Ser: 0.85 mg/dL (ref 0.44–1.00)
GFR, Estimated: >60 mL/min (ref >60)
Glucose, Bld: 108 mg/dL — ABNORMAL HIGH (ref 70–99)
Potassium: 3.6 mmol/L (ref 3.5–5.1)
Sodium: 142 mmol/L (ref 135–145)
Total Bilirubin: 0.9 mg/dL (ref 0.0–1.2)
Total Protein: 6.2 g/dL — ABNORMAL LOW (ref 6.5–8.1)

## 2024-04-28 LAB — TROPONIN I (HIGH SENSITIVITY): Troponin I (High Sensitivity): 9 ng/L (ref <18)

## 2024-04-28 MED ORDER — NITROFURANTOIN MONOHYD MACRO 100 MG PO CAPS
100.0000 mg | ORAL_CAPSULE | Freq: Once | ORAL | Status: DC
  Filled 2024-04-28: qty 1

## 2024-04-28 MED ORDER — NITROFURANTOIN MONOHYD MACRO 100 MG PO CAPS: 100.0000 mg | ORAL_CAPSULE | Freq: Two times a day (BID) | ORAL | 0 refills | Status: AC

## 2024-04-28 NOTE — ED Notes (Signed)
 At this time this EDT helped pt get into clean briefs and linen after soiling her own. Pt ambulated with minimal assist out of bed and to the bathroom, pt could not use the bathroom at the moment. Pt is now back in bed, clean and dry, daughter at bedside and bed alarm on.

## 2024-04-28 NOTE — ED Provider Notes (Signed)
 [facility] Provider Note    Event Date/Time   First MD Initiated Contact with Patient 04/28/24 1405     (approximate)   History   Fall (/)   HPI  Jill Parrish is a 83 y.o. female with history of hypertension, CVA, GERD, PAD, diabetes and as listed in EMR presents to the emergency department for treatment and evaluation after an unwitnessed fall at her care home this afternoon.  Patient was found on her knees on the ground.  She has Alzheimer's and is unable to report reason or how she fell.  Patient complains of left knee pain.Jill Parrish     Physical Exam    Vitals:   04/28/24 1409  BP: (!) 167/96  Pulse: 61  Resp: 18  Temp: 98.1 F (36.7 C)  SpO2: 98%    General: Awake, no distress.  CV:  Good peripheral perfusion.  Resp:  Normal effort.  Abd:  No distention.  Other:  No palpable scalp hematomas and no open wounds noted.  No skin tear lacerations noted to exposed areas on upper or lower extremities.  Patient complains of pain with passive flexion of left knee.  No complaint of pain with internal or external rotation of either hip.  No obvious ankle injury and no expressed pain with passive range of motion.   ED Results / Procedures / Treatments   Labs (all labs ordered are listed, but only abnormal results are displayed)  Labs Reviewed  URINE CULTURE - Abnormal; Notable for the following components:      Result Value   Culture   (*)    Value: >=100,000 COLONIES/mL GRAM NEGATIVE RODS IDENTIFICATION AND SUSCEPTIBILITIES TO FOLLOW Performed at [facility], [address], [place], [place] [address]    All other components within normal limits  URINALYSIS, ROUTINE W REFLEX MICROSCOPIC - Abnormal; Notable for the following components:   Color, Urine YELLOW (*)    APPearance HAZY (*)    Ketones, ur 5 (*)    Nitrite POSITIVE (*)    Bacteria, UA FEW (*)    All other components within normal limits  COMPREHENSIVE METABOLIC PANEL WITH  GFR - Abnormal; Notable for the following components:   Glucose, Bld 108 (*)    Total Protein 6.2 (*)    All other components within normal limits  CBC WITH DIFFERENTIAL/PLATELET  TROPONIN I (HIGH SENSITIVITY)     EKG  Sinus bradycardia with a rate of 50.  PACs no longer present in comparison to previous.   RADIOLOGY  Image and radiology report reviewed and interpreted by me. Radiology report consistent with the same.  CT head and cervical spine images are both negative for acute concerns.  X-ray image of the left hip and left knee are negative for acute bony abnormality.  PROCEDURES:  Critical Care performed: No  Procedures   MEDICATIONS ORDERED IN ED:  Medications - No data to display   IMPRESSION / MDM / ASSESSMENT AND PLAN / ED COURSE   I have reviewed the triage note and vital signs. Vital signs stable   Differential diagnosis includes, but is not limited to, ICH, skull fracture, scalp hematoma, cervical vertebral injury, cervical strain, patella fracture, hip fracture, musculoskeletal pain, electrolyte disturbance, coronary event  Patient's presentation is most consistent with acute presentation with potential threat to life or bodily function.  83 year old female presenting to the emergency department for treatment and evaluation after unwitnessed fall this afternoon.  See HPI for further details.  Per report from EMS, patient has at her cognitive baseline.  She is unable to tell me what caused her fall.  Strong aroma of urine is noted.  Plan will be to get CT head, cervical spine, images of the left knee and hip, lab studies including urinalysis.  Daughter at bedside.  Results of images reviewed.  Awaiting remainder of lab studies and urinalysis.  CBC is normal, CMP is normal as well.  Initial troponin is 9.  Urinalysis is nitrate positive with white blood cells and bacteria.  Due to patient's many allergies, plan will be to treat with Macrobid .  Patient  discharged back to her facility in stable condition.  Outpatient follow-up and ER return precautions discussed with daughter.         FINAL CLINICAL IMPRESSION(S) / ED DIAGNOSES   Final diagnoses:  Acute cystitis without hematuria  Acute pain of left knee  Fall, initial encounter     Rx / DC Orders   ED Discharge Orders          Ordered    nitrofurantoin , macrocrystal-monohydrate, (MACROBID ) 100 MG capsule  2 times daily        04/28/24 1823             Note:  This document was prepared using Dragon voice recognition software and may include unintentional dictation errors.   Herlinda Kirk NOVAK, FNP 04/29/24 1943    Claudene Rover, MD 04/30/24 [phone]

## 2024-04-28 NOTE — Discharge Instructions (Signed)
 Follow-up with primary care for repeat urinalysis in about 10 days.  Return to the emergency department for symptoms that change, worsen, or for new concerns if you are unable to schedule an appointment.

## 2024-04-28 NOTE — ED Triage Notes (Signed)
 Presents via EMS from [facility]  Having pain to left knee and left hip

## 2024-04-30 LAB — URINE CULTURE: Culture: >=100000 — AB

## 2024-06-30 ENCOUNTER — Other Ambulatory Visit: Payer: Self-pay

## 2024-06-30 ENCOUNTER — Emergency Department

## 2024-06-30 ENCOUNTER — Observation Stay
Admission: EM | Admit: 2024-06-30 | Discharge: 2024-07-01 | Disposition: A | Discharge status: Home / Self Care | Attending: Emergency Medicine | Admitting: Emergency Medicine

## 2024-06-30 DIAGNOSIS — I509 Heart failure, unspecified: Secondary | ICD-10-CM | POA: Diagnosis not present

## 2024-06-30 DIAGNOSIS — M6281 Muscle weakness (generalized): Secondary | ICD-10-CM | POA: Insufficient documentation

## 2024-06-30 DIAGNOSIS — G459 Transient cerebral ischemic attack, unspecified: Secondary | ICD-10-CM | POA: Diagnosis not present

## 2024-06-30 DIAGNOSIS — F0390 Unspecified dementia without behavioral disturbance: Secondary | ICD-10-CM

## 2024-06-30 DIAGNOSIS — F32A Depression, unspecified: Secondary | ICD-10-CM | POA: Insufficient documentation

## 2024-06-30 DIAGNOSIS — E785 Hyperlipidemia, unspecified: Secondary | ICD-10-CM | POA: Insufficient documentation

## 2024-06-30 DIAGNOSIS — F1090 Alcohol use, unspecified, uncomplicated: Secondary | ICD-10-CM | POA: Diagnosis not present

## 2024-06-30 DIAGNOSIS — I639 Cerebral infarction, unspecified: Secondary | ICD-10-CM

## 2024-06-30 DIAGNOSIS — G43909 Migraine, unspecified, not intractable, without status migrainosus: Secondary | ICD-10-CM | POA: Insufficient documentation

## 2024-06-30 DIAGNOSIS — I110 Hypertensive heart disease with heart failure: Secondary | ICD-10-CM | POA: Diagnosis not present

## 2024-06-30 DIAGNOSIS — I5032 Chronic diastolic (congestive) heart failure: Secondary | ICD-10-CM | POA: Insufficient documentation

## 2024-06-30 DIAGNOSIS — E119 Type 2 diabetes mellitus without complications: Secondary | ICD-10-CM | POA: Insufficient documentation

## 2024-06-30 DIAGNOSIS — I10 Essential (primary) hypertension: Secondary | ICD-10-CM | POA: Diagnosis not present

## 2024-06-30 DIAGNOSIS — Z7982 Long term (current) use of aspirin: Secondary | ICD-10-CM | POA: Diagnosis not present

## 2024-06-30 DIAGNOSIS — R531 Weakness: Principal | ICD-10-CM

## 2024-06-30 DIAGNOSIS — Z79899 Other long term (current) drug therapy: Secondary | ICD-10-CM | POA: Diagnosis not present

## 2024-06-30 DIAGNOSIS — R262 Difficulty in walking, not elsewhere classified: Secondary | ICD-10-CM | POA: Insufficient documentation

## 2024-06-30 DIAGNOSIS — R471 Dysarthria and anarthria: Secondary | ICD-10-CM | POA: Insufficient documentation

## 2024-06-30 DIAGNOSIS — R29704 NIHSS score 4: Secondary | ICD-10-CM | POA: Diagnosis not present

## 2024-06-30 DIAGNOSIS — Z8673 Personal history of transient ischemic attack (TIA), and cerebral infarction without residual deficits: Secondary | ICD-10-CM | POA: Insufficient documentation

## 2024-06-30 DIAGNOSIS — F418 Other specified anxiety disorders: Secondary | ICD-10-CM | POA: Diagnosis present

## 2024-06-30 DIAGNOSIS — F419 Anxiety disorder, unspecified: Secondary | ICD-10-CM | POA: Insufficient documentation

## 2024-06-30 LAB — COMPREHENSIVE METABOLIC PANEL WITH GFR
ALT: 16 U/L (ref 0–44)
AST: 28 U/L (ref 15–41)
Albumin: 3.9 g/dL (ref 3.5–5.0)
Alkaline Phosphatase: 92 U/L (ref 38–126)
Anion gap: 11 (ref 5–15)
BUN: 15 mg/dL (ref 8–23)
CO2: 24 mmol/L (ref 22–32)
Calcium: 9.3 mg/dL (ref 8.9–10.3)
Chloride: 103 mmol/L (ref 98–111)
Creatinine, Ser: 0.92 mg/dL (ref 0.44–1.00)
GFR, Estimated: >60 mL/min (ref >60)
Glucose, Bld: 144 mg/dL — ABNORMAL HIGH (ref 70–99)
Potassium: 4.4 mmol/L (ref 3.5–5.1)
Sodium: 138 mmol/L (ref 135–145)
Total Bilirubin: 0.4 mg/dL (ref 0.0–1.2)
Total Protein: 6.6 g/dL (ref 6.5–8.1)

## 2024-06-30 LAB — CBC WITH DIFFERENTIAL/PLATELET
Abs Immature Granulocytes: 0.03 K/uL (ref 0.00–0.07)
Basophils Absolute: 0.1 K/uL (ref 0.0–0.1)
Basophils Relative: 1 %
Eosinophils Absolute: 0.1 K/uL (ref 0.0–0.5)
Eosinophils Relative: 1 %
HCT: 42.8 % (ref 36.0–46.0)
Hemoglobin: 14.4 g/dL (ref 12.0–15.0)
Immature Granulocytes: 0 %
Lymphocytes Relative: 22 %
Lymphs Abs: 1.9 K/uL (ref 0.7–4.0)
MCH: 30.1 pg (ref 26.0–34.0)
MCHC: 33.6 g/dL (ref 30.0–36.0)
MCV: 89.4 fL (ref 80.0–100.0)
Monocytes Absolute: 0.5 K/uL (ref 0.1–1.0)
Monocytes Relative: 6 %
Neutro Abs: 5.9 K/uL (ref 1.7–7.7)
Neutrophils Relative %: 70 %
Platelets: 295 K/uL (ref 150–400)
RBC: 4.79 MIL/uL (ref 3.87–5.11)
RDW: 13.2 % (ref 11.5–15.5)
WBC: 8.5 K/uL (ref 4.0–10.5)
nRBC: 0 % (ref 0.0–0.2)

## 2024-06-30 LAB — CBC
HCT: 43 % (ref 36.0–46.0)
Hemoglobin: 14.1 g/dL (ref 12.0–15.0)
MCH: 29.9 pg (ref 26.0–34.0)
MCHC: 32.8 g/dL (ref 30.0–36.0)
MCV: 91.1 fL (ref 80.0–100.0)
Platelets: 305 K/uL (ref 150–400)
RBC: 4.72 MIL/uL (ref 3.87–5.11)
RDW: 13.2 % (ref 11.5–15.5)
WBC: 10.2 K/uL (ref 4.0–10.5)
nRBC: 0 % (ref 0.0–0.2)

## 2024-06-30 LAB — APTT: aPTT: 31 s (ref 24–36)

## 2024-06-30 LAB — CBG MONITORING, ED: Glucose-Capillary: 156 mg/dL — ABNORMAL HIGH (ref 70–99)

## 2024-06-30 LAB — PRO BRAIN NATRIURETIC PEPTIDE: Pro Brain Natriuretic Peptide: 535 pg/mL — ABNORMAL HIGH (ref <300.0)

## 2024-06-30 LAB — PROTIME-INR
INR: 0.9 (ref 0.8–1.2)
Prothrombin Time: 13.1 s (ref 11.4–15.2)

## 2024-06-30 LAB — MAGNESIUM: Magnesium: 2.4 mg/dL (ref 1.7–2.4)

## 2024-06-30 MED ORDER — HYDRALAZINE HCL 20 MG/ML IJ SOLN
5.0000 mg | INTRAMUSCULAR | Status: DC | PRN
  Administered 2024-06-30: 5 mg via INTRAVENOUS
  Filled 2024-06-30: qty 1

## 2024-06-30 MED ORDER — MIRTAZAPINE 15 MG PO TABS
7.5000 mg | ORAL_TABLET | Freq: Every day | ORAL | Status: DC
  Administered 2024-06-30: 7.5 mg via ORAL
  Filled 2024-06-30: qty 1

## 2024-06-30 MED ORDER — ACETAMINOPHEN 650 MG RE SUPP: 650.0000 mg | RECTAL | Status: DC | PRN

## 2024-06-30 MED ORDER — SENNOSIDES-DOCUSATE SODIUM 8.6-50 MG PO TABS: 1.0000 | ORAL_TABLET | Freq: Every evening | ORAL | Status: DC | PRN

## 2024-06-30 MED ORDER — OMEGA-3-ACID ETHYL ESTERS 1 G PO CAPS
1.0000 | ORAL_CAPSULE | Freq: Every day | ORAL | Status: DC
  Administered 2024-07-01: 1 g via ORAL
  Filled 2024-06-30: qty 1

## 2024-06-30 MED ORDER — DIPHENHYDRAMINE HCL 50 MG/ML IJ SOLN: 12.5000 mg | Freq: Three times a day (TID) | INTRAMUSCULAR | Status: DC | PRN

## 2024-06-30 MED ORDER — OXYBUTYNIN CHLORIDE 5 MG PO TABS
5.0000 mg | ORAL_TABLET | Freq: Every evening | ORAL | Status: DC
  Administered 2024-06-30: 5 mg via ORAL
  Filled 2024-06-30: qty 1

## 2024-06-30 MED ORDER — CLOPIDOGREL BISULFATE 75 MG PO TABS
75.0000 mg | ORAL_TABLET | Freq: Every day | ORAL | Status: DC
  Administered 2024-07-01: 75 mg via ORAL
  Filled 2024-06-30: qty 1

## 2024-06-30 MED ORDER — IOHEXOL 350 MG/ML SOLN
75.0000 mL | Freq: Once | INTRAVENOUS | Status: AC | PRN
  Administered 2024-06-30: 75 mL via INTRAVENOUS

## 2024-06-30 MED ORDER — HALOPERIDOL LACTATE 5 MG/ML IJ SOLN
1.0000 mg | Freq: Four times a day (QID) | INTRAMUSCULAR | Status: DC | PRN
  Administered 2024-06-30: 1 mg via INTRAVENOUS
  Filled 2024-06-30: qty 1

## 2024-06-30 MED ORDER — VITAMIN B-12 1000 MCG PO TABS
1000.0000 ug | ORAL_TABLET | Freq: Every day | ORAL | Status: DC
  Administered 2024-07-01: 1000 ug via ORAL
  Filled 2024-06-30: qty 1

## 2024-06-30 MED ORDER — ACETAMINOPHEN 160 MG/5ML PO SOLN: 650.0000 mg | ORAL | Status: DC | PRN

## 2024-06-30 MED ORDER — ASPIRIN 81 MG PO TBEC
81.0000 mg | DELAYED_RELEASE_TABLET | Freq: Every day | ORAL | Status: DC
  Administered 2024-06-30 – 2024-07-01 (×2): 81 mg via ORAL
  Filled 2024-06-30 (×2): qty 1

## 2024-06-30 MED ORDER — SODIUM CHLORIDE 0.9 % IV SOLN: INTRAVENOUS | Status: DC

## 2024-06-30 MED ORDER — ENOXAPARIN SODIUM 40 MG/0.4ML IJ SOSY
40.0000 mg | PREFILLED_SYRINGE | INTRAMUSCULAR | Status: DC
  Administered 2024-06-30: 40 mg via SUBCUTANEOUS
  Filled 2024-06-30: qty 0.4

## 2024-06-30 MED ORDER — VITAMIN D3 25 MCG (1000 UNIT) PO TABS
3000.0000 [IU] | ORAL_TABLET | Freq: Every day | ORAL | Status: DC
  Administered 2024-07-01: 3000 [IU] via ORAL
  Filled 2024-06-30 (×2): qty 3

## 2024-06-30 MED ORDER — ASPIRIN 300 MG RE SUPP: 300.0000 mg | Freq: Every day | RECTAL | Status: DC

## 2024-06-30 MED ORDER — EZETIMIBE 10 MG PO TABS
10.0000 mg | ORAL_TABLET | Freq: Every day | ORAL | Status: DC
  Administered 2024-07-01: 10 mg via ORAL
  Filled 2024-06-30: qty 1

## 2024-06-30 MED ORDER — CLOPIDOGREL BISULFATE 75 MG PO TABS
300.0000 mg | ORAL_TABLET | Freq: Once | ORAL | Status: AC
  Administered 2024-06-30: 300 mg via ORAL
  Filled 2024-06-30: qty 4

## 2024-06-30 MED ORDER — ACETAMINOPHEN 325 MG PO TABS: 650.0000 mg | ORAL_TABLET | ORAL | Status: DC | PRN

## 2024-06-30 MED ORDER — STROKE: EARLY STAGES OF RECOVERY BOOK: Freq: Once | Status: AC

## 2024-06-30 NOTE — ED Notes (Signed)
 Code stroke called per Floy MD

## 2024-06-30 NOTE — H&P (Signed)
 History and Physical    Jill Parrish FMW:[id number] DOB: October 21, 1940 DOA: 06/30/2024  Referring MD/NP/PA:   PCP: Brutus Delon SAUNDERS, NP   Patient coming from:  The patient is coming from ALF   Chief Complaint: Left facial droop, slurred speech, left-sided weakness  HPI: Jill Parrish is a 83 y.o. female with medical history significant of stroke, HTN, HLD, dCHF, dementia, depression with anxiety, hepatocellular carcinoma, diverticulitis, migraine, who presents with left facial droop, slurred speech, left-sided weakness.  Per her daughter at the bedside, patient has history of stroke without significant residual deficit. Patient was last known normal around 4:15 PM yesterday when her daughter left ALF and the patient was at baseline. This morning around 11:00 daughter went to bring patient back home for holiday, noted left leg dragging, not sure when it happened.  After having had lunch with family, around 1:45 PM patient developed dizziness, left facial droop, slurred speech and difficulty speaking.  Patient seems to have weakness in left arm and leg. Her symptoms have improved after 10 minutes.  When I saw patient in ED, she still has mild difficulty speaking, no left facial droop or weakness in extremities.  She denies numbness or tingling in extremities.  No vision loss or hearing loss.  No chest pain, cough, SOB.  Patient nausea and vomited twice earlier.  No diarrhea or abdominal pain.  Denies symptoms of UTI.  No fever or chills.  Patient is alert, orientated to the person and place, not to the time which is close to her mental status baseline per her daughter.  Data reviewed independently and ED Course: pt was found to have WBC 10.2, GFR> 60, INR 0.9, PTT 31, temperature normal, blood pressure 222/76 --> 183/138, heart rate 50-80s, RR 24, oxygen saturation 96% on room air.  CT of head negative acute intracranial abnormalities.  MRI of brain negative for acute stroke.  CTA negative for LVO.   Patient is placed in telemetry bed for observation. Tele neurology, Dr. Jerri is consulted.   MRI-brain 1. No acute infarct or acute intracranial abnormality identified. 2. 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 And nonspecific patchy hemosiderin also in the left frontal lobe.  CTA of neck and head: 1. Negative for large vessel occlusion. 2. 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 Moderate atherosclerotic stenosis of the proximal Basilar Artery, the Right MCA and PCA second order branches (moderate to severe right P2 stenosis) , the ACA A3/4 branches, and the left MCA M3/4 branches. 3. No hemodynamically significant carotid or vertebral artery stenosis.     EKG: I have personally reviewed.  Seem to be sinus rhythm with PAC, QTc 505, early R wave progression   Review of Systems:   General: no fevers, chills, no body weight gain, fatigue HEENT: no blurry vision, hearing changes or sore throat Respiratory: no dyspnea, coughing, wheezing CV: no chest pain, no palpitations GI: no nausea, vomiting, abdominal pain, diarrhea, constipation GU: no dysuria, burning on urination, increased urinary frequency, hematuria  Ext: no leg edema Neuro: Dizziness, left facial droop, slurred speech, difficulty speaking, weakness in left arm and leg Skin: no rash, no skin tear. MSK: No muscle spasm, no deformity, no limitation of range of movement in spin Heme: No easy bruising.  Travel history: No recent long distant travel.   Allergy:  Allergies  Allergen Reactions   Erythromycin Base Shortness  Of Breath   Minocin [Minocycline Hcl] Itching and Rash   Aspirin      Other reaction(s): Other (See Comments) GI upset with high doses.   Atorvastatin      Other reaction(s): Muscle Pain   Cefprozil     Other  reaction(s): Dizziness, Other (See Comments) Abdominal pain.   Erythromycin     Other reaction(s): Dizziness (intolerance) Other reaction(s): Dizziness   Fexofenadine     Other reaction(s): Dizziness   Loratadine     Other reaction(s): Dizziness Other reaction(s): Dizziness   Minocycline     Other reaction(s): Dizziness (intolerance), GI Upset (intolerance) GI upset.   Sulfa Antibiotics Itching and Swelling   Ciprofloxacin  Rash   Keflex [Cephalexin] Itching    Past Medical History:  Diagnosis Date   Anxiety disorder    Dementia (HCC)    Diabetes mellitus without complication (HCC) 09/29/2017   Dyslipidemia    GERD (gastroesophageal reflux disease)    Hepatocellular carcinoma (HCC) 04/07/2018   Hyperlipidemia    Hypertension    Migraine headache    with neurologic features   Migraine headache with aura    Pneumonia    Stroke [facility])    Left brain, posterior   TIA (transient ischemic attack) 12/18/2012    Past Surgical History:  Procedure Laterality Date   ABDOMINAL HYSTERECTOMY     CATARACT EXTRACTION Bilateral    KNEE SURGERY Right 2005   NASAL SINUS SURGERY     OVARIAN CYST REMOVAL     PARTIAL HYSTERECTOMY  1985   ROTATOR CUFF REPAIR Right 2007   arthroscopy   TONSILLECTOMY  1947    Social History:  reports that she has never smoked. She has never used smokeless tobacco. She reports current alcohol use. She reports that she does not use drugs.  Family History:  Family History  Problem Relation Age of Onset   Stroke Mother    Heart attack Father    Breast cancer Sister 4   Aneurysm Sister        Abdominal aortic aneurysm   Stroke Maternal Aunt    Breast cancer Maternal Aunt 44   Stroke Maternal Grandfather    Heart disease Paternal Grandmother    Breast cancer Paternal Grandmother 13   Heart disease Paternal Grandfather      Prior to Admission medications   Medication Sig Start Date End Date Taking? Authorizing Provider  oxybutynin  (DITROPAN ) 5 MG  tablet Take 5 mg by mouth every evening. 04/18/24  Yes [provider]  acetaminophen  (TYLENOL ) 500 MG tablet Take 500 mg by mouth every 6 (six) hours as needed for mild pain or headache.    [provider]  ascorbic acid (VITAMIN C) 500 MG tablet Take 1 tablet by mouth daily.    [provider]  aspirin  EC 81 MG tablet Take 81 mg by mouth daily.    [provider]  Cholecalciferol (VITAMIN D-1000 MAX ST) 25 MCG (1000 UT) tablet Take 3,000 Units by mouth daily.    [provider]  diclofenac sodium (VOLTAREN) 1 % GEL Apply 2 g topically as needed (for pain).    [provider]  EPINEPHrine  0.3 mg/0.3 mL IJ SOAJ injection Inject into the muscle. 11/18/13   [provider]  ezetimibe  (ZETIA ) 10 MG tablet Take 1 tablet (10 mg total) by mouth daily. 01/13/22 02/26/24  Gollan, Timothy J, MD  hydrochlorothiazide  (HYDRODIURIL ) 12.5 MG tablet Take 12.5 mg by mouth daily.    [provider]  mirtazapine  (REMERON )  7.5 MG tablet Take 7.5 mg by mouth at bedtime.    [provider]  Misc Natural Products (HYPROST) CAPS Take 2 tablets by mouth in the morning and at bedtime.    [provider]  Omega-3 1000 MG CAPS Take 1 capsule by mouth daily.    [provider]  polyethylene glycol powder (GLYCOLAX /MIRALAX ) 17 GM/SCOOP powder Take 17 g by mouth daily. 03/06/21   Tahiliani, Varnita B, MD  SUMAtriptan (IMITREX) 100 MG tablet Take by mouth.    [provider]  telmisartan  (MICARDIS ) 80 MG tablet TAKE 1 TABLET BY MOUTH DAILY.  NEEDS APPT WITH CARDIOLOGY FOR FUTURE REFILLS 05/14/21   Gollan, Timothy J, MD  Zinc 10 MG LOZG Take by mouth.    [provider]    Physical Exam: Vitals:   06/30/24 1843 06/30/24 1926 06/30/24 1930 06/30/24 2010  BP: (!) 222/76 (!) 183/138 (!) 207/107 (!) 179/131  Pulse: 68  90 98  Resp: 15 20 16 17   Temp: 98.8 F (37.1 C)  97.7 F (36.5 C) 97.8 F (36.6 C)  TempSrc:  Oral   Oral  SpO2: 98%  97% 98%  Weight:    74.7 kg   General: Not in acute distress HEENT:       Eyes: PERRL, EOMI, no jaundice       ENT: No discharge from the ears and nose, no pharynx injection, no tonsillar enlargement.        Neck: No JVD, no bruit, no mass felt. Heme: No neck lymph node enlargement. Cardiac: S1/S2, RRR, No murmurs, No gallops or rubs. Respiratory: No rales, wheezing, rhonchi or rubs. GI: Soft, nondistended, nontender, no rebound pain, no organomegaly, BS present. GU: No hematuria Ext: No pitting leg edema bilaterally. 1+DP/PT pulse bilaterally. Musculoskeletal: No joint deformities, No joint redness or warmth, no limitation of ROM in spin. Skin: No rashes.  Neuro: Alert, orientated to person and place, slightly agitated, not oriented to time, still has mild difficulty speaking, cranial nerves II-XII grossly intact, moves all extremities normally. Muscle strength 5/5 in all extremities, sensation to light touch intact.  Psych: Patient is not psychotic, no suicidal or hemocidal ideation.  Labs on Admission: I have personally reviewed following labs and imaging studies  CBC: Recent Labs  Lab 06/30/24 1600  WBC 10.2  HGB 14.1  HCT 43.0  MCV 91.1  PLT 305   Basic Metabolic Panel: Recent Labs  Lab 06/30/24 1600  NA 138  K 4.4  CL 103  CO2 24  GLUCOSE 144*  BUN 15  CREATININE 0.92  CALCIUM  9.3  MG 2.4   GFR: Estimated Creatinine Clearance: 47.6 mL/min (by C-G formula based on SCr of 0.92 mg/dL). Liver Function Tests: Recent Labs  Lab 06/30/24 1600  AST 28  ALT 16  ALKPHOS 92  BILITOT 0.4  PROT 6.6  ALBUMIN 3.9   No results for input(s): LIPASE, AMYLASE in the last 168 hours. No results for input(s): AMMONIA in the last 168 hours. Coagulation Profile: Recent Labs  Lab 06/30/24 1600  INR 0.9   Cardiac Enzymes: No results for input(s): CKTOTAL, CKMB, CKMBINDEX, TROPONINI in the last 168 hours. BNP (last 3  results) Recent Labs    06/30/24 1600  PROBNP 535.0*   HbA1C: No results for input(s): HGBA1C in the last 72 hours. CBG: Recent Labs  Lab 06/30/24 1548  GLUCAP 156*   Lipid Profile: No results for input(s): CHOL, HDL, LDLCALC, TRIG, CHOLHDL, LDLDIRECT in the last 72 hours. Thyroid  Function Tests: No results for input(s): TSH, T4TOTAL, FREET4, T3FREE, THYROIDAB in the last 72 hours. Anemia Panel: No results for input(s): VITAMINB12, FOLATE, FERRITIN, TIBC, IRON, RETICCTPCT in the last 72 hours. Urine analysis:    Component Value Date/Time   COLORURINE YELLOW (A) 04/28/2024 1717   APPEARANCEUR HAZY (A) 04/28/2024 1717   APPEARANCEUR Hazy 07/22/2013 2048   LABSPEC 1.017 04/28/2024 1717   LABSPEC 1.019 07/22/2013 2048   PHURINE 7.0 04/28/2024 1717   GLUCOSEU NEGATIVE 04/28/2024 1717   GLUCOSEU Negative 07/22/2013 2048   HGBUR NEGATIVE 04/28/2024 1717   BILIRUBINUR NEGATIVE 04/28/2024 1717   BILIRUBINUR Negative 07/22/2013 2048   KETONESUR 5 (A) 04/28/2024 1717   PROTEINUR NEGATIVE 04/28/2024 1717   UROBILINOGEN 0.2 12/18/2012 1409   NITRITE POSITIVE (A) 04/28/2024 1717   LEUKOCYTESUR NEGATIVE 04/28/2024 1717   LEUKOCYTESUR Negative 07/22/2013 2048   Sepsis Labs: @LABRCNTIP (procalcitonin:4,lacticidven:4) )No results found for this or any previous visit (from the past 240 hours).   Radiological Exams on Admission:   Assessment/Plan Active Problems:   TIA (transient ischemic attack)   History of stroke   HTN (hypertension)   HLD (hyperlipidemia)   Chronic diastolic CHF (congestive heart failure) (HCC)   Migraine   Dementia without behavioral disturbance (HCC)   Depression with anxiety   Assessment and Plan:  TIA (transient ischemic attack): Her symptoms have improved except for mild difficulty speaking.  CTA negative for LVO.  MRI of brain negative for acute stroke.  Possibly due to TIA.  Dr. Jerri of teleneurology was  consulted.  -Placed on tele bed for observation - will hold oral Bp meds to allow permissive HTN   - ASA 81 mg daily - plavix : 300 mg x 1 and then 75 mg daily - Statin: pt is allergic to statin - continue home zetia  - fasting lipid panel and HbA1c  - 2D transthoracic echocardiography  - swallowing screen. If fails, will get SLP - PT/OT consult - f/u UA  History of stroke -ASA and Plavix  as above -Zetia   HTN (hypertension) -- prn IV hydralazine  for SBP>220 or dBP>120 - Hold HCTZ and Micardis   HLD (hyperlipidemia) -Zetia   Chronic diastolic CHF (congestive heart failure) (HCC): 2D echo on 12/19/2012 showed EF of 55 to 60% with grade 1 diastolic dysfunction.  Patient does not have leg edema, no SOB.  CHF seems to be compensated. -check BNP  Dementia without behavioral disturbance (HCC): -pt is not taking her Namenda currently - As needed Haldol  for agitation  Depression with anxiety - Remeron       DVT ppx: SQ Lovenox   Code Status: Full code per her daughter  Family Communication:      Yes, patient's  daughter at bed side.    Disposition Plan:  Anticipate discharge back to previous environment, ALF  Consults called:  Dr. Jerri of tele neuro  Admission status and Level of care: Telemetry:    for obs         Dispo: The patient is from: Home              Anticipated d/c is to: Home              Anticipated d/c date is: 1 day              Patient currently is not medically stable to d/c.    Severity of Illness:  The appropriate patient status for this patient is OBSERVATION. Observation status is judged to be reasonable and necessary in order to provide  the required intensity of service to ensure the patient's safety. The patient's presenting symptoms, physical exam findings, and initial radiographic and laboratory data in the context of their medical condition is felt to place them at decreased risk for further clinical deterioration. Furthermore, it is anticipated  that the patient will be medically stable for discharge from the hospital within 2 midnights of admission.        Date of Service 06/30/2024    Caleb Exon [facility]   If 7PM-7AM, please contact night-coverage [email or web address] 06/30/2024, 8:37 PM

## 2024-06-30 NOTE — Progress Notes (Signed)
   06/30/24 1600  Spiritual Encounters  Type of Visit Initial  Care provided to: Pt and family  Conversation partners present during encounter Nurse  Referral source Code page  Reason for visit Code  OnCall Visit Yes   Chaplain responded to code stroke. Patient just returning from scans. Chaplain provided compassionate presence and reflective listening to patient's family. Patient has several supportive family members in room. Chaplain is available for follow up as needed.

## 2024-06-30 NOTE — ED Notes (Signed)
 Floy MD notified of BP by secure message

## 2024-06-30 NOTE — ED Notes (Signed)
 Patient transported to MRI

## 2024-06-30 NOTE — ED Provider Notes (Signed)
 [facility] Provider Note    Event Date/Time   First MD Initiated Contact with Patient 06/30/24 1546     (approximate)   History   Stroke Symptoms   HPI  Jill Parrish is a 83 y.o. female who presents to the emergency department today because of concerns for weakness and slurred speech.  The patient has history of dementia.  History is primarily obtained from family at bedside.  They state that around 11:00 when they went to pick up the patient they noticed that she was having increased difficulty with walking.  They states she has been weak on her left side.  Additionally they felt like she had facial droop and some slurred speech.  It is somewhat improved at the time of my exam.  Patient has history of stroke.  They have not noticed any recent signs of illness.  No fevers.     Physical Exam   Triage Vital Signs: ED Triage Vitals  Encounter Vitals Group     BP 06/30/24 1602 (!) 176/133     Girls Systolic BP Percentile --      Girls Diastolic BP Percentile --      Boys Systolic BP Percentile --      Boys Diastolic BP Percentile --      Pulse Rate 06/30/24 1602 (!) 52     Resp 06/30/24 1602 12     Temp 06/30/24 1602 (!) 97.5 F (36.4 C)     Temp Source 06/30/24 1602 Oral     SpO2 06/30/24 1548 99 %     Weight --      Height --      Head Circumference --      Peak Flow --      Pain Score 06/30/24 1603 0     Pain Loc --      Pain Education --      Exclude from Growth Chart --     Most recent vital signs: Vitals:   06/30/24 1548 06/30/24 1602  BP:  (!) 176/133  Pulse:  (!) 52  Resp:  12  Temp:  (!) 97.5 F (36.4 C)  SpO2: 99% 95%   General: Awake, alert, not completely oriented. CV:  Good peripheral perfusion. Regular rate and rhythm. Resp:  Normal effort. Lungs clear. Abd:  No distention. Non tender.  ED Results / Procedures / Treatments   Labs (all labs ordered are listed, but only abnormal results are displayed) Labs Reviewed   CBG MONITORING, ED - Abnormal; Notable for the following components:      Result Value   Glucose-Capillary 156 (*)    All other components within normal limits     EKG  I, Guadalupe Eagles, attending physician, personally viewed and interpreted this EKG  EKG Time: 1549 Rate: 62 Rhythm: sinus rhythm Axis: normal Intervals: qtc 505 QRS: narrow, q waves v1, v2 ST changes: no st elevation Impression: abnormal ekg   RADIOLOGY I independently interpreted and visualized the CT head. My interpretation: No ICH Radiology interpretation:  IMPRESSION:  1. Mild motion artifact. No acute intracranial abnormality. ASPECTS 10.  2. Stable non-contrast CT appearance of chronic small vessel disease,  disproportionate mesial temporal atrophy.   I, Guadalupe Eagles, personally discussed these images (CT head) and results by phone with the on-call radiologist and used this discussion as part of my medical decision making.    I independently interpreted and visualized the MR brain. My interpretation: No large mass Radiology interpretation: IMPRESSION:  1. No acute infarct or acute intracranial abnormality identified.  2. 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 And nonspecific  patchy hemosiderin also in the left frontal lobe.     PROCEDURES:  Critical Care performed: No    MEDICATIONS ORDERED IN ED: Medications - No data to display   IMPRESSION / MDM / ASSESSMENT AND PLAN / ED COURSE  I reviewed the triage vital signs and the nursing notes.                              Differential diagnosis includes, but is not limited to, CVA, TIA, infection, intracranial mass  Patient's presentation is most consistent with acute presentation with potential threat to life or bodily function.   The patient is on the cardiac monitor to evaluate for evidence of arrhythmia and/or significant heart  rate changes.  Patient presented to the emergency department today because of concerns for left-sided weakness and facial droop.  Patient does have history of dementia so history primarily obtained from family.  Patient was called a code stroke teleneurology did evaluate did not feel patient was a candidate for TNK.  MRI did not show acute stroke however do have concerns for TIA.  Will discuss with hospitalist service for further workup and management.      FINAL CLINICAL IMPRESSION(S) / ED DIAGNOSES   Final diagnoses:  Left-sided weakness     Note:  This document was prepared using Dragon voice recognition software and may include unintentional dictation errors.    Floy Roberts, MD 06/30/24 [phone]

## 2024-06-30 NOTE — ED Triage Notes (Signed)
 Pt lives at [facility],   Pt was at family house and family report facial drop and slurred speech around 1445. EMS reported pt also had one emesis episode today.   Hx of dementia, pt is alert and oriented times 2, person and place.  On assessment pt shows facial drip on right side, mild drift on left arm and weakness on left leg. Speech clear, and following commands.   Family bedside, report than patient had weakness on left leg  but today is worse than before.   BP with ems 190/78 99% Ra CBG 150

## 2024-06-30 NOTE — Consult Note (Signed)
 [facility] Telemedicine Consult   Requesting Provider: Dr. Floy  Chief Complaint: left facial droop, slurry speech  HPI: 83 yo F with hx of HTN, HLD, migraine, dementia presented to ED for code stroke.  Per daughter, she lives in the nursing facility and yesterday around 4:15 PM daughter left facility and patient was at baseline without arm or leg weakness or facial droop.  This morning 11:00 daughter went to bring patient back home for holiday, noted left leg dragging, not sure when it happened.  At home having lunch with family, around 1:45 PM patient had acute onset dizziness, pale and 1 episode of nausea vomiting.  Family also found patient had left facial droop and slurred speech, tried to get her up with walker and she had significant weakness both legs but more so on the left.  Did not notice much left arm strength.  About 10 minutes symptoms started to improve.  On EMS arrival, they reported speech back to baseline and no more left facial droop.  However ED, she was found to still have left arm and leg weakness.  Code stroke activated.  CT no acute abnormality.  In CT suite, patient again had 1 episode of nausea but no vomiting.  On exam patient has left arm and leg drift but has clear speech and no facial droop.  Per chart, patient on 12/17/2012 had left-sided weakness and numbness, slurred speech with right-sided headache.  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  Carotid Doppler left ICA 40 to 59% stenosis.  2D echo unremarkable.  Cardiac event monitor for 3 weeks was unremarkable.  At that time patient was on Plavix .  Per daughter, patient had a no significant residual deficit from stroke except left leg mildly inward rotation.  She was not on aspirin  in the facility.  She was on Namenda before but recently discontinued.  LKW: 4:15 PM yesterday TNK given?: No, outside window IR Thrombectomy? No, no LVO Modified Rankin Scale: 3-Moderate  disability-requires help but walks WITHOUT assistance   Exam: Vitals:   06/30/24 1548 06/30/24 1602  BP:  (!) 176/133  Pulse:  (!) 52  Resp:  12  Temp:  (!) 97.5 F (36.4 C)  SpO2: 99% 95%     Temp:  [97.5 F (36.4 C)] 97.5 F (36.4 C) (11/27 1602) Pulse Rate:  [52] 52 (11/27 1602) Resp:  [12] 12 (11/27 1602) BP: (176)/(133) 176/133 (11/27 1602) SpO2:  [95 %-99 %] 95 % (11/27 1602)  General - Well nourished, well developed, in no apparent distress.  Ophthalmologic - fundi not visualized due to noncooperation.  Cardiovascular - Regular rhythm and rate.  Neuro - awake, alert, eyes open, told me age 35 instead of 63, not orientated to place, situation or time. No aphasia, fluent language, following all simple commands. Able to name and repeat. No gaze palsy, tracking bilaterally, visual field full. No facial droop. Tongue midline. RUE and RLE no drift, LUE and LLE drift but not touching bed. Sensation symmetrical bilaterally subjectively, b/l FTN intact although slow, gait not tested.     NIH Stroke Scale  Level Of Consciousness 0=Alert; keenly responsive 1=Arouse to minor stimulation 2=Requires repeated stimulation to arouse or movements to pain 3=postures or unresponsive 0  LOC Questions to Month and Age 30=Answers both questions correctly 1=Answers one question correctly or dysarthria/intubated/trauma/language barrier 2=Answers neither question correctly or aphasia 2  LOC Commands      -Open/Close eyes     -Open/close grip     -  Pantomime commands if communication barrier 0=Performs both tasks correctly 1=Performs one task correctly 2=Performs neighter task correctly 0  Best Gaze     -Only assess horizontal gaze 0=Normal 1=Partial gaze palsy 2=Forced deviation, or total gaze paresis 0  Visual 0=No visual loss 1=Partial hemianopia 2=Complete hemianopia 3=Bilateral hemianopia (blind including cortical blindness) 0  Facial Palsy     -Use grimace if obtunded  0=Normal symmetrical movement 1=Minor paralysis (asymmetry) 2=Partial paralysis (lower face) 3=Complete paralysis (upper and lower face) 0  Motor  0=No drift for 10/5 seconds 1=Drift, but does not hit bed 2=Some antigravity effort, hits  bed 3=No effort against gravity, limb falls 4=No movement 0=Amputation/joint fusion Right Arm 0     Leg 0    Left Arm 1     Leg 1  Limb Ataxia     - FNT/HTS 0=Absent or does not understand or paralyzed or amputation/joint fusion 1=Present in one limb 2=Present in two limbs 0  Sensory 0=Normal 1=Mild to moderate sensory loss 2=Severe to total sensory loss or coma/unresponsive 0  Best Language 0=No aphasia, normal 1=Mild to moderate aphasia 2=Severe aphasia 3=Mute, global aphasia, or coma/unresponsive 0  Dysarthria 0=Normal 1=Mild to moderate 2=Severe, unintelligible or mute/anarthric 0=intubated/unable to test 0  Extinction/Neglect 0=No abnormality 1=visual/tactile/auditory/spatia/personal inattention/Extinction to bilateral simultaneous stimulation 2=Profound neglect/extinction more than 1 modality  0  Total   4      Imaging Reviewed:  CT ANGIO HEAD NECK W WO CM (CODE STROKE) Result Date: 06/30/2024 EXAM: CTA HEAD AND NECK WITH AND WITHOUT 06/30/2024 04:51:20 PM TECHNIQUE: CTA of the head and neck was performed with and without the administration of intravenous contrast. Multiplanar 2D and/or 3D reformatted images are provided for review. Automated exposure control, iterative reconstruction, and/or weight based adjustment of the mA/kV was utilized to reduce the radiation dose to as low as reasonably achievable. Stenosis of the internal carotid arteries measured using NASCET criteria. COMPARISON: CT head today reported separately. CLINICAL HISTORY: 83 year old female Code Stroke presentation. Neuro deficit, acute, stroke suspected. FINDINGS: CTA NECK: AORTIC ARCH AND ARCH VESSELS: Moderate to advanced calcified atherosclerosis of the aortic  arch. 4 vessel arch configuration, the left vertebral artery arises directly from the aortic arch (normal variant). No dissection or arterial injury. No significant stenosis of the brachiocephalic or subclavian arteries. CAROTID ARTERIES: Brachiocephalic artery atherosclerosis without stenosis. Tortuous right CCA with a partially retropharyngeal course. Calcified right carotid bifurcation, right ICA origin and bulb with less than 50% stenosis. Partially retropharyngeal course of the cervical right ICA. Right ICA siphon moderate calcified atherosclerosis. Mild to moderate right ICA supraclinoid segment stenosis. No left CCA atherosclerosis or stenosis. Soft and calcified plaque at the left ICA origin and bulb with less than 50% stenosis. Mildly tortuous left ICA. Mildly beaded appearance of the bilateral cervical ICAs distal to the carotid bulbs (series 10 image 19) compatible with fibromuscular dysplasia (FMD). Left ICA siphon moderate calcified atherosclerosis but no significant left ICA stenosis. VERTEBRAL ARTERIES: Mild right subclavian artery calcified plaque without stenosis. Mild right vertebral artery origin calcified plaque without stenosis. Tortuous right V1 and V2 segments. Mildly beaded appearance of the right vertebral artery V3 segment. Mildly beaded appearance of the right vertebral artery V4 segment on series 6 image 154 and series 8 image 117. No right vertebral artery stenosis to the basilar. Left vertebral artery arises directly from the aortic arch. No origin stenosis. Tortuous left vertebral artery with a late entry into the cervical transverse foramen on series 6 image 245.  Fetal appearance of the left vertebral artery V3 segment without stenosis. Mildly beaded left vertebral artery V4 segment. No left vertebral stenosis to the basilar. LUNGS AND MEDIASTINUM: Upper chest mild respiratory motion and dependent atelectasis. SOFT TISSUES: Nonvascular neck soft tissue spaces are within normal limits.  BONES: Advanced chronic TMJ degeneration bilaterally. Cervical spine degeneration superimposed on degenerative cervical facet ankylosis on the left at C2-C3 and C4-C5. No acute osseous abnormality. CTA HEAD: ANTERIOR CIRCULATION: No aneurysm. Diminutive or absent anterior communicating artery. Bilateral ACA branches are patent with moderate bilateral A3 and A4 segment irregularity and stenosis on series 11 image 23. Mild left MCA M1 segment irregularity without significant stenosis. Patent left MCA bifurcation. Mild right MCA M1 irregularity without stenosis. Patent right MCA bifurcation. No MCA branch occlusion is identified. There is moderate right MCA M2 segment irregularity and stenosis on series 11 image 16. Moderate distal left MCA branch stenosis (M4 on series 11 image 31). POSTERIOR CIRCULATION: Normal bilateral PICA origins. Patent basilar artery although with moderate proximal basilar irregularity and stenosis on series 11 image 25. Patent SCA and PCA origins. Mild fusiform enlargement of the shared origin of the right PCA and SCA on series 10 image 22. No saccular aneurysm there. Diminutive or absent posterior communicating arteries. Bilateral PCA branches are patent with mild left P2 and moderate to severe right P2 segment irregularity and stenosis (series 11 image 21 on the right). No aneurysm. OTHER: The superior sagittal sinus is patent. IMPRESSION: 1. Negative for large vessel occlusion. 2. 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 Moderate atherosclerotic stenosis of the proximal Basilar Artery, the Right MCA and PCA second order branches (moderate to severe right P2 stenosis) , the ACA A3/4 branches, and the left MCA M3/4 branches. 3. No hemodynamically significant carotid or vertebral artery stenosis. Electronically signed by: Helayne Hurst MD 06/30/2024 05:06 PM EST RP Workstation: [id number]   CT HEAD CODE STROKE  WO CONTRAST (LKW 0-4.5h, LVO 0-24h) Addendum Date: 06/30/2024 ADDENDUM #1 ADDENDUM: Study discussed by telephone with Dr. Floy at 1637 hours on 06/30/2024. ---------------------------------------------------- Electronically signed by: Helayne Hurst MD 06/30/2024 04:43 PM EST RP Workstation: [id number]   Result Date: 06/30/2024 ORIGINAL REPORT EXAM: CT HEAD WITHOUT 06/30/2024 04:24:32 PM TECHNIQUE: CT of the head was performed without the administration of intravenous contrast. Automated exposure control, iterative reconstruction, and/or weight based adjustment of the mA/kV was utilized to reduce the radiation dose to as low as reasonably achievable. COMPARISON: Brain MRI 04/21/2014. Head CT 04/28/2024. CLINICAL HISTORY: 83 year old female. Neuro deficit, acute, stroke suspected. FINDINGS: LIMITATIONS/ARTIFACTS: Mild motion artifact today. BRAIN AND VENTRICLES: No acute intracranial hemorrhage. No mass effect or midline shift. No extra-axial fluid collection. No evidence of acute infarct. No hydrocephalus. Stable brain volume with evidence of disproportionate mesial temporal atrophy (coronal image 31). Patchy periventricular white matter hypodensity is stable. Stable heterogeneity in the deep gray nuclei. Stable basal ganglia vascular calcifications. No suspicious intracranial vascular hyperdensity. ORBITS: No convincing gaze deviation. SINUSES AND MASTOIDS: Chronic paranasal sinus surgery redemonstrated. Tympanic cavities and mastoids remain well aerated. SOFT TISSUES AND SKULL: Partially visible advanced degeneration at the anterior C1-C2 articulation. Calcified atherosclerosis at the skull base. No acute osseous abnormality. No acute soft tissue abnormality. IMPRESSION: 1. Mild motion artifact. No acute intracranial abnormality. ASPECTS 10. 2. Stable non-contrast CT appearance of chronic small vessel disease, disproportionate mesial temporal atrophy. Electronically signed by: Helayne Hurst MD 06/30/2024 04:32  PM EST RP Workstation: [id number]  Labs reviewed in epic and pertinent values follow: Glucose 156, Cre 0.92, platelet 305  Assessment:  83 yo F with hx of HTN, HLD, migraine, dementia presented to ED for left leg dragging found this morning 11am, but at baseline yesterday 4:15 PM.  Also had dizziness, pale, nausea vomiting, left facial droop and slurred speech around 1:45 PM during lunchtime.  Symptoms improved but still has left-sided drift in ED. NIHSS = 4.  CT no acute abnormality, CTA head and neck no LVO.  Patient not a TNK candidate given out of window, not IR candidate given no LVO.  Patient symptoms concerning for subcortical small infarct on the right.  Recommend admission for further stroke workup.  Recommendations:  Recommend admission for further stroke work up  Frequent neuro checks Telemetry monitoring MRI brain  Echocardiogram  UDS, fasting lipid panel and HgbA1C PT/OT/speech consult Permissive hypertension (only treat if BP > 220/120 unless a lower blood pressure is clinically necessary) for 24-48 hours post stroke onset GI and DVT prophylaxis  ASA PR 300 mg if no po access or ASA 81 mg if passed swallow screening also consider plavix  300mg  load if passed swallow for minor stroke / TIA Stroke risk factor modification Discussed with Dr. Floy ED physician We will follow  Consult Participants: RN, CT tech, pt and me Location of the provider: [facility] Location of the patient: [facility]  Time Code Stroke Page received:  1615 Time neurologist arrived:  1616 Time NIHSS completed: 1638    This consult was provided via telemedicine with 2-way video and audio communication. The patient/family was informed that care would be provided in this way and agreed to receive care in this manner.   This patient is receiving care for possible acute neurological changes. There was 60 minutes of care by this provider at the time of service, including time for direct evaluation via  telemedicine, review of medical records, imaging studies and discussion of findings with providers, the patient and/or family.  Ary Cummins, MD PhD Stroke Neurology 06/30/2024 5:02 PM

## 2024-06-30 NOTE — ED Notes (Signed)
 Initiated code stroke to [facility], [place], [phone]

## 2024-07-01 ENCOUNTER — Observation Stay: Admit: 2024-07-01 | Discharge: 2024-07-01 | Disposition: A | Attending: Internal Medicine | Admitting: Internal Medicine

## 2024-07-01 DIAGNOSIS — Z8673 Personal history of transient ischemic attack (TIA), and cerebral infarction without residual deficits: Secondary | ICD-10-CM | POA: Diagnosis not present

## 2024-07-01 DIAGNOSIS — G459 Transient cerebral ischemic attack, unspecified: Secondary | ICD-10-CM | POA: Diagnosis not present

## 2024-07-01 DIAGNOSIS — F418 Other specified anxiety disorders: Secondary | ICD-10-CM

## 2024-07-01 DIAGNOSIS — R531 Weakness: Secondary | ICD-10-CM | POA: Diagnosis not present

## 2024-07-01 DIAGNOSIS — E785 Hyperlipidemia, unspecified: Secondary | ICD-10-CM | POA: Diagnosis not present

## 2024-07-01 DIAGNOSIS — I10 Essential (primary) hypertension: Secondary | ICD-10-CM | POA: Diagnosis not present

## 2024-07-01 LAB — HEMOGLOBIN A1C
Hgb A1c MFr Bld: 6.3 % — ABNORMAL HIGH (ref 4.8–5.6)
Mean Plasma Glucose: 134 mg/dL

## 2024-07-01 LAB — URINALYSIS, COMPLETE (UACMP) WITH MICROSCOPIC
Bilirubin Urine: NEGATIVE
Glucose, UA: NEGATIVE mg/dL
Ketones, ur: NEGATIVE mg/dL
Nitrite: NEGATIVE
Protein, ur: NEGATIVE mg/dL
Specific Gravity, Urine: 1.018 (ref 1.005–1.030)
WBC, UA: >50 WBC/hpf (ref 0–5)
pH: 7 (ref 5.0–8.0)

## 2024-07-01 LAB — LIPID PANEL
Cholesterol: 224 mg/dL — ABNORMAL HIGH (ref 0–200)
HDL: 55 mg/dL (ref >40)
LDL Cholesterol: 146 mg/dL — ABNORMAL HIGH (ref 0–99)
Total CHOL/HDL Ratio: 4.1 ratio
Triglycerides: 119 mg/dL (ref <150)
VLDL: 24 mg/dL (ref 0–40)

## 2024-07-01 LAB — URINE DRUG SCREEN
Amphetamines: NEGATIVE
Barbiturates: NEGATIVE
Benzodiazepines: NEGATIVE
Cocaine: NEGATIVE
Fentanyl: NEGATIVE
Methadone Scn, Ur: NEGATIVE
Opiates: NEGATIVE
Tetrahydrocannabinol: NEGATIVE

## 2024-07-01 LAB — CBC
HCT: 41.5 % (ref 36.0–46.0)
Hemoglobin: 14 g/dL (ref 12.0–15.0)
MCH: 29.7 pg (ref 26.0–34.0)
MCHC: 33.7 g/dL (ref 30.0–36.0)
MCV: 88.1 fL (ref 80.0–100.0)
Platelets: 281 K/uL (ref 150–400)
RBC: 4.71 MIL/uL (ref 3.87–5.11)
RDW: 13.3 % (ref 11.5–15.5)
WBC: 5.3 K/uL (ref 4.0–10.5)
nRBC: 0 % (ref 0.0–0.2)

## 2024-07-01 LAB — ECHOCARDIOGRAM COMPLETE
Height: 66 in
S' Lateral: 2.8 cm
Weight: 2634.94 [oz_av]

## 2024-07-01 MED ORDER — ROSUVASTATIN CALCIUM 10 MG PO TABS
10.0000 mg | ORAL_TABLET | Freq: Every day | ORAL | Status: DC
  Administered 2024-07-01: 10 mg via ORAL
  Filled 2024-07-01: qty 1

## 2024-07-01 MED ORDER — OLANZAPINE 10 MG IM SOLR
2.5000 mg | Freq: Once | INTRAMUSCULAR | Status: AC
  Administered 2024-07-01: 2.5 mg via INTRAMUSCULAR
  Filled 2024-07-01: qty 10

## 2024-07-01 MED ORDER — CLOPIDOGREL BISULFATE 75 MG PO TABS: 75.0000 mg | ORAL_TABLET | Freq: Every day | ORAL | 0 refills | Status: AC

## 2024-07-01 MED ORDER — HALOPERIDOL LACTATE 5 MG/ML IJ SOLN
2.0000 mg | Freq: Four times a day (QID) | INTRAMUSCULAR | Status: DC | PRN
  Administered 2024-07-01: 2 mg via INTRAVENOUS
  Filled 2024-07-01: qty 1

## 2024-07-01 MED ORDER — ASPIRIN 81 MG PO TBEC: 81.0000 mg | DELAYED_RELEASE_TABLET | Freq: Every day | ORAL | 12 refills | Status: AC

## 2024-07-01 NOTE — Progress Notes (Signed)
*  PRELIMINARY RESULTS* Echocardiogram 2D Echocardiogram has been performed.  Jill Parrish 07/01/2024, 8:28 AM

## 2024-07-01 NOTE — Progress Notes (Signed)
 Pt refused NIH assessment and took off telemetry. Dr. Lawence made aware.

## 2024-07-01 NOTE — Hospital Course (Addendum)
 Partly taken from prior notes.  Jill Parrish is a 83 y.o. female with medical history significant of stroke, HTN, HLD, dCHF, dementia, depression with anxiety, hepatocellular carcinoma, diverticulitis, migraine, who presents with left facial droop, slurred speech, left-sided weakness.   Patient was last known normal around 4:15 PM yesterday when her daughter left ALF and the patient was at baseline. This morning around 11:00 daughter went to bring patient back home for holiday, noted left leg dragging, not sure when it happened.  After having had lunch with family, around 1:45 PM patient developed dizziness, left facial droop, slurred speech and difficulty speaking.  Symptoms improved after 10 minutes.  At the time of the exam still having mild difficulty speaking.  On presentation blood pressure was elevated at 222/76, labs with WBC 10.2, labs mostly unremarkable.  CT of the head negative for any acute intracranial abnormalities.  MRI of brain negative for acute stroke.  CTA negative for LVO.  Neurology was consulted, no tPA or as he was considered as out of window.  Admitted to complete stroke/TIA workup.  11/28: Vital stable, blood pressure at 143/88, UDS negative, echocardiogram without any significant abnormality, lipid panel with increased cholesterol, LDL of 146.  Patient has significant atherosclerosis, unable to tolerate statin even at a lower dose which was suggested by neurology.  Advised son to discuss with PCP for Repatha. Neurology is suggesting DAPT with aspirin  and Plavix  for 23-month followed by aspirin  only.  PT was recommending home health so it was ordered to be done at her assisted living facility.  Patient will continue the rest of her home medications as she was doing it before and follow-up with her providers for further assistance.

## 2024-07-01 NOTE — Progress Notes (Signed)
 NEUROLOGY CONSULT FOLLOW UP NOTE   Date of service: July 01, 2024 Patient Name: EVIANNA CHANDRAN MRN:  [id number] DOB:  [birth year]  Interval Hx/subjective   Still with slight facial weakness, but mostly resolved.  Vitals   Vitals:   06/30/24 2010 06/30/24 2158 06/30/24 2304 07/01/24 0020  BP: (!) 179/131  (!) 143/88   Pulse: 98  84   Resp: 17     Temp: 97.8 F (36.6 C)     TempSrc: Oral   (P) Other (Comment)  SpO2: 98%     Weight: 74.7 kg     Height:  5' 6 (1.676 m)       Body mass index is 26.58 kg/m.  Physical Exam   Constitutional: Appears well-developed and well-nourished.  Neurologic Examination    MS: She is awake, alert, not oriented to month, year, or location (dementia baseline) CN: EOMI, mild left facial weakness VFF Motor: She is able to hold her arms without drift, patient but does have mild left leg weakness (possibly baseline close Sensory: Endorses symmetric since  Medications  Current Facility-Administered Medications:     stroke: early stages of recovery book, , Does not apply, Once, Jerri Pfeiffer, MD   0.9 %  sodium chloride  infusion, , Intravenous, Continuous, Niu, Xilin, MD, Stopped at 07/01/24 0449   acetaminophen  (TYLENOL ) tablet 650 mg, 650 mg, Oral, Q4H PRN **OR** acetaminophen  (TYLENOL ) 160 MG/5ML solution 650 mg, 650 mg, Per Tube, Q4H PRN **OR** acetaminophen  (TYLENOL ) suppository 650 mg, 650 mg, Rectal, Q4H PRN, Niu, Xilin, MD   [DISCONTINUED] aspirin  suppository 300 mg, 300 mg, Rectal, Daily **OR** aspirin  EC tablet 81 mg, 81 mg, Oral, Daily, Jerri Pfeiffer, MD, 81 mg at 06/30/24 1741   cholecalciferol (VITAMIN D3) tablet 3,000 Units, 3,000 Units, Oral, Daily, Niu, Xilin, MD   clopidogrel  (PLAVIX ) tablet 75 mg, 75 mg, Oral, Daily, Niu, Xilin, MD   cyanocobalamin (VITAMIN B12) tablet 1,000 mcg, 1,000 mcg, Oral, Daily, Niu, Xilin, MD   diphenhydrAMINE  (BENADRYL ) injection 12.5 mg, 12.5 mg, Intravenous, Q8H PRN, Niu, Xilin, MD   enoxaparin   (LOVENOX ) injection 40 mg, 40 mg, Subcutaneous, Q24H, Niu, Xilin, MD, 40 mg at 06/30/24 2136   ezetimibe  (ZETIA ) tablet 10 mg, 10 mg, Oral, Daily, Niu, Xilin, MD   haloperidol  lactate (HALDOL ) injection 2 mg, 2 mg, Intravenous, Q6H PRN, Niu, Xilin, MD, 2 mg at 07/01/24 9160   hydrALAZINE  (APRESOLINE ) injection 5 mg, 5 mg, Intravenous, Q2H PRN, Niu, Xilin, MD, 5 mg at 06/30/24 2131   mirtazapine  (REMERON ) tablet 7.5 mg, 7.5 mg, Oral, QHS, Niu, Xilin, MD, 7.5 mg at 06/30/24 2124   omega-3 acid ethyl esters (LOVAZA ) capsule 1 g, 1 capsule, Oral, Daily, Niu, Xilin, MD   oxybutynin  (DITROPAN ) tablet 5 mg, 5 mg, Oral, QPM, Niu, Xilin, MD, 5 mg at 06/30/24 2124   senna-docusate (Senokot-S) tablet 1 tablet, 1 tablet, Oral, QHS PRN, Niu, Xilin, MD  Labs and Diagnostic Imaging   LDL - 146 A1c - pending Echo - no embolic source   Imaging(Personally reviewed): CTA- Advanced intracranial atherosclerosis.  MRI - no stroke  Assessment   KAMIYAH KINDEL is a 83 y.o. female with transient worsening of left-sided weakness yesterday.  She did not have any abrupt loss of consciousness, twitching, or other episodes concerning for seizure.  My suspicion is that this is related to her severe intracranial atherosclerotic disease and I would favor treating as cerebrovascular nature (e.g. TIA).  She has had myalgias with atorvastatin  in the past, and  I wonder about trialing a differnet statin that she may be able to tolerate.   Recommendations  Start crestor  10mg  daily, can DC if any muscle pain given her age.  Asa 81mg  and plavix  75mg  x 90 days, then asa monotherapy.  Pt/ot evals  F/u A1c, goal < 7 Neurology will be available as needed.  ______________________________________________________________________   Bonney Aisha Seals, MD [facility]

## 2024-07-01 NOTE — Evaluation (Signed)
 Speech Language Pathology Evaluation Patient Details Name: Jill Parrish MRN: [id number] DOB: Mar 26, 1941 Today's Date: 07/01/2024 Time: 9049-8984 SLP Time Calculation (min) (ACUTE ONLY): 25 min  Problem List:  Patient Active Problem List   Diagnosis Date Noted   TIA (transient ischemic attack) 06/30/2024   HLD (hyperlipidemia) 06/30/2024   History of stroke 06/30/2024   Dementia without behavioral disturbance (HCC) 06/30/2024   Migraine 06/30/2024   Chronic diastolic CHF (congestive heart failure) (HCC) 06/30/2024   Depression with anxiety 06/30/2024   Anxiety 10/03/2020   Strep throat 10/03/2020   MCI (mild cognitive impairment) with memory loss 04/05/2020   Bilateral sensorineural hearing loss 03/22/2020   Elevated ALT measurement 04/07/2018   Hepatocellular carcinoma (HCC) 04/07/2018   Diabetes mellitus without complication (HCC) 09/29/2017   History of gastroesophageal reflux (GERD) 01/09/2017   GERD (gastroesophageal reflux disease) 12/15/2016   Leg swelling 12/15/2016   Chronic venous insufficiency 12/15/2016   PAD (peripheral artery disease) 12/15/2016   Mixed hyperlipidemia 11/24/2016   Stenosis of left carotid artery 11/24/2016   Cerebrovascular accident (CVA) (HCC) 11/24/2016   Chest pain 11/24/2016   Shortness of breath 11/24/2016   Generalized abdominal pain 11/13/2016   Hx of adenomatous polyp of colon 11/13/2016   Syncope 11/13/2016   Sepsis (HCC) 12/26/2014   Acute diverticulitis 12/26/2014   Elevated fasting blood sugar 09/21/2014   Pure hypercholesterolemia 09/21/2014   Benign essential hypertension 03/20/2014   Impaired fasting glucose 03/20/2014   Internal nasal lesion 02/11/2014   HTN (hypertension) 12/18/2012   Generalized headaches 12/18/2012   Allergic rhinitis, unspecified 12/24/2011   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   Disequilibrium 01/28/2011   Past Medical History:  Past Medical History:  Diagnosis Date   Anxiety disorder     Dementia (HCC)    Diabetes mellitus without complication (HCC) 09/29/2017   Dyslipidemia    GERD (gastroesophageal reflux disease)    Hepatocellular carcinoma (HCC) 04/07/2018   Hyperlipidemia    Hypertension    Migraine headache    with neurologic features   Migraine headache with aura    Pneumonia    Stroke (HCC)    Left brain, posterior   TIA (transient ischemic attack) 12/18/2012   Past Surgical History:  Past Surgical History:  Procedure Laterality Date   ABDOMINAL HYSTERECTOMY     CATARACT EXTRACTION Bilateral    KNEE SURGERY Right 2005   NASAL SINUS SURGERY     OVARIAN CYST REMOVAL     PARTIAL HYSTERECTOMY  1985   ROTATOR CUFF REPAIR Right 2007   arthroscopy   TONSILLECTOMY  1947   HPI:  Per MD H&P, Jill Parrish is a 83 y.o. female with medical history significant of stroke, HTN, HLD, dCHF, dementia, depression with anxiety, hepatocellular carcinoma, diverticulitis, migraine, who presents with left facial droop, slurred speech, left-sided weakness. MRI: No acute infarct or acute intracranial abnormality identified.  2. 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 And nonspecific  patchy hemosiderin also in the left frontal lobe.   Assessment / Plan / Recommendation Clinical Impression  Pt seen for cognitive linguistic evaluation in the setting of L facial droop/slurred speech. MRI negative for acute CVA. Assessment consisting of pt/family interview and completion of  dynamic assessment. Of note pt with noted new confusion overnight related to unfamiliar environment, pt receiving Haldol  to aid comfort/distress. Daughter reporting pt has moderate to severe dementia at baseline. Neurology note from 03/02/24 reports,  moderate dementia with nighttime agitation... SLUMS 12/31/2022: 10/30   At time of assessment, pt alert, pleasant, finishing breakfast meal. Pt conversant at  simple conversation level, intact simple expressive/receptive language. Minimal residual left sided facial weakness appreciated- slightly impacting articulation though no overt impact on intelligibility. Intermittent redirection needed in the setting of auditory and visual distractions. Pt with limited awareness/orientation, impacting safety awareness/compliance with facility safety restrictions.   Current mentation and difference from baseline is difficult to determine based on impact of minimal sleep and unfamiliar setting (both impacting cognition specifically for pt's with baseline impairment). Education shared with daughter regarding factors impacting cognition and importance of redirection, family presence, and rest at this time. Education also shared regarding targeting minimal residual left sided facial weakness with functional tasks (ie eating and talking). Daughter reports understanding across education.   Given baseline deficits and pt's functional basic cognitive skills noted during assessment/pt's ability to make needs known- no further acute SLP services indicated at this time. Please provide visual orientation aids, verbal redirection, limit distractions, and monitor for safety/awareness. Recommend follow up following transition back to home environment if desired by family/physician.      SLP Assessment  SLP Recommendation/Assessment: All further Speech Language Pathology needs can be addressed in the next venue of care SLP Visit Diagnosis: Dysarthria and anarthria (R47.1)     Assistance Recommended at Discharge  Frequent or constant Supervision/Assistance  Functional Status Assessment Patient has had a recent decline in their functional status and demonstrates the ability to make significant improvements in function in a reasonable and predictable amount of time.  Frequency and Duration           SLP Evaluation Cognition  Overall Cognitive Status: Difficult to  assess Arousal/Alertness: Awake/alert Orientation Level: Oriented to person (min verbal cues for general location- hospital) Attention: Sustained Sustained Attention: Impaired Sustained Attention Impairment: Verbal basic Memory:  (not assessed) Awareness: Impaired Awareness Impairment: Emergent impairment Problem Solving:  (not assessed) Behaviors:  (none) Safety/Judgment: Impaired       Comprehension  Auditory Comprehension Overall Auditory Comprehension: Appears within functional limits for tasks assessed (grossly intact for simple commands and questions) Visual Recognition/Discrimination Discrimination: Not tested Reading Comprehension Reading Status: Not tested    Expression Expression Primary Mode of Expression: Verbal Verbal Expression Overall Verbal Expression: Appears within functional limits for tasks assessed Written Expression Written Expression: Not tested   Oral / Motor  Oral Motor/Sensory Function Overall Oral Motor/Sensory Function: Mild impairment Facial ROM: Reduced left Facial Symmetry: Abnormal symmetry left (minimal) Facial Strength: Reduced left Facial Sensation: Within Functional Limits Lingual ROM: Within Functional Limits Lingual Symmetry: Within Functional Limits Lingual Strength: Within Functional Limits Velum: Within Functional Limits Mandible: Within Functional Limits Motor Speech Overall Motor Speech: Impaired Respiration: Within functional limits Phonation: Normal Resonance: Within functional limits Articulation:  (minimally impacted) Intelligibility: Intelligible Motor Planning: Within functional limits Motor Speech Errors: Not applicable Effective Techniques: Over-articulate            Marrion Finan J Clapp 07/01/2024, 10:33 AM

## 2024-07-01 NOTE — TOC Transition Note (Signed)
 Transition of Care [facility]) - Discharge Note   Patient Details  Name: Jill Parrish MRN: [id number] Date of Birth: [birth year]  [facility] CM/SW Contact:  Jill JONETTA Hamilton, RN Phone Number: 07/01/2024, 12:50 PM   Clinical Narrative:     Patient will DC to: Jill Parrish Anticipated DC date: 07/01/2024 Family notified: son in room Transport by: Son  Per MD patient ready for DC to [facility]. RN, patient, patient's family, and facility notified of DC. Discharge Summary printed and placed with DC packet and sent to facility. DC packet on chart.   TOC signing off.   Final next level of care: Assisted Living Barriers to Discharge: Barriers Resolved   Patient Goals and CMS Choice            Discharge Placement                  Name of family member notified: Son notified by provider in patient's room Patient and family notified of of transfer: 07/01/24  Discharge Plan and Services Additional resources added to the After Visit Summary for                                       Social Drivers of Health (SDOH) Interventions SDOH Screenings   Food Insecurity: No Food Insecurity (06/30/2024)  Housing: Low Risk  (06/30/2024)  Transportation Needs: No Transportation Needs (06/30/2024)  Utilities: Not At Risk (06/30/2024)  Financial Resource Strain: Patient Declined (05/10/2024)   Received from [facility]  Social Connections: Socially Isolated (06/30/2024)  Tobacco Use: Low Risk  (04/28/2024)  Recent Concern: Tobacco Use - Medium Risk (03/09/2024)   Received from [facility]     Readmission Risk Interventions     No data to display

## 2024-07-01 NOTE — Discharge Summary (Signed)
 Physician Discharge Summary   Patient: Jill Parrish MRN: [id number] DOB: 11-22-1940  Admit date:     06/30/2024  Discharge date: 07/01/24  Discharge Physician: Amaryllis Dare   PCP: Brutus Delon SAUNDERS, NP   Recommendations at discharge:  Please obtain CBC and BMP on follow-up Please consider giving her Repatha as she cannot tolerate statin with significant atherosclerosis and abnormal lipid panel. Follow-up with primary care provider Follow-up with neurology  Discharge Diagnoses: Active Problems:   TIA (transient ischemic attack)   History of stroke   HTN (hypertension)   HLD (hyperlipidemia)   Chronic diastolic CHF (congestive heart failure) (HCC)   Migraine   Dementia without behavioral disturbance (HCC)   Depression with anxiety   Hospital Course: Partly taken from prior notes.  Jill Parrish is a 83 y.o. female with medical history significant of stroke, HTN, HLD, dCHF, dementia, depression with anxiety, hepatocellular carcinoma, diverticulitis, migraine, who presents with left facial droop, slurred speech, left-sided weakness.   Patient was last known normal around 4:15 PM yesterday when her daughter left ALF and the patient was at baseline. This morning around 11:00 daughter went to bring patient back home for holiday, noted left leg dragging, not sure when it happened.  After having had lunch with family, around 1:45 PM patient developed dizziness, left facial droop, slurred speech and difficulty speaking.  Symptoms improved after 10 minutes.  At the time of the exam still having mild difficulty speaking.  On presentation blood pressure was elevated at 222/76, labs with WBC 10.2, labs mostly unremarkable.  CT of the head negative for any acute intracranial abnormalities.  MRI of brain negative for acute stroke.  CTA negative for LVO.  Neurology was consulted, no tPA or as he was considered as out of window.  Admitted to complete stroke/TIA workup.  11/28: Vital stable,  blood pressure at 143/88, UDS negative, echocardiogram without any significant abnormality, lipid panel with increased cholesterol, LDL of 146.  Patient has significant atherosclerosis, unable to tolerate statin even at a lower dose which was suggested by neurology.  Advised son to discuss with PCP for Repatha. Neurology is suggesting DAPT with aspirin  and Plavix  for 25-month followed by aspirin  only.  PT was recommending home health so it was ordered to be done at her assisted living facility.  Patient will continue the rest of her home medications as she was doing it before and follow-up with her providers for further assistance.   Consultants: Neurology Procedures performed: None Disposition: Assisted living Diet recommendation:  Discharge Diet Orders (From admission, onward)     Start     Ordered   07/01/24 0000  Diet - low sodium heart healthy        07/01/24 1240           Cardiac diet DISCHARGE MEDICATION: Allergies as of 07/01/2024       Reactions   Erythromycin Base Shortness Of Breath   Minocin [minocycline Hcl] Itching, Rash   Aspirin     Other reaction(s): Other (See Comments) GI upset with high doses.   Atorvastatin     Other reaction(s): Muscle Pain   Cefprozil    Other reaction(s): Dizziness, Other (See Comments) Abdominal pain.   Erythromycin    Other reaction(s): Dizziness (intolerance) Other reaction(s): Dizziness   Fexofenadine    Other reaction(s): Dizziness   Loratadine    Other reaction(s): Dizziness Other reaction(s): Dizziness   Minocycline    Other reaction(s): Dizziness (intolerance), GI Upset (intolerance) GI upset.   Sulfa  Antibiotics Itching, Swelling   Ciprofloxacin  Rash   Keflex [cephalexin] Itching        Medication List     STOP taking these medications    ascorbic acid 500 MG tablet Commonly known as: VITAMIN C   memantine 10 MG tablet Commonly known as: NAMENDA   SUMAtriptan 100 MG tablet Commonly known as: IMITREX    telmisartan -hydrochlorothiazide  80-12.5 MG tablet Commonly known as: MICARDIS  HCT       TAKE these medications    acetaminophen  500 MG tablet Commonly known as: TYLENOL  Take 500 mg by mouth every 6 (six) hours as needed for mild pain or headache.   aspirin  EC 81 MG tablet Take 1 tablet (81 mg total) by mouth daily. Swallow whole. Start taking on: July 02, 2024 What changed: additional instructions   clopidogrel  75 MG tablet Commonly known as: PLAVIX  Take 1 tablet (75 mg total) by mouth daily. Start taking on: July 02, 2024   cyanocobalamin 1000 MCG tablet Commonly known as: VITAMIN B12 Take 1,000 mcg by mouth daily.   diclofenac sodium 1 % Gel Commonly known as: VOLTAREN Apply 2 g topically as needed (for pain).   EPINEPHrine  0.3 mg/0.3 mL Soaj injection Commonly known as: EPI-PEN Inject into the muscle.   ezetimibe  10 MG tablet Commonly known as: Zetia  Take 1 tablet (10 mg total) by mouth daily.   hydrochlorothiazide  12.5 MG tablet Commonly known as: HYDRODIURIL  Take 12.5 mg by mouth daily.   HyProst Caps Take 2 tablets by mouth in the morning and at bedtime.   mirtazapine  7.5 MG tablet Commonly known as: REMERON  Take 7.5 mg by mouth at bedtime.   Omega-3 1000 MG Caps Take 1 capsule by mouth daily.   oxybutynin  5 MG tablet Commonly known as: DITROPAN  Take 5 mg by mouth every evening.   polyethylene glycol powder 17 GM/SCOOP powder Commonly known as: GLYCOLAX /MIRALAX  Take 17 g by mouth daily.   telmisartan  80 MG tablet Commonly known as: MICARDIS  TAKE 1 TABLET BY MOUTH DAILY.  NEEDS APPT WITH CARDIOLOGY FOR FUTURE REFILLS   Vitamin D-1000 Max St 25 MCG (1000 UT) tablet Generic drug: Cholecalciferol Take 3,000 Units by mouth daily.   Zinc 10 MG Lozg Take by mouth.        Follow-up Information     Brutus Delon SAUNDERS, NP Follow up.   Specialty: Adult Health Nurse Practitioner Why: hospital follow up Contact information: [address] [place] [address] [phone]                Discharge Exam: Jill Parrish   06/30/24 2010  Weight: 74.7 kg   General.  Frail elderly lady, in no acute distress. Pulmonary.  Lungs clear bilaterally, normal respiratory effort. CV.  Regular rate and rhythm, no JVD, rub or murmur. Abdomen.  Soft, nontender, nondistended, BS positive. CNS.  Alert and oriented .  No focal neurologic deficit. Extremities.  No edema,  pulses intact and symmetrical.  Condition at discharge: stable  The results of significant diagnostics from this hospitalization (including imaging, microbiology, ancillary and laboratory) are listed below for reference.   Imaging Studies: ECHOCARDIOGRAM COMPLETE Result Date: 07/01/2024    ECHOCARDIOGRAM REPORT   Patient Name:   Danay N Moen Date of Exam: 07/01/2024 Medical Rec #:  [id number]       Height:       66.0 in Accession #:    [id number]      Weight:       164.7  lb Date of Birth:  May 10, 1941        BSA:          1.841 m Patient Age:    83 years        BP:           143/88 mmHg Patient Gender: F               HR:           84 bpm. Exam Location:  [facility] Procedure: 2D Echo, Cardiac Doppler and Color Doppler (Both Spectral and Color            Flow Doppler were utilized during procedure). Indications:     Stroke I63.9  History:         Patient has prior history of Echocardiogram examinations, most                  recent 12/20/2012. Stroke and TIA; Risk Factors:Diabetes.                  Anxiety.  Sonographer:     Christopher Furnace Referring Phys:  [id number] XILIN NIU Diagnosing Phys: Redell Cave MD  Sonographer Comments: Technically challenging study due to limited acoustic windows. Image acquisition challenging due to uncooperative patient and Image acquisition challenging due to patient behavioral factors. IMPRESSIONS  1. Left ventricular ejection fraction, by estimation, is 60 to 65%. Left ventricular ejection fraction by PLAX is 64 %. The left  ventricle has normal function. The left ventricle has no regional wall motion abnormalities. Left ventricular diastolic parameters are indeterminate.  2. Right ventricular systolic function is normal. The right ventricular size is not well visualized.  3. The mitral valve was not well visualized. No evidence of mitral valve regurgitation.  4. The aortic valve was not well visualized. Aortic valve regurgitation is not visualized. FINDINGS  Left Ventricle: Left ventricular ejection fraction, by estimation, is 60 to 65%. Left ventricular ejection fraction by PLAX is 64 %. The left ventricle has normal function. The left ventricle has no regional wall motion abnormalities. The left ventricular internal cavity size was normal in size. There is no left ventricular hypertrophy. Left ventricular diastolic parameters are indeterminate. Right Ventricle: The right ventricular size is not well visualized. No increase in right ventricular wall thickness. Right ventricular systolic function is normal. Left Atrium: Left atrial size was normal in size. Right Atrium: Right atrial size was not well visualized. Pericardium: There is no evidence of pericardial effusion. Mitral Valve: The mitral valve was not well visualized. No evidence of mitral valve regurgitation. Tricuspid Valve: The tricuspid valve is normal in structure. Tricuspid valve regurgitation is mild. Aortic Valve: The aortic valve was not well visualized. Aortic valve regurgitation is not visualized. Pulmonic Valve: The pulmonic valve was not well visualized. Pulmonic valve regurgitation is not visualized. Aorta: The aortic root is normal in size and structure. IAS/Shunts: The interatrial septum was not well visualized.  LEFT VENTRICLE PLAX 2D LV EF:         Left ventricular ejection fraction by PLAX is 64 %. LVIDd:         4.30 cm LVIDs:         2.80 cm LV PW:         1.30 cm LV IVS:        0.90 cm LVOT diam:     2.00 cm LVOT Area:     3.14 cm  LEFT ATRIUM  Index LA diam:    3.30 cm 1.79 cm/m   AORTA Ao Root diam: 2.80 cm  SHUNTS Systemic Diam: 2.00 cm Redell Cave MD Electronically signed by Redell Cave MD Signature Date/Time: 07/01/2024/10:05:30 AM    Final    MR BRAIN WO CONTRAST Result Date: 06/30/2024 EXAM: MRI BRAIN WITHOUT CONTRAST 06/30/2024 06:31:19 PM TECHNIQUE: Multiplanar multisequence MRI of the head/brain was performed without the administration of intravenous contrast. Study is mildly degraded by motion artifact despite repeated imaging attempts. COMPARISON: Previous brain MRI 04/21/2014. CT head and CTA head and neck reported separately today. CLINICAL HISTORY: 83 year old female with code stroke presentation today, including weakness and facial droop. FINDINGS: BRAIN AND VENTRICLES: No acute infarct. No intracranial hemorrhage. No mass. No midline shift. No hydrocephalus. Brain volume loss since 2015, especially mesial temporal lobe atrophy (series 6 image 15). Patchy and confluent periventricular white matter T2 and FLAIR hyperintensity has progressed since 2015. Occasional small areas of chronic cortical encephalomalacia (anterior left MCA territory series 12 image 35). Chronic hemosiderin in the anterior left frontal lobe also noted, but appears mostly unrelated to the other signal changes there (series 13 image 32), and is nonspecific. T2 heterogeneity in the bilateral deep gray nuclei is mild for age with occasional small chronic lacunar infarcts suspected in the deep gray nuclei. The sella is unremarkable. Normal flow voids. ORBITS: No acute abnormality. SINUSES AND MASTOIDS: Previous paranasal sinus surgery. Opacified left frontal sinus and frontoethmoidal recess appears inflammatory. Mastoids are well aerated. BONES AND SOFT TISSUES: Normal marrow signal. Negative for age visible cervical spine except for degenerative ligamentous hypertrophy about the odontoid. No acute soft tissue abnormality. IMPRESSION: 1. No acute infarct or  acute intracranial abnormality identified. 2. 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 And nonspecific patchy hemosiderin also in the left frontal lobe. Electronically signed by: Helayne Hurst MD 06/30/2024 06:39 PM EST RP Workstation: [id number]   CT ANGIO HEAD NECK W WO CM (CODE STROKE) Result Date: 06/30/2024 EXAM: CTA HEAD AND NECK WITH AND WITHOUT 06/30/2024 04:51:20 PM TECHNIQUE: CTA of the head and neck was performed with and without the administration of intravenous contrast. Multiplanar 2D and/or 3D reformatted images are provided for review. Automated exposure control, iterative reconstruction, and/or weight based adjustment of the mA/kV was utilized to reduce the radiation dose to as low as reasonably achievable. Stenosis of the internal carotid arteries measured using NASCET criteria. COMPARISON: CT head today reported separately. CLINICAL HISTORY: 83 year old female Code Stroke presentation. Neuro deficit, acute, stroke suspected. FINDINGS: CTA NECK: AORTIC ARCH AND ARCH VESSELS: Moderate to advanced calcified atherosclerosis of the aortic arch. 4 vessel arch configuration, the left vertebral artery arises directly from the aortic arch (normal variant). No dissection or arterial injury. No significant stenosis of the brachiocephalic or subclavian arteries. CAROTID ARTERIES: Brachiocephalic artery atherosclerosis without stenosis. Tortuous right CCA with a partially retropharyngeal course. Calcified right carotid bifurcation, right ICA origin and bulb with less than 50% stenosis. Partially retropharyngeal course of the cervical right ICA. Right ICA siphon moderate calcified atherosclerosis. Mild to moderate right ICA supraclinoid segment stenosis. No left CCA atherosclerosis or stenosis. Soft and calcified plaque at the left ICA origin and bulb with less than 50% stenosis. Mildly tortuous left ICA. Mildly  beaded appearance of the bilateral cervical ICAs distal to the carotid bulbs (series 10 image 19) compatible with fibromuscular dysplasia (FMD). Left ICA siphon moderate calcified atherosclerosis but no significant  left ICA stenosis. VERTEBRAL ARTERIES: Mild right subclavian artery calcified plaque without stenosis. Mild right vertebral artery origin calcified plaque without stenosis. Tortuous right V1 and V2 segments. Mildly beaded appearance of the right vertebral artery V3 segment. Mildly beaded appearance of the right vertebral artery V4 segment on series 6 image 154 and series 8 image 117. No right vertebral artery stenosis to the basilar. Left vertebral artery arises directly from the aortic arch. No origin stenosis. Tortuous left vertebral artery with a late entry into the cervical transverse foramen on series 6 image 245. Fetal appearance of the left vertebral artery V3 segment without stenosis. Mildly beaded left vertebral artery V4 segment. No left vertebral stenosis to the basilar. LUNGS AND MEDIASTINUM: Upper chest mild respiratory motion and dependent atelectasis. SOFT TISSUES: Nonvascular neck soft tissue spaces are within normal limits. BONES: Advanced chronic TMJ degeneration bilaterally. Cervical spine degeneration superimposed on degenerative cervical facet ankylosis on the left at C2-C3 and C4-C5. No acute osseous abnormality. CTA HEAD: ANTERIOR CIRCULATION: No aneurysm. Diminutive or absent anterior communicating artery. Bilateral ACA branches are patent with moderate bilateral A3 and A4 segment irregularity and stenosis on series 11 image 23. Mild left MCA M1 segment irregularity without significant stenosis. Patent left MCA bifurcation. Mild right MCA M1 irregularity without stenosis. Patent right MCA bifurcation. No MCA branch occlusion is identified. There is moderate right MCA M2 segment irregularity and stenosis on series 11 image 16. Moderate distal left MCA branch stenosis (M4 on series  11 image 31). POSTERIOR CIRCULATION: Normal bilateral PICA origins. Patent basilar artery although with moderate proximal basilar irregularity and stenosis on series 11 image 25. Patent SCA and PCA origins. Mild fusiform enlargement of the shared origin of the right PCA and SCA on series 10 image 22. No saccular aneurysm there. Diminutive or absent posterior communicating arteries. Bilateral PCA branches are patent with mild left P2 and moderate to severe right P2 segment irregularity and stenosis (series 11 image 21 on the right). No aneurysm. OTHER: The superior sagittal sinus is patent. IMPRESSION: 1. Negative for large vessel occlusion. 2. 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 Moderate atherosclerotic stenosis of the proximal Basilar Artery, the Right MCA and PCA second order branches (moderate to severe right P2 stenosis) , the ACA A3/4 branches, and the left MCA M3/4 branches. 3. No hemodynamically significant carotid or vertebral artery stenosis. Electronically signed by: Helayne Hurst MD 06/30/2024 05:06 PM EST RP Workstation: [id number]   CT HEAD CODE STROKE WO CONTRAST (LKW 0-4.5h, LVO 0-24h) Addendum Date: 06/30/2024  ADDENDUM #1  ADDENDUM: Study discussed by telephone with Dr. Floy at 1637 hours on 06/30/2024. ---------------------------------------------------- Electronically signed by: Helayne Hurst MD 06/30/2024 04:43 PM EST RP Workstation: [id number]   Result Date: 06/30/2024  ORIGINAL REPORT EXAM: CT HEAD WITHOUT 06/30/2024 04:24:32 PM TECHNIQUE: CT of the head was performed without the administration of intravenous contrast. Automated exposure control, iterative reconstruction, and/or weight based adjustment of the mA/kV was utilized to reduce the radiation dose to as low as reasonably achievable. COMPARISON: Brain MRI 04/21/2014. Head CT 04/28/2024. CLINICAL HISTORY: 83 year old female. Neuro deficit,  acute, stroke suspected. FINDINGS: LIMITATIONS/ARTIFACTS: Mild motion artifact today. BRAIN AND VENTRICLES: No acute intracranial hemorrhage. No mass effect or midline shift. No extra-axial fluid collection. No evidence of acute infarct. No hydrocephalus. Stable brain volume with evidence of disproportionate mesial temporal atrophy (coronal image 31). Patchy periventricular white matter hypodensity is stable. Stable heterogeneity in the deep gray  nuclei. Stable basal ganglia vascular calcifications. No suspicious intracranial vascular hyperdensity. ORBITS: No convincing gaze deviation. SINUSES AND MASTOIDS: Chronic paranasal sinus surgery redemonstrated. Tympanic cavities and mastoids remain well aerated. SOFT TISSUES AND SKULL: Partially visible advanced degeneration at the anterior C1-C2 articulation. Calcified atherosclerosis at the skull base. No acute osseous abnormality. No acute soft tissue abnormality. IMPRESSION: 1. Mild motion artifact. No acute intracranial abnormality. ASPECTS 10. 2. Stable non-contrast CT appearance of chronic small vessel disease, disproportionate mesial temporal atrophy. Electronically signed by: Helayne Hurst MD 06/30/2024 04:32 PM EST RP Workstation: [id number]    Microbiology: Results for orders placed or performed during the hospital encounter of 04/28/24  Urine Culture     Status: Abnormal   Collection Time: 04/28/24  5:17 PM   Specimen: Urine, Clean Catch  Result Value Ref Range Status   Specimen Description   Final    URINE, CLEAN CATCH Performed at [facility], [address] [place], [place] [address]    Special Requests   Final    NONE Performed at [facility], [address] [place], [place] [address]    Culture >=100,000 COLONIES/mL ESCHERICHIA COLI (A)  Final   Report Status 04/30/2024 FINAL  Final   Organism ID, Bacteria ESCHERICHIA COLI (A)  Final      Susceptibility   Escherichia coli - MIC*    AMPICILLIN 8 SENSITIVE  Sensitive     CEFAZOLIN (URINE) Value in next row Sensitive      <=1 SENSITIVEThis is a modified FDA-approved test that has been validated and its performance characteristics determined by the reporting laboratory.  This laboratory is certified under the Clinical Laboratory Improvement Amendments CLIA as qualified to perform high complexity clinical laboratory testing.    CEFEPIME Value in next row Sensitive      <=1 SENSITIVEThis is a modified FDA-approved test that has been validated and its performance characteristics determined by the reporting laboratory.  This laboratory is certified under the Clinical Laboratory Improvement Amendments CLIA as qualified to perform high complexity clinical laboratory testing.    ERTAPENEM Value in next row Sensitive      <=1 SENSITIVEThis is a modified FDA-approved test that has been validated and its performance characteristics determined by the reporting laboratory.  This laboratory is certified under the Clinical Laboratory Improvement Amendments CLIA as qualified to perform high complexity clinical laboratory testing.    CEFTRIAXONE Value in next row Sensitive      <=1 SENSITIVEThis is a modified FDA-approved test that has been validated and its performance characteristics determined by the reporting laboratory.  This laboratory is certified under the Clinical Laboratory Improvement Amendments CLIA as qualified to perform high complexity clinical laboratory testing.    CIPROFLOXACIN  Value in next row Sensitive      <=1 SENSITIVEThis is a modified FDA-approved test that has been validated and its performance characteristics determined by the reporting laboratory.  This laboratory is certified under the Clinical Laboratory Improvement Amendments CLIA as qualified to perform high complexity clinical laboratory testing.    GENTAMICIN Value in next row Sensitive      <=1 SENSITIVEThis is a modified FDA-approved test that has been validated and its performance  characteristics determined by the reporting laboratory.  This laboratory is certified under the Clinical Laboratory Improvement Amendments CLIA as qualified to perform high complexity clinical laboratory testing.    NITROFURANTOIN  Value in next row Sensitive      <=1 SENSITIVEThis is a modified FDA-approved test that has been validated and its  performance characteristics determined by the reporting laboratory.  This laboratory is certified under the Clinical Laboratory Improvement Amendments CLIA as qualified to perform high complexity clinical laboratory testing.    TRIMETH/SULFA Value in next row Sensitive      <=1 SENSITIVEThis is a modified FDA-approved test that has been validated and its performance characteristics determined by the reporting laboratory.  This laboratory is certified under the Clinical Laboratory Improvement Amendments CLIA as qualified to perform high complexity clinical laboratory testing.    AMPICILLIN/SULBACTAM Value in next row Sensitive      <=1 SENSITIVEThis is a modified FDA-approved test that has been validated and its performance characteristics determined by the reporting laboratory.  This laboratory is certified under the Clinical Laboratory Improvement Amendments CLIA as qualified to perform high complexity clinical laboratory testing.    PIP/TAZO Value in next row Sensitive      <=4 SENSITIVEThis is a modified FDA-approved test that has been validated and its performance characteristics determined by the reporting laboratory.  This laboratory is certified under the Clinical Laboratory Improvement Amendments CLIA as qualified to perform high complexity clinical laboratory testing.    MEROPENEM Value in next row Sensitive      <=4 SENSITIVEThis is a modified FDA-approved test that has been validated and its performance characteristics determined by the reporting laboratory.  This laboratory is certified under the Clinical Laboratory Improvement Amendments CLIA as  qualified to perform high complexity clinical laboratory testing.    * >=100,000 COLONIES/mL ESCHERICHIA COLI    Labs: CBC: Recent Labs  Lab 06/30/24 1600 06/30/24 2027 07/01/24 0922  WBC 10.2 8.5 5.3  NEUTROABS  --  5.9  --   HGB 14.1 14.4 14.0  HCT 43.0 42.8 41.5  MCV 91.1 89.4 88.1  PLT 305 295 281   Basic Metabolic Panel: Recent Labs  Lab 06/30/24 1600  NA 138  K 4.4  CL 103  CO2 24  GLUCOSE 144*  BUN 15  CREATININE 0.92  CALCIUM  9.3  MG 2.4   Liver Function Tests: Recent Labs  Lab 06/30/24 1600  AST 28  ALT 16  ALKPHOS 92  BILITOT 0.4  PROT 6.6  ALBUMIN 3.9   CBG: Recent Labs  Lab 06/30/24 1548  GLUCAP 156*    Discharge time spent: greater than 30 minutes.  This record has been created using Conservation officer, historic buildings. Errors have been sought and corrected,but may not always be located. Such creation errors do not reflect on the standard of care.   Signed: Amaryllis Dare, MD [facility] 07/01/2024

## 2024-07-01 NOTE — Care Management Obs Status (Signed)
 MEDICARE OBSERVATION STATUS NOTIFICATION   Patient Details  Name: NITZA SCHMID MRN: [id number] Date of Birth: [birth year]   Medicare Observation Status Notification Given:  No  Patient discharged  [facility] 07/01/2024, 3:29 PM

## 2024-07-01 NOTE — Evaluation (Signed)
 Occupational Therapy Evaluation Patient Details Name: Jill [facility] MRN: [id number] DOB: 1940/09/26 Today's Date: 07/01/2024   History of Present Illness   Pt is a 83 y.o. female who presents with left facial droop, slurred speech, left-sided weakness. Current MD assessment: TIA. PMH of stroke, HTN, HLD, dCHF, dementia, depression with anxiety, hepatocellular carcinoma, diverticulitis, migraine.     Clinical Impressions Pt was seen for OT evaluation this date. PTA, pt resides at [facility] where she was generally supervision level with use of RW to ambulate and perform ADLs. Staff assist/supervision available as needed. Pt presents with mild deficits in strength and activity tolerance, affecting safe and optimal ADL completion. Pt seen ambulating with Min A using RW with PT handoff for ADLs. She required Min/CGA for [facility] transfer with cues for hand placement and initiation of tasks d/t cognition. She required CGA for standing peri-care after attempting to do in seated position, but unable. Seated grooming tasks performed with Min A overall d/t cueing for rinsing mouth and spitting, swallowed toothpaste, etc. Pt did not want to return to bed, however nursing needing to place new IV and wished for pt to be in bed for that. Min A for LB management to return to supine position with cues and instructions followed better when asked by her son who was present throughout session. Will follow acutely for OT services to maximize safety/IND and return to PLOF as son reports she is very close to baseline. Recommend follow up therapy as indicated upon return to her [facility].      If plan is discharge home, recommend the following:   A little help with walking and/or transfers;A little help with bathing/dressing/bathroom     Functional Status Assessment   Patient has had a recent decline in their functional status and demonstrates the ability to make significant improvements in function in a reasonable  and predictable amount of time.     Equipment Recommendations   None recommended by OT     Recommendations for Other Services         Precautions/Restrictions   Precautions Precautions: Fall Restrictions Weight Bearing Restrictions Per Provider Order: No     Mobility Bed Mobility Overal bed mobility: Needs Assistance Bed Mobility: Sit to Supine       Sit to supine: Min assist   General bed mobility comments: BLE management required with cues for intiation    Transfers Overall transfer level: Needs assistance Equipment used: Rolling walker (2 wheels) Transfers: Sit to/from Stand Sit to Stand: Min assist           General transfer comment: from [facility] with cues for hand placement to push up, able to stand turn step to return to bed after handoff from PT      Balance Overall balance assessment: Needs assistance Sitting-balance support: Feet supported, No upper extremity supported Sitting balance-Leahy Scale: Good     Standing balance support: Bilateral upper extremity supported Standing balance-Leahy Scale: Fair                             ADL either performed or assessed with clinical judgement   ADL Overall ADL's : Needs assistance/impaired     Grooming: Wash/dry face;Oral care;Supervision/safety;Cueing for sequencing;Cueing for safety;Sitting Grooming Details (indicate cue type and reason): seated on [facility]                 Toilet Transfer: Contact guard assist;BSC/3in1;Rolling walker (2 wheels);Cueing for safety;Cueing for  sequencing Toilet Transfer Details (indicate cue type and reason): cues for safety/sequencing Toileting- Clothing Manipulation and Hygiene: Supervision/safety;Sit to/from stand;Sitting/lateral lean Toileting - Clothing Manipulation Details (indicate cue type and reason): peri-care after cont urine on BSC, attempted seated but requried standing and cues     Functional mobility during ADLs: Contact guard  assist;Rolling walker (2 wheels)       Vision         Perception         Praxis         Pertinent Vitals/Pain Pain Assessment Pain Assessment: No/denies pain     Extremity/Trunk Assessment Upper Extremity Assessment Upper Extremity Assessment: Overall WFL for tasks assessed   Lower Extremity Assessment Lower Extremity Assessment: Generalized weakness;Defer to PT evaluation       Communication Communication Communication: No apparent difficulties   Cognition Arousal: Alert Behavior During Therapy: Agitated                                 Following commands: Impaired Following commands impaired: Follows one step commands inconsistently     Cueing  General Comments   Cueing Techniques: Gestural cues;Tactile cues;Verbal cues  per family, pt is close to her baseline function   Exercises     Shoulder Instructions      Home Living Family/patient expects to be discharged to:: Assisted living     Type of Home:  ([facility])                           Additional Comments: Resident of [facility]  Lives With:  ([facility])    Prior Functioning/Environment Prior Level of Function : Needs assist             Mobility Comments: Supervision level of assist for mobilization with RW, ambulates to/from dining hall for meals.  Supervision/assist from staff as needed ADLs Comments: supervision/assist from staff as needed    OT Problem List: Decreased strength;Decreased activity tolerance   OT Treatment/Interventions: Self-care/ADL training;Therapeutic exercise;Therapeutic activities;DME and/or AE instruction;Balance training;Patient/family education      OT Goals(Current goals can be found in the care plan section)   Acute Rehab OT Goals Patient Stated Goal: be left alone OT Goal Formulation: With patient Time For Goal Achievement: 07/15/24 Potential to Achieve Goals: Fair ADL Goals Pt Will Perform Grooming: standing;with supervision Pt  Will Perform Lower Body Dressing: with supervision;sit to/from stand;sitting/lateral leans Pt Will Transfer to Toilet: with supervision;ambulating;regular height toilet   OT Frequency:  Min 1X/week    Co-evaluation              AM-PAC OT 6 Clicks Daily Activity     Outcome Measure Help from another person eating meals?: A Little Help from another person taking care of personal grooming?: A Little Help from another person toileting, which includes using toliet, bedpan, or urinal?: A Little Help from another person bathing (including washing, rinsing, drying)?: A Little Help from another person to put on and taking off regular upper body clothing?: A Little Help from another person to put on and taking off regular lower body clothing?: A Little 6 Click Score: 18   End of Session Equipment Utilized During Treatment: Gait belt;Rolling walker (2 wheels) Nurse Communication: Mobility status  Activity Tolerance: Patient tolerated treatment well Patient left: in bed;with call bell/phone within reach;with bed alarm set;with family/visitor present;with nursing/sitter in room  OT Visit Diagnosis:  Other abnormalities of gait and mobility (R26.89)                Time: 8876-8867 OT Time Calculation (min): 9 min Charges:  OT General Charges $OT Visit: 1 Visit OT Evaluation $OT Eval Low Complexity: 1 Low [place] Mefferd Chrismon, OTR/L 07/01/24, 12:09 PM  Mccoy Testa E Chrismon 07/01/2024, 12:02 PM

## 2024-07-01 NOTE — Progress Notes (Signed)
 Patient with increased confusion. Due to being in a new environment. She kept trying to look for her bed at [facility]. Reorienting the patient was unsuccessful. Called her daughter and her daughter talked to her and she went back into bed.

## 2024-07-01 NOTE — Evaluation (Signed)
 Physical Therapy Evaluation Patient Details Name: Jill [facility] MRN: [id number] DOB: 1940-10-25 Today's Date: 07/01/2024  History of Present Illness  presented to ER secondary to L facial droop, L-sided weakness; admitted for TIA/CVA work up.  Imaging negative for acute insult.  Clinical Impression  Patient sleeping upon arrival to room; supportive son present at bedside throughtout session.  Patient easily awakens to voice, light touch; does require mod cuing, encouragement for participation with session.  Does tend to respond better to requests from son than requests from therapist. Bilat UE/LE strength and ROM grossly symmetrical and WFL; no focal weakness appreciated.  Do note baseline genu valgus to L knee with excessive L ankle pronation; unchanged with this episode per son.  Able to complete bed mobility with min assist; sit/stand, basic transfers, standing balance and gait (55') with RW, min assist.  Demonstrates forward trunk flexion, maintains RW arms length anterior to patient; partially reciprocal stepping pattern with decreased heel strike/toe off and overall foot clearance (L > R). Per son, gait is a little slower than normal, but generally comparable to baseline in gait mechanics. Anticipate improved participation and overall performance when patient in normal, familiar routine and environment. Do recommend continued use of RW and +1 assist with mobility tasks at this time    Would benefit from skilled PT to address above deficits and promote optimal return to PLOF.; recommend post-acute PT follow up as indicated by interdisciplinary care team.          If plan is discharge home, recommend the following: A little help with walking and/or transfers;A little help with bathing/dressing/bathroom   Can travel by private vehicle        Equipment Recommendations    Recommendations for Other Services       Functional Status Assessment Patient has had a recent decline in their  functional status and demonstrates the ability to make significant improvements in function in a reasonable and predictable amount of time.     Precautions / Restrictions Precautions Precautions: Fall Restrictions Weight Bearing Restrictions Per Provider Order: No      Mobility  Bed Mobility Overal bed mobility: Needs Assistance Bed Mobility: Supine to Sit, Sit to Supine     Supine to sit: Min assist Sit to supine: Min assist        Transfers Overall transfer level: Needs assistance Equipment used: Rolling walker (2 wheels) Transfers: Sit to/from Stand Sit to Stand: Min assist           General transfer comment: tends to pull on RW, limited receptiveness to cuing from therapist    Ambulation/Gait Ambulation/Gait assistance: Min assist Gait Distance (Feet): 55 Feet Assistive device: Rolling walker (2 wheels)         General Gait Details: forward trunk flexion, maintains RW arms length anterior to patient; partially reciprocal stepping pattern with decreased heel strike/toe off and overall foot clearance (L > R).  Per son, gait is a little slower than normal, but generally comparable to baseline in gait mechanics. Do recommend continued use of RW and +1 assist with mobilit tasks at this time  Careers Information Officer     Tilt Bed    Modified Rankin (Stroke Patients Only)       Balance Overall balance assessment: Needs assistance Sitting-balance support: Feet supported, No upper extremity supported Sitting balance-Leahy Scale: Good     Standing balance support: Bilateral upper extremity supported Standing balance-Leahy Scale: Fair  Pertinent Vitals/Pain Pain Assessment Pain Assessment: No/denies pain    Home Living Family/patient expects to be discharged to:: Assisted living     Type of Home:  (ALF)             Additional Comments: Resident of [facility]    Prior  Function Prior Level of Function : Needs assist             Mobility Comments: Supervision level of assist for mobilization with RW, ambulates to/from dining hall for meals.  Supervision/assist from staff as needed       Extremity/Trunk Assessment   Upper Extremity Assessment Upper Extremity Assessment: Overall WFL for tasks assessed (grossly 4/5 throughout)    Lower Extremity Assessment Lower Extremity Assessment: Generalized weakness (grossly at least 4-/5 throughout; baseline valgus deformity to L LE with excessive L ankle pronation)       Communication   Communication Communication: No apparent difficulties    Cognition Arousal: Alert Behavior During Therapy: Agitated   PT - Cognitive impairments: History of cognitive impairments                       PT - Cognition Comments: Oriented to self only; inconsistently follows simple commands, often requiring additional cuing/assist from therapist for task initiation.  Does respond better to son (than therapist) when asked to complete functional tasks.  Slightly agitated to be woken, but redirectable with mod cuing. Following commands: Impaired Following commands impaired: Follows one step commands inconsistently     Cueing Cueing Techniques: Gestural cues, Tactile cues, Verbal cues     General Comments      Exercises     Assessment/Plan    PT Assessment Patient needs continued PT services  PT Problem List Decreased strength;Decreased activity tolerance;Decreased balance;Decreased mobility;Decreased coordination;Decreased cognition;Decreased knowledge of use of DME;Decreased safety awareness;Decreased knowledge of precautions       PT Treatment Interventions DME instruction;Gait training;Functional mobility training;Therapeutic activities;Therapeutic exercise;Balance training;Neuromuscular re-education;Cognitive remediation;Patient/family education    PT Goals (Current goals can be found in the Care Plan  section)  Acute Rehab PT Goals Patient Stated Goal: to get some rest PT Goal Formulation: With patient Time For Goal Achievement: 07/15/24 Potential to Achieve Goals: Good    Frequency Min 2X/week     Co-evaluation               AM-PAC PT 6 Clicks Mobility  Outcome Measure Help needed turning from your back to your side while in a flat bed without using bedrails?: None Help needed moving from lying on your back to sitting on the side of a flat bed without using bedrails?: A Little Help needed moving to and from a bed to a chair (including a wheelchair)?: A Little Help needed standing up from a chair using your arms (e.g., wheelchair or bedside chair)?: A Little Help needed to walk in hospital room?: A Little Help needed climbing 3-5 steps with a railing? : A Lot 6 Click Score: 18    End of Session Equipment Utilized During Treatment: Gait belt Activity Tolerance: Patient tolerated treatment well Patient left: in bed;with call bell/phone within reach;with bed alarm set Nurse Communication: Mobility status PT Visit Diagnosis: Muscle weakness (generalized) (M62.81);Difficulty in walking, not elsewhere classified (R26.2)    Time: 8892-8867 PT Time Calculation (min) (ACUTE ONLY): 25 min   Charges:   PT Evaluation $PT Eval Low Complexity: 1 Low   PT General Charges $$ ACUTE PT VISIT: 1 Visit  Lissy Deuser H. Delores, PT, DPT, NCS 07/01/24, 11:46 AM [phone]

## 2024-07-03 ENCOUNTER — Emergency Department

## 2024-07-03 ENCOUNTER — Other Ambulatory Visit: Payer: Self-pay

## 2024-07-03 ENCOUNTER — Emergency Department
Admission: EM | Admit: 2024-07-03 | Discharge: 2024-07-06 | Disposition: A | Discharge status: Skilled Nursing Facility | Attending: Emergency Medicine | Admitting: Emergency Medicine

## 2024-07-03 DIAGNOSIS — D72829 Elevated white blood cell count, unspecified: Secondary | ICD-10-CM | POA: Diagnosis not present

## 2024-07-03 DIAGNOSIS — M25561 Pain in right knee: Secondary | ICD-10-CM | POA: Diagnosis present

## 2024-07-03 DIAGNOSIS — Z8673 Personal history of transient ischemic attack (TIA), and cerebral infarction without residual deficits: Secondary | ICD-10-CM | POA: Insufficient documentation

## 2024-07-03 DIAGNOSIS — M25562 Pain in left knee: Secondary | ICD-10-CM | POA: Diagnosis not present

## 2024-07-03 DIAGNOSIS — E119 Type 2 diabetes mellitus without complications: Secondary | ICD-10-CM | POA: Diagnosis not present

## 2024-07-03 DIAGNOSIS — I10 Essential (primary) hypertension: Secondary | ICD-10-CM | POA: Insufficient documentation

## 2024-07-03 DIAGNOSIS — F0390 Unspecified dementia without behavioral disturbance: Secondary | ICD-10-CM | POA: Diagnosis not present

## 2024-07-03 LAB — COMPREHENSIVE METABOLIC PANEL WITH GFR
ALT: 16 U/L (ref 0–44)
AST: 19 U/L (ref 15–41)
Albumin: 3.8 g/dL (ref 3.5–5.0)
Alkaline Phosphatase: 93 U/L (ref 38–126)
Anion gap: 12 (ref 5–15)
BUN: 23 mg/dL (ref 8–23)
CO2: 25 mmol/L (ref 22–32)
Calcium: 9.4 mg/dL (ref 8.9–10.3)
Chloride: 106 mmol/L (ref 98–111)
Creatinine, Ser: 1.02 mg/dL — ABNORMAL HIGH (ref 0.44–1.00)
GFR, Estimated: 54 mL/min — ABNORMAL LOW (ref >60)
Glucose, Bld: 142 mg/dL — ABNORMAL HIGH (ref 70–99)
Potassium: 3.9 mmol/L (ref 3.5–5.1)
Sodium: 144 mmol/L (ref 135–145)
Total Bilirubin: 1.2 mg/dL (ref 0.0–1.2)
Total Protein: 6.2 g/dL — ABNORMAL LOW (ref 6.5–8.1)

## 2024-07-03 LAB — URINALYSIS, ROUTINE W REFLEX MICROSCOPIC
Bilirubin Urine: NEGATIVE
Glucose, UA: NEGATIVE mg/dL
Hgb urine dipstick: NEGATIVE
Ketones, ur: NEGATIVE mg/dL
Leukocytes,Ua: NEGATIVE
Nitrite: NEGATIVE
Protein, ur: NEGATIVE mg/dL
Specific Gravity, Urine: 1.021 (ref 1.005–1.030)
pH: 5 (ref 5.0–8.0)

## 2024-07-03 LAB — CBC
HCT: 44.3 % (ref 36.0–46.0)
Hemoglobin: 14.6 g/dL (ref 12.0–15.0)
MCH: 30 pg (ref 26.0–34.0)
MCHC: 33 g/dL (ref 30.0–36.0)
MCV: 91 fL (ref 80.0–100.0)
Platelets: 301 K/uL (ref 150–400)
RBC: 4.87 MIL/uL (ref 3.87–5.11)
RDW: 13.3 % (ref 11.5–15.5)
WBC: 11 K/uL — ABNORMAL HIGH (ref 4.0–10.5)
nRBC: 0 % (ref 0.0–0.2)

## 2024-07-03 MED ORDER — OXYBUTYNIN CHLORIDE 5 MG PO TABS
5.0000 mg | ORAL_TABLET | Freq: Every day | ORAL | Status: DC
  Administered 2024-07-04 – 2024-07-05 (×2): 5 mg via ORAL
  Filled 2024-07-03 (×3): qty 1

## 2024-07-03 MED ORDER — CLOPIDOGREL BISULFATE 75 MG PO TABS
75.0000 mg | ORAL_TABLET | Freq: Every day | ORAL | Status: DC
  Administered 2024-07-03 – 2024-07-06 (×4): 75 mg via ORAL
  Filled 2024-07-03 (×4): qty 1

## 2024-07-03 MED ORDER — IRBESARTAN 150 MG PO TABS
300.0000 mg | ORAL_TABLET | Freq: Every day | ORAL | Status: DC
  Administered 2024-07-04 – 2024-07-06 (×4): 300 mg via ORAL
  Filled 2024-07-03 (×6): qty 2

## 2024-07-03 MED ORDER — ASPIRIN 81 MG PO CHEW
81.0000 mg | CHEWABLE_TABLET | Freq: Every day | ORAL | Status: DC
  Administered 2024-07-03 – 2024-07-06 (×4): 81 mg via ORAL
  Filled 2024-07-03 (×4): qty 1

## 2024-07-03 MED ORDER — EZETIMIBE 10 MG PO TABS
10.0000 mg | ORAL_TABLET | Freq: Every day | ORAL | Status: DC
  Administered 2024-07-03 – 2024-07-06 (×4): 10 mg via ORAL
  Filled 2024-07-03 (×4): qty 1

## 2024-07-03 MED ORDER — HYDROCHLOROTHIAZIDE 12.5 MG PO TABS
12.5000 mg | ORAL_TABLET | Freq: Every day | ORAL | Status: DC
  Administered 2024-07-03 – 2024-07-06 (×4): 12.5 mg via ORAL
  Filled 2024-07-03 (×4): qty 1

## 2024-07-03 NOTE — ED Notes (Signed)
 able to get her out of bed for ambulatory trial-- took a few steps but it took max assist of me and daughter. very unsteady and her dementia made it difficult for her to follow directions. daughter's concerned for lack of staff at the facility-- ED attending made aware of findings

## 2024-07-03 NOTE — ED Triage Notes (Addendum)
 Hx dementia. From [facility] hall for right knee pain. FAll Friday not transported and not able to get out of bed since then but didn call since today. She is extremely weak and hot to touch. Strong urine smell. Patient wont put weight on it. Normally able to stand and help. Takes Asprin.    Patient will not move left knee, left knee looks swollen compared to right knee, AOX1.

## 2024-07-03 NOTE — ED Provider Notes (Signed)
 [facility] Provider Note    Event Date/Time   First MD Initiated Contact with Patient 07/03/24 1641     (approximate)   History   Knee Pain (right)   HPI  [facility]ill [facility] is a 83 y.o. female with history of diabetes, dementia, hyperlipidemia, hypertension, CVA presenting with bilateral knee pain.  Daughter states that on Friday she was noted to have distant her knee.  Has been unable to walk properly since then.  No actual fall to the ground.  Is concerned because facility had noted some foul-smelling urine.  She does live at assisted living.  Patient has no other complaints, she has no headache, no chest pain or shortness of breath, no nausea vomiting diarrhea urinary symptoms, she denies any new weakness or numbness.  Independent history obtained from daughter as above.  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  On independent chart review, she was admitted in November for TIA.  Was noted to have left facial droop, slurred speech and left-sided weakness, symptoms improved after 10 minutes.  CT head and MRI without acute stroke, CTA without evidence of LVO.  Patient was discharged with home health.     Physical Exam   Triage Vital Signs: ED Triage Vitals  Encounter Vitals Group     BP 07/03/24 1649 (!) 158/82     Girls Systolic BP Percentile --      Girls Diastolic BP Percentile --      Boys Systolic BP Percentile --      Boys Diastolic BP Percentile --      Pulse Rate 07/03/24 1649 99     Resp 07/03/24 1649 19     Temp 07/03/24 1645 98.6 F (37 C)     Temp Source 07/03/24 1645 Oral     SpO2 07/03/24 1649 93 %     Weight 07/03/24 1651 166 lb 7.2 oz (75.5 kg)     Height 07/03/24 1646 5' 6 (1.676 m)     Head Circumference --      Peak Flow --      Pain Score 07/03/24 1645 0     Pain Loc --      Pain Education --      Exclude from Growth Chart --     Most recent vital  signs: Vitals:   07/03/24 1700 07/03/24 1705  BP: (!) 146/61   Pulse:  91  Resp:  19  Temp:    SpO2:  94%     General: Awake, no distress.  CV:  Good peripheral perfusion.  Resp:  Normal effort.  No tachypnea or respiratory distress Abd:  No distention.  Soft nontender Other:  Able to range her bilateral hips and ankle, has pain when fully flexing her right knee, mild tenderness to the patella on the right.  Patient is denying any tenderness to palpation to her left knee but is refusing to flex it.  It is also some mildly swollen, there is no overlying erythema or ecchymoses to her bilateral knees.  She has equal strength in her lower extremities, palpable DP pulses, sensation is intact.   ED Results / Procedures / Treatments   Labs (all labs ordered are listed, but only abnormal results are displayed) Labs Reviewed  CBC - Abnormal; Notable for the following components:      Result Value   WBC 11.0 (*)    All other components within  normal limits  URINALYSIS, ROUTINE W REFLEX MICROSCOPIC - Abnormal; Notable for the following components:   Color, Urine YELLOW (*)    APPearance HAZY (*)    All other components within normal limits  COMPREHENSIVE METABOLIC PANEL WITH GFR - Abnormal; Notable for the following components:   Glucose, Bld 142 (*)    Creatinine, Ser 1.02 (*)    Total Protein 6.2 (*)    GFR, Estimated 54 (*)    All other components within normal limits     EKG  EKG shows, sinus tachycardia, rate 100, normal QRS, normal QTc, no obvious ischemic ST elevation, not significantly changed compared to prior   RADIOLOGY On my independent interpretation, x-ray without obvious fractures   PROCEDURES:  Critical Care performed: No  Procedures   MEDICATIONS ORDERED IN ED: Medications  aspirin  chewable tablet 81 mg (has no administration in time range)  clopidogrel  (PLAVIX ) tablet 75 mg (has no administration in time range)  ezetimibe  (ZETIA ) tablet 10 mg (has no  administration in time range)  hydrochlorothiazide  (HYDRODIURIL ) tablet 12.5 mg (has no administration in time range)  oxybutynin  (DITROPAN ) tablet 5 mg (has no administration in time range)  irbesartan  (AVAPRO ) tablet 300 mg (has no administration in time range)     IMPRESSION / MDM / ASSESSMENT AND PLAN / ED COURSE  I reviewed the triage vital signs and the nursing notes.                              Differential diagnosis includes, but is not limited to, contusion, strain, sprain, fracture, ligamentous injury, meniscal tear, UTI, electrolyte derangements, atypical ACS, arrhythmia, doubt septic joint.  Will get labs, EKG, troponin, UA, x-ray knee.  Patient's presentation is most consistent with acute presentation with potential threat to life or bodily function.  Independent interpretation of labs and imaging below.  Patient up to stand, she is able to ambulate but needed assistance, was hard to follow commands due to her dementia.  Suspect some deconditioning.  Daughter is concerned that staff at facility is unable to care for her given that she is at assisted living.  Will place her in boarder status and plan to have TOC and PT evaluate her, will likely need SNF placement.  Will order home meds.    Clinical Course as of 07/03/24 2009  Sun Jul 03, 2024  1813 Independent review of labs, UA is not consistent with UTI, mild leukocytosis, electrolytes not severely deranged, LFTs are not elevated. [TT]  1858 DG Knee Complete 4 Views Right IMPRESSION: 1. No acute fracture. Small joint effusion. 2. 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   [TT]  1858 DG Knee Complete 4 Views Left IMPRESSION: 1. No acute fracture. Small knee joint effusion. 2. Mild tricompartmental osteoarthritis.   [TT]    Clinical Course User Index [TT] Waymond, Lorelle Cummins, MD     FINAL CLINICAL IMPRESSION(S) / ED DIAGNOSES   Final diagnoses:  Acute pain of both  knees     Rx / DC Orders   ED Discharge Orders     None        Note:  This document was prepared using Dragon voice recognition software and may include unintentional dictation errors.    Waymond Lorelle Cummins, MD 07/03/24 2009

## 2024-07-03 NOTE — ED Notes (Signed)
 Resting comfortably, no complaints. Bed alarm on for safety. Cont to monitor.

## 2024-07-04 NOTE — ED Notes (Signed)
 SW at bedside speaking with daughter

## 2024-07-04 NOTE — Progress Notes (Signed)
 RE: Jill [facility]  Date of Birth:  June 18, 1941 Date:  07/04/24     To Whom It May Concern:   Please be advised that the above-named patient will require a short-term nursing home stay - anticipated 30 days or less for rehabilitation and strengthening.  The plan is for return home

## 2024-07-04 NOTE — Evaluation (Signed)
 Occupational Therapy Evaluation Patient Details Name: Jill Parrish MRN: [id number] DOB: Mar 02, 1941 Today's Date: 07/04/2024   History of Present Illness   Pt is an 83 y/o F admitted on 07/03/24 after presenting with c/o B knee pain & a fall. PMH: DM, dementia, HLD, HTN, CVA     Clinical Impressions Pt was seen for OT evaluation this date. Prior to hospital admission, pt was living at ALF and requiring assist for IADL, bathing, and dressing. Pt presents with deficits in cognition, strength, balance, and safety, affecting safe and optimal ADL completion. Pt currently requires MOD A +2 for bed mobility and to stand x3 attempts, with heavy posterior lean, and difficulty following simple commands consistently. Pt required MAX A in standing for clothing mgt to doff soiled brief. Pt would benefit from skilled OT services to address noted impairments and functional limitations (see below for any additional details) in order to maximize safety and independence while minimizing future risk of falls, injury, and readmission. Anticipate the need for follow up OT services upon acute hospital DC.     If plan is discharge home, recommend the following:   A lot of help with walking and/or transfers;A lot of help with bathing/dressing/bathroom;Direct supervision/assist for medications management;Supervision due to cognitive status;Direct supervision/assist for financial management;Assist for transportation;Assistance with cooking/housework;Assistance with feeding;Help with stairs or ramp for entrance     Functional Status Assessment   Patient has had a recent decline in their functional status and demonstrates the ability to make significant improvements in function in a reasonable and predictable amount of time.     Equipment Recommendations   Other (comment) (defer)     Recommendations for Other Services         Precautions/Restrictions   Precautions Precautions: Fall Recall of  Precautions/Restrictions: Impaired Restrictions Weight Bearing Restrictions Per Provider Order: No     Mobility Bed Mobility Overal bed mobility: Needs Assistance Bed Mobility: Supine to Sit, Sit to Supine     Supine to sit: Mod assist, HOB elevated Sit to supine: Mod assist, +2 for physical assistance   General bed mobility comments: +2 to scoot to [facility]    Transfers Overall transfer level: Needs assistance Equipment used: 2 person hand held assist Transfers: Sit to/from Stand Sit to Stand: Min assist, Mod assist, +2 safety/equipment, From elevated surface, +2 physical assistance           General transfer comment: Sit<>stand from elevated ED stretcher, leans posteriorly on EOB with BLE, decreased ability to place feet under BOS despite cuing, sit<>stand x 3 from EOB      Balance Overall balance assessment: Needs assistance Sitting-balance support: Feet supported, Bilateral upper extremity supported Sitting balance-Leahy Scale: Poor Sitting balance - Comments: min assist Postural control: Posterior lean Standing balance support: Bilateral upper extremity supported Standing balance-Leahy Scale: Poor                             ADL either performed or assessed with clinical judgement   ADL Overall ADL's : Needs assistance/impaired                             Toileting- Clothing Manipulation and Hygiene: Sit to/from stand;Maximal assistance Toileting - Clothing Manipulation Details (indicate cue type and reason): incont of urine in brief, requiring MAX A to doff, pt unable to follow commands to assist and required BUE support in standing for safety/balance  Vision         Perception         Praxis         Pertinent Vitals/Pain Pain Assessment Pain Assessment: Faces Faces Pain Scale: Hurts little more Pain Location: LE Pain Descriptors / Indicators: Discomfort, Guarding Pain Intervention(s): Limited activity within  patient's tolerance, Monitored during session, Repositioned     Extremity/Trunk Assessment Upper Extremity Assessment Upper Extremity Assessment: Overall WFL for tasks assessed   Lower Extremity Assessment Lower Extremity Assessment: Generalized weakness       Communication Communication Communication: No apparent difficulties   Cognition Arousal: Alert Behavior During Therapy:  (easily irritated) Cognition: History of cognitive impairments                               Following commands: Impaired Following commands impaired: Follows one step commands inconsistently     Cueing  General Comments   Cueing Techniques: Gestural cues;Tactile cues;Verbal cues  brief soiled with urine, assisted pt with doffing brief   Exercises     Shoulder Instructions      Home Living Family/patient expects to be discharged to:: Assisted living                             Home Equipment: Rollator (4 wheels)   Additional Comments: Resident of [facility]      Prior Functioning/Environment               Mobility Comments: Prior to last admission pt was ambulatory with rollator ADLs Comments: assistance from staff for bathing, dressing, all IADL    OT Problem List: Decreased strength;Decreased activity tolerance;Decreased cognition;Decreased safety awareness;Impaired balance (sitting and/or standing);Decreased knowledge of use of DME or AE   OT Treatment/Interventions: Self-care/ADL training;Therapeutic exercise;Therapeutic activities;DME and/or AE instruction;Balance training;Patient/family education;Cognitive remediation/compensation      OT Goals(Current goals can be found in the care plan section)   Acute Rehab OT Goals Patient Stated Goal: go home OT Goal Formulation: With patient Time For Goal Achievement: 07/18/24 Potential to Achieve Goals: Fair ADL Goals Pt Will Perform Upper Body Dressing: sitting;with caregiver independent in  assisting Pt Will Perform Lower Body Dressing: sit to/from stand;with caregiver independent in assisting Pt Will Transfer to Toilet: with contact guard assist;ambulating;bedside commode (LRAD) Pt Will Perform Toileting - Clothing Manipulation and hygiene: sitting/lateral leans;sit to/from stand;with caregiver independent in assisting   OT Frequency:  Min 1X/week    Co-evaluation PT/OT/SLP Co-Evaluation/Treatment: Yes Reason for Co-Treatment: Complexity of the patient's impairments (multi-system involvement);Necessary to address cognition/behavior during functional activity;For patient/therapist safety;To address functional/ADL transfers PT goals addressed during session: Mobility/safety with mobility;Balance OT goals addressed during session: ADL's and self-care      AM-PAC OT 6 Clicks Daily Activity     Outcome Measure Help from another person eating meals?: A Little Help from another person taking care of personal grooming?: A Little Help from another person toileting, which includes using toliet, bedpan, or urinal?: A Lot Help from another person bathing (including washing, rinsing, drying)?: A Lot Help from another person to put on and taking off regular upper body clothing?: A Little Help from another person to put on and taking off regular lower body clothing?: A Lot 6 Click Score: 15   End of Session Nurse Communication: Mobility status  Activity Tolerance: Patient tolerated treatment well Patient left: in bed;with call bell/phone within reach;with bed alarm  set;with family/visitor present  OT Visit Diagnosis: Other abnormalities of gait and mobility (R26.89)                Time: 8870-8847 OT Time Calculation (min): 23 min Charges:  OT General Charges $OT Visit: 1 Visit OT Evaluation $OT Eval Low Complexity: 1 Low  Warren SAUNDERS., MPH, MS, OTR/L ascom [phone] 07/04/24, 2:19 PM

## 2024-07-04 NOTE — ED Provider Notes (Signed)
-----------------------------------------   11:05 AM on 07/04/2024 -----------------------------------------   Blood pressure (!) 150/84, pulse 91, temperature (!) 97.5 F (36.4 C), temperature source Axillary, resp. rate 17, height 5' 6 (1.676 m), weight 75.5 kg, SpO2 96%.  The patient is calm and cooperative at this time.  There have been no acute events since the last update.  Awaiting disposition plan from case management/social work.    Suzanne Kirsch, MD 07/04/24 1105

## 2024-07-04 NOTE — TOC Progression Note (Addendum)
 Transition of Care [facility]) - Progression Note    Patient Details  Name: Jill Parrish MRN: [id number] Date of Birth: 1941-01-19  [facility] CM/SW Contact  Jill KANDICE Bring, RN Phone Number: 07/04/2024, 1:53 PM  Clinical Narrative:    Daughter Jill Parrish at bedside. She states that patient is on level 2 at [facility], which is not memory care at the facility. She  want patient to go to rehab. Therapy had just saw the patient. Cm explained that patient is not admitted to the hospital. CM provided her with Medicare .gov list for SNF for rehab. Cm asked if referral can be sent out daughter was in agreement to referral being sent out to [place], [place] area.  CM completed FL2, waiting for MD signature.  PASRR has been submitted but they are requesting more information   Daughter plan for patient to go back to [facility] when she complete rehab.                     Expected Discharge Plan and Services                                               Social Drivers of Health (SDOH) Interventions SDOH Screenings   Food Insecurity: No Food Insecurity (06/30/2024)  Housing: Low Risk  (06/30/2024)  Transportation Needs: No Transportation Needs (06/30/2024)  Utilities: Not At Risk (06/30/2024)  Financial Resource Strain: Patient Declined (05/10/2024)   Received from [facility]  Social Connections: Socially Isolated (06/30/2024)  Tobacco Use: Low Risk  (07/03/2024)    Readmission Risk Interventions     No data to display

## 2024-07-04 NOTE — ED Notes (Signed)
Pt sleeping at this time. Respirations even and unlabored.

## 2024-07-04 NOTE — ED Notes (Signed)
 Pt eating dinner tray

## 2024-07-04 NOTE — NC FL2 (Signed)
 [place]  MEDICAID FL2 LEVEL OF CARE FORM     IDENTIFICATION  Patient Name: Jill Parrish Birthdate: 1941-07-06 Sex: female Admission Date (Current Location): 07/03/2024  [facility] and [place] Number:  Chiropodist and Address:  [facility], [address], [place], [place] [address]      Provider Number: [id number]  Attending Physician Name and Address:  Suzanne Kirsch, MD  Relative Name and Phone Number:  Sonny Haddock dauhter  [phone]    Current Level of Care: Hospital Recommended Level of Care: Skilled Nursing Facility Prior Approval Number:    Date Approved/Denied:   PASRR Number: Pending # [id number]  Discharge Plan: SNF    Current Diagnoses: Patient Active Problem List   Diagnosis Date Noted   TIA (transient ischemic attack) 06/30/2024   HLD (hyperlipidemia) 06/30/2024   History of stroke 06/30/2024   Dementia without behavioral disturbance (HCC) 06/30/2024   Migraine 06/30/2024   Chronic diastolic CHF (congestive heart failure) (HCC) 06/30/2024   Depression with anxiety 06/30/2024   Anxiety 10/03/2020   Strep throat 10/03/2020   MCI (mild cognitive impairment) with memory loss 04/05/2020   Bilateral sensorineural hearing loss 03/22/2020   Elevated ALT measurement 04/07/2018   Hepatocellular carcinoma (HCC) 04/07/2018   Diabetes mellitus without complication (HCC) 09/29/2017   History of gastroesophageal reflux (GERD) 01/09/2017   GERD (gastroesophageal reflux disease) 12/15/2016   Leg swelling 12/15/2016   Chronic venous insufficiency 12/15/2016   PAD (peripheral artery disease) 12/15/2016   Mixed hyperlipidemia 11/24/2016   Stenosis of left carotid artery 11/24/2016   Cerebrovascular accident (CVA) (HCC) 11/24/2016   Chest pain 11/24/2016   Shortness of breath 11/24/2016   Generalized abdominal pain 11/13/2016   Hx of adenomatous polyp of colon 11/13/2016   Syncope 11/13/2016   Sepsis (HCC) 12/26/2014   Acute  diverticulitis 12/26/2014   Elevated fasting blood sugar 09/21/2014   Pure hypercholesterolemia 09/21/2014   Benign essential hypertension 03/20/2014   Impaired fasting glucose 03/20/2014   Internal nasal lesion 02/11/2014   HTN (hypertension) 12/18/2012   Generalized headaches 12/18/2012   Allergic rhinitis, unspecified 12/24/2011   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   Disequilibrium 01/28/2011    Orientation RESPIRATION BLADDER Height & Weight     Self  Normal Incontinent (Occassional) Weight: 75.5 kg Height:  5' 6 (167.6 cm)  BEHAVIORAL SYMPTOMS/MOOD NEUROLOGICAL BOWEL NUTRITION STATUS      Incontinent (Occassional) Diet  AMBULATORY STATUS COMMUNICATION OF NEEDS Skin   Extensive Assist Verbally Normal                       Personal Care Assistance Level of Assistance  Bathing, Feeding, Dressing Bathing Assistance: Limited assistance Feeding assistance: Independent Dressing Assistance: Limited assistance     Functional Limitations Info  Sight, Hearing, Speech Sight Info: Adequate Hearing Info: Adequate Speech Info: Adequate    SPECIAL CARE FACTORS FREQUENCY  PT (By licensed PT), OT (By licensed OT)     PT Frequency: 5x/week OT Frequency: 5x/week            Contractures Contractures Info: Not present    Additional Factors Info  Code Status, Allergies Code Status Info: Full Allergies Info: Erythromycin, Aspirin , Atorvastatin , Cefprozil, Fexofenadine, Loratadine, Minocycline, Sulfa Antibiotics, Ciprofloxacin , Keflex           Current Medications (07/04/2024):  This is the current hospital active medication list Current Facility-Administered Medications  Medication Dose Route Frequency Provider Last Rate Last Admin   aspirin  chewable tablet  81 mg  81 mg Oral Daily Tan, Ting Xu, MD   81 mg at 07/04/24 9050   clopidogrel  (PLAVIX ) tablet 75 mg  75 mg Oral Daily Tan, Ting Xu, MD   75 mg at 07/04/24 9049   ezetimibe  (ZETIA ) tablet 10 mg  10 mg Oral  Daily Tan, Ting Xu, MD   10 mg at 07/04/24 9048   hydrochlorothiazide  (HYDRODIURIL ) tablet 12.5 mg  12.5 mg Oral Daily Tan, Ting Xu, MD   12.5 mg at 07/04/24 9049   irbesartan  (AVAPRO ) tablet 300 mg  300 mg Oral Daily Tan, Ting Xu, MD   300 mg at 07/04/24 9047   oxybutynin  (DITROPAN ) tablet 5 mg  5 mg Oral QHS Tan, Ting Xu, MD       Current Outpatient Medications  Medication Sig Dispense Refill   acetaminophen  (TYLENOL ) 500 MG tablet Take 500 mg by mouth every 6 (six) hours as needed for mild pain or headache.     aspirin  EC 81 MG tablet Take 1 tablet (81 mg total) by mouth daily. Swallow whole. 30 tablet 12   ATIVAN 0.5 MG tablet Take 0.25 mg by mouth daily as needed.     Cholecalciferol (VITAMIN D-1000 MAX ST) 25 MCG (1000 UT) tablet Take 3,000 Units by mouth daily.     clopidogrel  (PLAVIX ) 75 MG tablet Take 1 tablet (75 mg total) by mouth daily. 90 tablet 0   cyanocobalamin (VITAMIN B12) 1000 MCG tablet Take 1,000 mcg by mouth daily.     ezetimibe  (ZETIA ) 10 MG tablet Take 1 tablet (10 mg total) by mouth daily. 30 tablet 11   hydrochlorothiazide  (HYDRODIURIL ) 12.5 MG tablet Take 12.5 mg by mouth daily.     mirtazapine  (REMERON ) 7.5 MG tablet Take 7.5 mg by mouth at bedtime.     Omega-3 1000 MG CAPS Take 1 capsule by mouth daily.     oxybutynin  (DITROPAN ) 5 MG tablet Take 5 mg by mouth at bedtime.     polyethylene glycol powder (GLYCOLAX /MIRALAX ) 17 GM/SCOOP powder Take 17 g by mouth daily. 507 g 1   telmisartan  (MICARDIS ) 80 MG tablet TAKE 1 TABLET BY MOUTH DAILY.  NEEDS APPT WITH CARDIOLOGY FOR FUTURE REFILLS (Patient taking differently: Take 80 mg by mouth daily at 12 noon.) 15 tablet 0   EPINEPHrine  0.3 mg/0.3 mL IJ SOAJ injection Inject into the muscle. (Patient not taking: Reported on 07/04/2024)       Discharge Medications: Please see discharge summary for a list of discharge medications.  Relevant Imaging Results:  Relevant Lab Results:   Additional Information SSN  [id number]  Jill KANDICE Bring, RN

## 2024-07-04 NOTE — Evaluation (Signed)
 Physical Therapy Evaluation Patient Details Name: Jill Parrish MRN: [id number] DOB: 1941/06/09 Today's Date: 07/04/2024  History of Present Illness  Pt is an 83 y/o F admitted on 07/03/24 after presenting with c/o B knee pain & a fall. PMH: DM, dementia, HLD, HTN, CVA  Clinical Impression  Pt seen for PT evaluation with pt agreeable, daughter present for session. Pt with recent hospital admission but prior to this pt was ambulatory with RW, residing in ALF. On this date, pt requires mod assist +1-2 for bed mobility, min<>mod assist +2 for sit>stand from EOB x3. Pt easily irritated at times, understanding of situation impaired by baseline cognitive deficits. Recommend ongoing PT services to progress mobility as able. Recommend post acute rehab <3 hours therapy/day upon d/c.        If plan is discharge home, recommend the following: A lot of help with walking and/or transfers;A lot of help with bathing/dressing/bathroom;Assistance with feeding;Assistance with cooking/housework;Direct supervision/assist for financial management;Supervision due to cognitive status;Direct supervision/assist for medications management   Can travel by private vehicle   No    Equipment Recommendations Other (comment) (defer to next venue)  Recommendations for Other Services       Functional Status Assessment Patient has had a recent decline in their functional status and demonstrates the ability to make significant improvements in function in a reasonable and predictable amount of time.     Precautions / Restrictions Precautions Precautions: Fall Restrictions Weight Bearing Restrictions Per Provider Order: No      Mobility  Bed Mobility Overal bed mobility: Needs Assistance Bed Mobility: Supine to Sit     Supine to sit: Mod assist, HOB elevated (exit R side of bed) Sit to supine: Mod assist, +2 for physical assistance   General bed mobility comments: +2 to scoot to [facility]    Transfers   Equipment  used: 2 person hand held assist Transfers: Sit to/from Stand Sit to Stand: Min assist, Mod assist, +2 safety/equipment, From elevated surface, +2 physical assistance           General transfer comment: Sit<>stand from elevated ED stretcher, leans posteriorly on EOB with BLE, decreased ability to place feet under BOS despite cuing, sit<>stand x 3 from EOB    Ambulation/Gait Ambulation/Gait assistance: Min assist Gait Distance (Feet):  (1-2) Assistive device: 2 person hand held assist Gait Pattern/deviations: Decreased step length - left, Decreased step length - right, Decreased stride length       General Gait Details: side steps to R along EOB with BUE HHA, cuing re: continuing stepping, min assist for weight shifting, decreased weight shift to LLE  Stairs            Wheelchair Mobility     Tilt Bed    Modified Rankin (Stroke Patients Only)       Balance Overall balance assessment: Needs assistance Sitting-balance support: Feet supported, Bilateral upper extremity supported Sitting balance-Leahy Scale: Poor Sitting balance - Comments: min assist Postural control: Posterior lean Standing balance support: Bilateral upper extremity supported Standing balance-Leahy Scale: Poor                               Pertinent Vitals/Pain Pain Assessment Pain Assessment: Faces Faces Pain Scale: Hurts little more Pain Location: LE Pain Descriptors / Indicators: Discomfort Pain Intervention(s): Repositioned, Limited activity within patient's tolerance, Monitored during session    Home Living Family/patient expects to be discharged to:: Assisted living  Home Equipment: Rollator (4 wheels) Additional Comments: Resident of [facility]    Prior Function               Mobility Comments: Prior to last admission pt was ambulatory with rollator ADLs Comments: assistance from staff for bathing, dressing     Extremity/Trunk  Assessment   Upper Extremity Assessment Upper Extremity Assessment: Overall WFL for tasks assessed    Lower Extremity Assessment Lower Extremity Assessment: Generalized weakness       Communication   Communication Communication: No apparent difficulties    Cognition Arousal: Alert Behavior During Therapy:  (can become irritated easily)   PT - Cognitive impairments: History of cognitive impairments                       PT - Cognition Comments: hx of dementia, oriented to self (name, but not age), oriented to hospital when given choice of 2, able to state current city, knows Christmas is soon Following commands: Impaired Following commands impaired: Follows one step commands inconsistently     Cueing Cueing Techniques: Gestural cues, Tactile cues, Verbal cues     General Comments General comments (skin integrity, edema, etc.): brief soiled with urine, assisted pt with doffing brief    Exercises     Assessment/Plan    PT Assessment Patient needs continued PT services  PT Problem List Decreased strength;Decreased coordination;Pain;Decreased activity tolerance;Decreased cognition;Decreased range of motion;Impaired sensation;Decreased knowledge of use of DME;Decreased balance;Decreased safety awareness;Decreased mobility;Decreased knowledge of precautions       PT Treatment Interventions DME instruction;Balance training;Neuromuscular re-education;Gait training;Functional mobility training;Patient/family education;Therapeutic activities;Therapeutic exercise;Manual techniques    PT Goals (Current goals can be found in the Care Plan section)  Acute Rehab PT Goals Patient Stated Goal: get better PT Goal Formulation: With patient/family Time For Goal Achievement: 07/18/24 Potential to Achieve Goals: Fair    Frequency Min 2X/week     Co-evaluation PT/OT/SLP Co-Evaluation/Treatment: Yes Reason for Co-Treatment: Complexity of the patient's impairments (multi-system  involvement);Necessary to address cognition/behavior during functional activity;For patient/therapist safety;To address functional/ADL transfers PT goals addressed during session: Mobility/safety with mobility;Balance         AM-PAC PT 6 Clicks Mobility  Outcome Measure Help needed turning from your back to your side while in a flat bed without using bedrails?: A Little Help needed moving from lying on your back to sitting on the side of a flat bed without using bedrails?: A Lot Help needed moving to and from a bed to a chair (including a wheelchair)?: A Lot Help needed standing up from a chair using your arms (e.g., wheelchair or bedside chair)?: A Lot Help needed to walk in hospital room?: Total Help needed climbing 3-5 steps with a railing? : Total 6 Click Score: 11    End of Session   Activity Tolerance: Patient tolerated treatment well Patient left: in bed;with call bell/phone within reach;with bed alarm set;with family/visitor present Nurse Communication: Mobility status PT Visit Diagnosis: Muscle weakness (generalized) (M62.81);Difficulty in walking, not elsewhere classified (R26.2);Unsteadiness on feet (R26.81)    Time: 8871-8847 PT Time Calculation (min) (ACUTE ONLY): 24 min   Charges:   PT Evaluation $PT Eval Low Complexity: 1 Low   PT General Charges $$ ACUTE PT VISIT: 1 Visit         Richerd Pinal, PT, DPT 07/04/24, 12:16 PM   Richerd CHRISTELLA Pinal 07/04/2024, 12:14 PM

## 2024-07-04 NOTE — ED Notes (Signed)
 Resting comfortably, no attempts out of bed.

## 2024-07-04 NOTE — ED Notes (Signed)
 PT/OT at bedside.

## 2024-07-05 NOTE — ED Notes (Signed)
 Pt resting in bed at this time. Bed alarm on. Chest rise and fall noted.

## 2024-07-05 NOTE — ED Notes (Signed)
 Attempted to change patient. Pt felt diaper and stated It doesn't feel wet. Attempted to explain to patient that it looked minimally wet. Pt refused to be changed at this time. Pt denies needing to use the bed pan.

## 2024-07-05 NOTE — ED Notes (Signed)
[name] applied. 

## 2024-07-05 NOTE — ED Notes (Signed)
 Family bedside. Pt resting watching tv

## 2024-07-05 NOTE — TOC Progression Note (Signed)
 Transition of Care [facility]) - Progression Note    Patient Details  Name: Jill Parrish MRN: [id number] Date of Birth: 11/01/1940  Transition of Care [facility]) CM/SW Contact  Nathanael CHRISTELLA Ring, RN Phone Number: 07/05/2024, 4:09 PM  Clinical Narrative:     DONALD is back [id number].  Insurance Authorization Approved PlanAuthID:[id number] Dates: 12/2-12/11/2023 Next Review Date: 07/07/2024.  [facility] was able to offer a bed, they do not have a bed on the Rehab side and won't until next week but they can offer a semi private room on the LTC side and she will still get rehab.  Daughter agrees to accept bed offer from [facility].  It is too late today for them to accept her but plan for discharge in the morning.                     Expected Discharge Plan and Services                                               Social Drivers of Health (SDOH) Interventions SDOH Screenings   Food Insecurity: No Food Insecurity (06/30/2024)  Housing: Low Risk  (06/30/2024)  Transportation Needs: No Transportation Needs (06/30/2024)  Utilities: Not At Risk (06/30/2024)  Financial Resource Strain: Patient Declined (05/10/2024)   Received from [facility]  Social Connections: Socially Isolated (06/30/2024)  Tobacco Use: Low Risk  (07/03/2024)    Readmission Risk Interventions     No data to display

## 2024-07-05 NOTE — ED Notes (Signed)
 Supply chain called and asked for ted hose for patient

## 2024-07-05 NOTE — ED Notes (Signed)
 This tech and tech Jordan changed pt and placed new brief and chucks at this time.

## 2024-07-05 NOTE — ED Provider Notes (Signed)
-----------------------------------------   6:45 AM on 07/05/2024 -----------------------------------------   Blood pressure (!) 160/72, pulse 92, temperature 97.8 F (36.6 C), temperature source Axillary, resp. rate 18, height 5' 6 (1.676 m), weight 75.5 kg, SpO2 98%.  The patient is calm and cooperative at this time.  There have been no acute events since the last update.  Awaiting disposition plan from [facility] team.     Jacolyn Pae, MD 07/05/24 [phone]

## 2024-07-05 NOTE — ED Notes (Signed)
 Pt alert resting quietly.  Siderails up x 2.  Pt oriented to self.

## 2024-07-05 NOTE — ED Notes (Signed)
Pt resting with family bedside.

## 2024-07-05 NOTE — ED Notes (Signed)
 Pt resting at this time. Denies complaints

## 2024-07-05 NOTE — TOC Progression Note (Signed)
 Transition of Care [facility]) - Progression Note    Patient Details  Name: Jill Parrish MRN: [id number] Date of Birth: 01-06-1941  Transition of Care [facility]) CM/SW Contact  Jill CHRISTELLA Ring, RN Phone Number: 07/05/2024, 9:15 AM  Clinical Narrative:    Patient has bed offers from [facility], [facility] in [place], and [facility] in [place].  Called to present bed offers to daughter Jill Parrish.  She would like to see if [facility] could offer.  CM contacted Therisa at [facility] and she will take a look at the referral.  Jill Parrish is still pending under manual review.                      Expected Discharge Plan and Services                                               Social Drivers of Health (SDOH) Interventions SDOH Screenings   Food Insecurity: No Food Insecurity (06/30/2024)  Housing: Low Risk  (06/30/2024)  Transportation Needs: No Transportation Needs (06/30/2024)  Utilities: Not At Risk (06/30/2024)  Financial Resource Strain: Patient Declined (05/10/2024)   Received from [facility]  Social Connections: Socially Isolated (06/30/2024)  Tobacco Use: Low Risk  (07/03/2024)    Readmission Risk Interventions     No data to display

## 2024-07-06 NOTE — TOC Transition Note (Signed)
 Transition of Care [facility]) - Discharge Note   Patient Details  Name: DEMIAH Parrish MRN: [id number] Date of Birth: 11-29-1940  Transition of Care [facility]) CM/SW Contact:  Nathanael CHRISTELLA Ring, RN Phone Number: 07/06/2024, 8:32 AM   Clinical Narrative:    Patient will discharge to [facility] this morning.  Just waiting on admissions to give me a room number at [facility].    Final next level of care: Skilled Nursing Facility Barriers to Discharge: Barriers Resolved   Patient Goals and CMS Choice Patient states their goals for this hospitalization and ongoing recovery are:: daughter would like for patient to go to [facility] for rehab CMS Medicare.gov Compare Post Acute Care list provided to:: Patient Represenative (must comment) Choice offered to / list presented to : Adult Children      Discharge Placement PASRR number recieved: 07/05/24            Patient chooses bed at: [facility] Patient to be transferred to facility by: EMS Name of family member notified: Daughter- Sonny Patient and family notified of of transfer: 07/06/24  Discharge Plan and Services Additional resources added to the After Visit Summary for                  DME Arranged: N/A                    Social Drivers of Health (SDOH) Interventions SDOH Screenings   Food Insecurity: No Food Insecurity (06/30/2024)  Housing: Low Risk  (06/30/2024)  Transportation Needs: No Transportation Needs (06/30/2024)  Utilities: Not At Risk (06/30/2024)  Financial Resource Strain: Patient Declined (05/10/2024)   Received from [facility]  Social Connections: Socially Isolated (06/30/2024)  Tobacco Use: Low Risk  (07/03/2024)     Readmission Risk Interventions     No data to display

## 2024-07-06 NOTE — ED Provider Notes (Signed)
 Notified by Nathanael Ring with case management team that patient has discharge plan today to go to [facility].  Will place discharge orders.   Levander Slate, MD 07/06/24 [phone]

## 2024-07-06 NOTE — Discharge Instructions (Signed)
 Continue to take your medications as directed.  Return to the ER for new or worsening symptoms.

## 2024-07-06 NOTE — TOC Transition Note (Signed)
 Transition of Care [facility]) - Discharge Note   Patient Details  Name: JALEIGH MCCROSKEY MRN: [id number] Date of Birth: 1940-10-28  Transition of Care [facility]) CM/SW Contact:  Nathanael CHRISTELLA Ring, RN Phone Number: 07/06/2024, 9:48 AM   Clinical Narrative:    Patient will be going to room 103 at [facility], ED RN will call report to [phone].  Daughter will transport- ED staff will help her get patient into the vehicle.     Final next level of care: Skilled Nursing Facility Barriers to Discharge: Barriers Resolved   Patient Goals and CMS Choice Patient states their goals for this hospitalization and ongoing recovery are:: daughter would like for patient to go to [facility] for rehab CMS Medicare.gov Compare Post Acute Care list provided to:: Patient Represenative (must comment) Choice offered to / list presented to : Adult Children      Discharge Placement PASRR number recieved: 07/05/24            Patient chooses bed at: [facility] Patient to be transferred to facility by: EMS Name of family member notified: Daughter- Sonny Patient and family notified of of transfer: 07/06/24  Discharge Plan and Services Additional resources added to the After Visit Summary for                  DME Arranged: N/A                    Social Drivers of Health (SDOH) Interventions SDOH Screenings   Food Insecurity: No Food Insecurity (06/30/2024)  Housing: Low Risk  (06/30/2024)  Transportation Needs: No Transportation Needs (06/30/2024)  Utilities: Not At Risk (06/30/2024)  Financial Resource Strain: Patient Declined (05/10/2024)   Received from [facility]  Social Connections: Socially Isolated (06/30/2024)  Tobacco Use: Low Risk  (07/03/2024)     Readmission Risk Interventions     No data to display

## 2024-07-06 NOTE — ED Notes (Signed)
 Spoke with daughter, Sonny, and she is on the way to get pt to take to [facility].

## 2024-07-06 NOTE — ED Provider Notes (Signed)
 No acute events overnight, patient continues to await placement at this time.   Jill Parrish [name], MD 07/06/24 [phone]

## 2024-07-06 NOTE — ED Notes (Signed)
 Falls risk bundle in place, bed alarm on, falls risk bracelet in place, non slip socks on pt's feet.

## 2024-07-06 NOTE — ED Notes (Signed)
 Called [facility] to give report on pt. Spoke with [facility].

## 2024-07-06 NOTE — ED Notes (Signed)
 Pt refuses to be checked for urinary incontinence.  Pt refused to turn on side to change position.   Siderails up x 2.

## 2024-07-06 NOTE — ED Notes (Signed)
 Attempted to call daughter, Sonny, and was unsuccessful.

## 2024-08-18 ENCOUNTER — Emergency Department

## 2024-08-18 ENCOUNTER — Emergency Department
Admission: EM | Admit: 2024-08-18 | Discharge: 2024-08-18 | Disposition: A | Discharge status: Home / Self Care | Attending: Emergency Medicine | Admitting: Emergency Medicine

## 2024-08-18 ENCOUNTER — Other Ambulatory Visit: Payer: Self-pay

## 2024-08-18 DIAGNOSIS — E119 Type 2 diabetes mellitus without complications: Secondary | ICD-10-CM | POA: Diagnosis not present

## 2024-08-18 DIAGNOSIS — M7989 Other specified soft tissue disorders: Secondary | ICD-10-CM | POA: Insufficient documentation

## 2024-08-18 DIAGNOSIS — W1839XA Other fall on same level, initial encounter: Secondary | ICD-10-CM | POA: Insufficient documentation

## 2024-08-18 DIAGNOSIS — F0390 Unspecified dementia without behavioral disturbance: Secondary | ICD-10-CM | POA: Insufficient documentation

## 2024-08-18 DIAGNOSIS — S60221A Contusion of right hand, initial encounter: Secondary | ICD-10-CM | POA: Diagnosis not present

## 2024-08-18 DIAGNOSIS — Z8673 Personal history of transient ischemic attack (TIA), and cerebral infarction without residual deficits: Secondary | ICD-10-CM | POA: Diagnosis not present

## 2024-08-18 DIAGNOSIS — I10 Essential (primary) hypertension: Secondary | ICD-10-CM | POA: Diagnosis not present

## 2024-08-18 DIAGNOSIS — S6991XA Unspecified injury of right wrist, hand and finger(s), initial encounter: Secondary | ICD-10-CM | POA: Diagnosis present

## 2024-08-18 LAB — CBC
HCT: 41.1 % (ref 36.0–46.0)
Hemoglobin: 13.6 g/dL (ref 12.0–15.0)
MCH: 30.2 pg (ref 26.0–34.0)
MCHC: 33.1 g/dL (ref 30.0–36.0)
MCV: 91.3 fL (ref 80.0–100.0)
Platelets: 359 K/uL (ref 150–400)
RBC: 4.5 MIL/uL (ref 3.87–5.11)
RDW: 13 % (ref 11.5–15.5)
WBC: 6.8 K/uL (ref 4.0–10.5)
nRBC: 0 % (ref 0.0–0.2)

## 2024-08-18 LAB — BASIC METABOLIC PANEL WITH GFR
Anion gap: 11 (ref 5–15)
BUN: 14 mg/dL (ref 8–23)
CO2: 29 mmol/L (ref 22–32)
Calcium: 9.6 mg/dL (ref 8.9–10.3)
Chloride: 102 mmol/L (ref 98–111)
Creatinine, Ser: 0.9 mg/dL (ref 0.44–1.00)
GFR, Estimated: >60 mL/min
Glucose, Bld: 127 mg/dL — ABNORMAL HIGH (ref 70–99)
Potassium: 3.3 mmol/L — ABNORMAL LOW (ref 3.5–5.1)
Sodium: 142 mmol/L (ref 135–145)

## 2024-08-18 NOTE — Discharge Instructions (Addendum)
 Your ultrasound did not show a DVT today.  However if this swelling continues for 1 week you should have a repeat ultrasound done at that time. We were unable to get urine from the patient today. Depakote level is pending.  This is a send out.  You can review this in her [facility] MyChart X-ray of her hand/wrist was normal The urine does smell strong, recommend the ability get a UA to be sent for urinalysis and culture.  Then the NP likely help to treat her for UTI

## 2024-08-18 NOTE — ED Notes (Signed)
 LG, lav, red top sent to lab

## 2024-08-18 NOTE — ED Triage Notes (Signed)
 Pt to ED [facility] from [facility] for left leg swelling over past few days. Possible injury to left wrist. Facility also reports concern for UTI, hx same. Hx dementia.  Family also reports pt recently started on depakote and facility unable to get blood level, asking if can be drawn here.

## 2024-08-18 NOTE — ED Provider Notes (Signed)
 "  [facility] Provider Note    Event Date/Time   First MD Initiated Contact with Patient 08/18/24 1604     (approximate)   History   Leg Swelling and Wrist Pain   HPI  Jill Parrish is a 84 y.o. female history of hypertension, TIA, diabetes, CVA presents emergency department from [facility] with her daughter complaining of left leg swelling.  Also had a fall and is complaining about wrist pain.  Daughter is also concerned that she might have a UTI and would also like to get a Depakote level ordered as they were unable to draw blood at the facility.      Physical Exam   Triage Vital Signs: ED Triage Vitals  Encounter Vitals Group     BP 08/18/24 1314 (!) 134/59     Girls Systolic BP Percentile --      Girls Diastolic BP Percentile --      Boys Systolic BP Percentile --      Boys Diastolic BP Percentile --      Pulse Rate 08/18/24 1314 87     Resp 08/18/24 1314 18     Temp 08/18/24 1314 97.7 F (36.5 C)     Temp src --      SpO2 08/18/24 1314 97 %     Weight 08/18/24 1315 160 lb (72.6 kg)     Height 08/18/24 1315 5' 6 (1.676 m)     Head Circumference --      Peak Flow --      Pain Score --      Pain Loc --      Pain Education --      Exclude from Growth Chart --     Most recent vital signs: Vitals:   08/18/24 1314  BP: (!) 134/59  Pulse: 87  Resp: 18  Temp: 97.7 F (36.5 C)  SpO2: 97%     General: Awake, no distress.   CV:  Good peripheral perfusion. Resp:  Normal effort.  Abd:  No distention.   Other:  Left leg with swelling noted, right wrist/hand with bruising noted dorsally   ED Results / Procedures / Treatments   Labs (all labs ordered are listed, but only abnormal results are displayed) Labs Reviewed  BASIC METABOLIC PANEL WITH GFR - Abnormal; Notable for the following components:      Result Value   Potassium 3.3 (*)    Glucose, Bld 127 (*)    All other components within normal limits  CBC  URINALYSIS,  ROUTINE W REFLEX MICROSCOPIC  VALPROIC ACID LEVEL     EKG     RADIOLOGY Ultrasound left lower extremity for DVT, x-ray left wrist    PROCEDURES:   Procedures  Critical Care:  no Chief Complaint  Patient presents with   Leg Swelling   Wrist Pain      MEDICATIONS ORDERED IN ED: Medications - No data to display   IMPRESSION / MDM / ASSESSMENT AND PLAN / ED COURSE  I reviewed the triage vital signs and the nursing notes.                              Differential diagnosis includes, but is not limited to, DVT, edema, fracture, contusion, UTI  Patient's presentation is most consistent with acute illness / injury with system symptoms.   Patient's labs are reassuring, Ultrasound left lower extremity for DVT, independent review interpretation by  me as being negative for any acute abnormality X-ray left wrist independent review and interpretation by me as being negative for fracture  The patient was placed on the bedside commode and sat there for quite a while.  Daughter was a little frustrated that she would not urinate.  They are refusing In-N-Out cath.  Daughter states she feels that the facility could obtain a urinalysis for her so that the NP could prescribe antibiotics if needed.  Grateful that we were able to order the Depakote level.  Explained to her that this would not result until tomorrow.  Since the DVT was ruled out and fracture is ruled out at this time and patient appears to be at baseline with her dementia we will go ahead and discharge her back to [facility] via her daughter.  Information sent them likely home stating they should obtain a urinalysis and urine culture.  The daughter is in agreement with this treatment plan.  Patient is discharged stable condition.  Strict instructions to return if worsening      FINAL CLINICAL IMPRESSION(S) / ED DIAGNOSES   Final diagnoses:  Left leg swelling  Contusion of right hand, initial encounter     Rx / DC  Orders   ED Discharge Orders     None        Note:  This document was prepared using Dragon voice recognition software and may include unintentional dictation errors.    Gasper Devere ORN, PA-C 08/18/24 IRVEN Willo Dunnings, MD 08/18/24 [phone]  "

## 2024-08-21 ENCOUNTER — Emergency Department

## 2024-08-21 ENCOUNTER — Other Ambulatory Visit: Payer: Self-pay

## 2024-08-21 ENCOUNTER — Emergency Department
Admission: EM | Admit: 2024-08-21 | Discharge: 2024-08-21 | Disposition: A | Discharge status: Home / Self Care | Attending: Emergency Medicine | Admitting: Emergency Medicine

## 2024-08-21 DIAGNOSIS — E876 Hypokalemia: Secondary | ICD-10-CM | POA: Insufficient documentation

## 2024-08-21 DIAGNOSIS — W19XXXA Unspecified fall, initial encounter: Secondary | ICD-10-CM | POA: Insufficient documentation

## 2024-08-21 DIAGNOSIS — R600 Localized edema: Secondary | ICD-10-CM | POA: Diagnosis present

## 2024-08-21 DIAGNOSIS — F0390 Unspecified dementia without behavioral disturbance: Secondary | ICD-10-CM | POA: Diagnosis not present

## 2024-08-21 LAB — COMPREHENSIVE METABOLIC PANEL WITH GFR
ALT: 12 U/L (ref 0–44)
AST: 19 U/L (ref 15–41)
Albumin: 3.7 g/dL (ref 3.5–5.0)
Alkaline Phosphatase: 99 U/L (ref 38–126)
Anion gap: 12 (ref 5–15)
BUN: 22 mg/dL (ref 8–23)
CO2: 27 mmol/L (ref 22–32)
Calcium: 9.3 mg/dL (ref 8.9–10.3)
Chloride: 105 mmol/L (ref 98–111)
Creatinine, Ser: 0.97 mg/dL (ref 0.44–1.00)
GFR, Estimated: 57 mL/min — ABNORMAL LOW
Glucose, Bld: 148 mg/dL — ABNORMAL HIGH (ref 70–99)
Potassium: 3.3 mmol/L — ABNORMAL LOW (ref 3.5–5.1)
Sodium: 144 mmol/L (ref 135–145)
Total Bilirubin: 0.5 mg/dL (ref 0.0–1.2)
Total Protein: 6.3 g/dL — ABNORMAL LOW (ref 6.5–8.1)

## 2024-08-21 LAB — CBC
HCT: 39.4 % (ref 36.0–46.0)
Hemoglobin: 12.8 g/dL (ref 12.0–15.0)
MCH: 30 pg (ref 26.0–34.0)
MCHC: 32.5 g/dL (ref 30.0–36.0)
MCV: 92.3 fL (ref 80.0–100.0)
Platelets: 388 K/uL (ref 150–400)
RBC: 4.27 MIL/uL (ref 3.87–5.11)
RDW: 13.2 % (ref 11.5–15.5)
WBC: 6.8 K/uL (ref 4.0–10.5)
nRBC: 0 % (ref 0.0–0.2)

## 2024-08-21 LAB — CBG MONITORING, ED: Glucose-Capillary: 126 mg/dL — ABNORMAL HIGH (ref 70–99)

## 2024-08-21 NOTE — ED Triage Notes (Signed)
 Pt to ED from [facility] for unwitnessed fall today. Pt was complaining of neck and back pain after the fall. Pt has dementia.  EMS VS: 168/75, hr 80s, 98% RA. Pt denies pain. Has several areas of bruising to R arm.

## 2024-08-21 NOTE — ED Provider Notes (Signed)
 "  [facility] Provider Note    Event Date/Time   First MD Initiated Contact with Patient 08/21/24 1645     (approximate)   History   Fall   HPI  Jill Parrish is a 84 y.o. female with a history of dementia presents after reported fall.  Fall was apparently unwitnessed and she was apparently complaining of neck and back pain after the fall however has no complaints at this time.  Based on the CT head and cervical spine were ordered.   Daughter is here now and reports she has a camera in the patient's room and reports the fall was relatively minor without head injury   Physical Exam   Triage Vital Signs: ED Triage Vitals  Encounter Vitals Group     BP 08/21/24 1639 120/64     Girls Systolic BP Percentile --      Girls Diastolic BP Percentile --      Boys Systolic BP Percentile --      Boys Diastolic BP Percentile --      Pulse Rate 08/21/24 1639 70     Resp 08/21/24 1639 20     Temp 08/21/24 1639 98.3 F (36.8 C)     Temp Source 08/21/24 1639 Oral     SpO2 08/21/24 1639 100 %     Weight 08/21/24 1636 72.5 kg (159 lb 13.3 oz)     Height 08/21/24 1636 1.676 m (5' 6)     Head Circumference --      Peak Flow --      Pain Score 08/21/24 1745 0     Pain Loc --      Pain Education --      Exclude from Growth Chart --     Most recent vital signs: Vitals:   08/21/24 1639  BP: 120/64  Pulse: 70  Resp: 20  Temp: 98.3 F (36.8 C)  SpO2: 100%     General: Awake, no distress.  No evidence of head injury CV:  Good peripheral perfusion.  No chest wall tenderness palpation Resp:  Normal effort.  Abd:  No distention.  Soft, nontender Other:  Good range of motion of all extremities, no vertebral tenderness to palpation 1+ edema primarily to the left leg, mild edema to the right leg, no tenderness or erythema   ED Results / Procedures / Treatments   Labs (all labs ordered are listed, but only abnormal results are displayed) Labs Reviewed   COMPREHENSIVE METABOLIC PANEL WITH GFR - Abnormal; Notable for the following components:      Result Value   Potassium 3.3 (*)    Glucose, Bld 148 (*)    Total Protein 6.3 (*)    GFR, Estimated 57 (*)    All other components within normal limits  CBG MONITORING, ED - Abnormal; Notable for the following components:   Glucose-Capillary 126 (*)    All other components within normal limits  CBC  URINALYSIS, ROUTINE W REFLEX MICROSCOPIC     EKG     RADIOLOGY  CT head and cervical spine are unremarkable   PROCEDURES:  Critical Care performed:   Procedures   MEDICATIONS ORDERED IN ED: Medications - No data to display   IMPRESSION / MDM / ASSESSMENT AND PLAN / ED COURSE  I reviewed the triage vital signs and the nursing notes. Patient's presentation is most consistent with acute illness / injury with system symptoms.  Patient presents after unwitnessed fall as detailed above, history limited  by dementia.  Overall reassuring exam  Imaging is unremarkable, lab work obtained which is reassuring, patient's daughter describes relatively minor fall, given reassuring exam and imaging appropriate for discharge at this time with outpatient follow-up.        FINAL CLINICAL IMPRESSION(S) / ED DIAGNOSES   Final diagnoses:  Fall, initial encounter  Peripheral edema     Rx / DC Orders   ED Discharge Orders     None        Note:  This document was prepared using Dragon voice recognition software and may include unintentional dictation errors.   Arlander Charleston, MD 08/21/24 [phone]  "
